# Patient Record
Sex: Female | Born: 1955 | Race: White | Hispanic: No | Marital: Married | Smoking: Former smoker
Health system: Southern US, Community
[De-identification: ages and names within clinical notes are randomized; demographics above are authoritative.]

## PROBLEM LIST (undated history)

## (undated) DIAGNOSIS — E039 Hypothyroidism, unspecified: Secondary | ICD-10-CM

## (undated) DIAGNOSIS — I2510 Atherosclerotic heart disease of native coronary artery without angina pectoris: Secondary | ICD-10-CM

## (undated) DIAGNOSIS — F32A Depression, unspecified: Secondary | ICD-10-CM

## (undated) DIAGNOSIS — E785 Hyperlipidemia, unspecified: Secondary | ICD-10-CM

## (undated) DIAGNOSIS — F329 Major depressive disorder, single episode, unspecified: Secondary | ICD-10-CM

## (undated) DIAGNOSIS — F909 Attention-deficit hyperactivity disorder, unspecified type: Secondary | ICD-10-CM

## (undated) DIAGNOSIS — C801 Malignant (primary) neoplasm, unspecified: Secondary | ICD-10-CM

## (undated) DIAGNOSIS — M8580 Other specified disorders of bone density and structure, unspecified site: Secondary | ICD-10-CM

## (undated) HISTORY — DX: Attention-deficit hyperactivity disorder, unspecified type: F90.9

## (undated) HISTORY — PX: BREAST BIOPSY: SHX20

## (undated) HISTORY — DX: Atherosclerotic heart disease of native coronary artery without angina pectoris: I25.10

## (undated) HISTORY — PX: BREAST EXCISIONAL BIOPSY: SUR124

## (undated) HISTORY — PX: APPENDECTOMY: SHX54

## (undated) HISTORY — DX: Depression, unspecified: F32.A

## (undated) HISTORY — DX: Hyperlipidemia, unspecified: E78.5

## (undated) HISTORY — PX: ABDOMINAL HYSTERECTOMY: SHX81

## (undated) HISTORY — DX: Other specified disorders of bone density and structure, unspecified site: M85.80

## (undated) HISTORY — DX: Hypothyroidism, unspecified: E03.9

## (undated) HISTORY — DX: Major depressive disorder, single episode, unspecified: F32.9

---

## 1985-06-12 HISTORY — PX: TOTAL ABDOMINAL HYSTERECTOMY W/ BILATERAL SALPINGOOPHORECTOMY: SHX83

## 1997-06-12 HISTORY — PX: CHOLECYSTECTOMY: SHX55

## 2005-02-24 ENCOUNTER — Ambulatory Visit: Payer: Self-pay | Admitting: Family Medicine

## 2005-03-23 ENCOUNTER — Ambulatory Visit: Payer: Self-pay | Admitting: Family Medicine

## 2005-07-06 ENCOUNTER — Ambulatory Visit: Payer: Self-pay | Admitting: Family Medicine

## 2005-11-30 ENCOUNTER — Ambulatory Visit: Payer: Self-pay | Admitting: Family Medicine

## 2006-05-31 ENCOUNTER — Ambulatory Visit: Payer: Self-pay | Admitting: Family Medicine

## 2006-08-23 ENCOUNTER — Ambulatory Visit: Payer: Self-pay | Admitting: Family Medicine

## 2006-08-23 LAB — CONVERTED CEMR LAB
Albumin: 4.2 g/dL (ref 3.5–5.2)
Basophils Absolute: 0.1 10*3/uL (ref 0.0–0.1)
CO2: 30 meq/L (ref 19–32)
Cholesterol: 215 mg/dL (ref 0–200)
Creatinine, Ser: 0.8 mg/dL (ref 0.4–1.2)
Direct LDL: 143.2 mg/dL
GFR calc Af Amer: 98 mL/min
HCT: 42.8 % (ref 36.0–46.0)
Hemoglobin: 14.6 g/dL (ref 12.0–15.0)
Lymphocytes Relative: 30 % (ref 12.0–46.0)
MCHC: 34.2 g/dL (ref 30.0–36.0)
Monocytes Absolute: 0.4 10*3/uL (ref 0.2–0.7)
Neutro Abs: 3.9 10*3/uL (ref 1.4–7.7)
Neutrophils Relative %: 61 % (ref 43.0–77.0)
Potassium: 3.8 meq/L (ref 3.5–5.1)
RDW: 12 % (ref 11.5–14.6)
Sodium: 142 meq/L (ref 135–145)
TSH: 0.47 microintl units/mL (ref 0.35–5.50)
Total Bilirubin: 0.5 mg/dL (ref 0.3–1.2)
Total CHOL/HDL Ratio: 4.5
Total Protein: 7.2 g/dL (ref 6.0–8.3)

## 2006-11-12 ENCOUNTER — Telehealth: Payer: Self-pay | Admitting: Family Medicine

## 2006-12-10 ENCOUNTER — Telehealth (INDEPENDENT_AMBULATORY_CARE_PROVIDER_SITE_OTHER): Payer: Self-pay | Admitting: *Deleted

## 2007-01-08 ENCOUNTER — Telehealth (INDEPENDENT_AMBULATORY_CARE_PROVIDER_SITE_OTHER): Payer: Self-pay | Admitting: *Deleted

## 2007-02-05 ENCOUNTER — Telehealth (INDEPENDENT_AMBULATORY_CARE_PROVIDER_SITE_OTHER): Payer: Self-pay | Admitting: *Deleted

## 2007-03-04 ENCOUNTER — Telehealth (INDEPENDENT_AMBULATORY_CARE_PROVIDER_SITE_OTHER): Payer: Self-pay | Admitting: *Deleted

## 2007-03-29 ENCOUNTER — Telehealth (INDEPENDENT_AMBULATORY_CARE_PROVIDER_SITE_OTHER): Payer: Self-pay | Admitting: *Deleted

## 2007-04-30 ENCOUNTER — Ambulatory Visit: Payer: Self-pay | Admitting: Family Medicine

## 2007-04-30 DIAGNOSIS — F988 Other specified behavioral and emotional disorders with onset usually occurring in childhood and adolescence: Secondary | ICD-10-CM | POA: Insufficient documentation

## 2007-04-30 DIAGNOSIS — E039 Hypothyroidism, unspecified: Secondary | ICD-10-CM | POA: Insufficient documentation

## 2007-05-07 ENCOUNTER — Encounter: Payer: Self-pay | Admitting: Family Medicine

## 2007-05-27 ENCOUNTER — Telehealth (INDEPENDENT_AMBULATORY_CARE_PROVIDER_SITE_OTHER): Payer: Self-pay | Admitting: *Deleted

## 2007-06-26 ENCOUNTER — Telehealth (INDEPENDENT_AMBULATORY_CARE_PROVIDER_SITE_OTHER): Payer: Self-pay | Admitting: *Deleted

## 2007-07-18 ENCOUNTER — Encounter: Payer: Self-pay | Admitting: Family Medicine

## 2007-07-24 ENCOUNTER — Telehealth (INDEPENDENT_AMBULATORY_CARE_PROVIDER_SITE_OTHER): Payer: Self-pay | Admitting: *Deleted

## 2007-08-20 ENCOUNTER — Other Ambulatory Visit: Admission: RE | Admit: 2007-08-20 | Discharge: 2007-08-20 | Payer: Self-pay | Admitting: Family Medicine

## 2007-08-20 ENCOUNTER — Encounter: Payer: Self-pay | Admitting: Family Medicine

## 2007-08-20 ENCOUNTER — Ambulatory Visit: Payer: Self-pay | Admitting: Family Medicine

## 2007-08-20 DIAGNOSIS — M810 Age-related osteoporosis without current pathological fracture: Secondary | ICD-10-CM

## 2007-08-21 ENCOUNTER — Encounter (INDEPENDENT_AMBULATORY_CARE_PROVIDER_SITE_OTHER): Payer: Self-pay | Admitting: *Deleted

## 2007-08-26 ENCOUNTER — Encounter (INDEPENDENT_AMBULATORY_CARE_PROVIDER_SITE_OTHER): Payer: Self-pay | Admitting: *Deleted

## 2007-09-02 ENCOUNTER — Encounter (INDEPENDENT_AMBULATORY_CARE_PROVIDER_SITE_OTHER): Payer: Self-pay | Admitting: *Deleted

## 2007-09-17 ENCOUNTER — Telehealth (INDEPENDENT_AMBULATORY_CARE_PROVIDER_SITE_OTHER): Payer: Self-pay | Admitting: *Deleted

## 2007-09-17 ENCOUNTER — Encounter: Payer: Self-pay | Admitting: Family Medicine

## 2007-09-17 ENCOUNTER — Ambulatory Visit: Payer: Self-pay | Admitting: Internal Medicine

## 2007-09-27 ENCOUNTER — Telehealth (INDEPENDENT_AMBULATORY_CARE_PROVIDER_SITE_OTHER): Payer: Self-pay | Admitting: *Deleted

## 2007-10-21 ENCOUNTER — Telehealth (INDEPENDENT_AMBULATORY_CARE_PROVIDER_SITE_OTHER): Payer: Self-pay | Admitting: *Deleted

## 2007-11-18 ENCOUNTER — Telehealth (INDEPENDENT_AMBULATORY_CARE_PROVIDER_SITE_OTHER): Payer: Self-pay | Admitting: *Deleted

## 2007-12-11 ENCOUNTER — Encounter: Payer: Self-pay | Admitting: Family Medicine

## 2007-12-17 ENCOUNTER — Telehealth (INDEPENDENT_AMBULATORY_CARE_PROVIDER_SITE_OTHER): Payer: Self-pay | Admitting: *Deleted

## 2008-01-15 ENCOUNTER — Telehealth (INDEPENDENT_AMBULATORY_CARE_PROVIDER_SITE_OTHER): Payer: Self-pay | Admitting: *Deleted

## 2008-02-10 ENCOUNTER — Telehealth (INDEPENDENT_AMBULATORY_CARE_PROVIDER_SITE_OTHER): Payer: Self-pay | Admitting: *Deleted

## 2008-03-05 ENCOUNTER — Telehealth (INDEPENDENT_AMBULATORY_CARE_PROVIDER_SITE_OTHER): Payer: Self-pay | Admitting: *Deleted

## 2008-04-29 ENCOUNTER — Telehealth (INDEPENDENT_AMBULATORY_CARE_PROVIDER_SITE_OTHER): Payer: Self-pay | Admitting: *Deleted

## 2008-05-13 ENCOUNTER — Ambulatory Visit: Payer: Self-pay | Admitting: Family Medicine

## 2008-05-13 DIAGNOSIS — M7710 Lateral epicondylitis, unspecified elbow: Secondary | ICD-10-CM | POA: Insufficient documentation

## 2008-06-15 ENCOUNTER — Ambulatory Visit: Payer: Self-pay | Admitting: Family Medicine

## 2008-06-29 ENCOUNTER — Encounter: Payer: Self-pay | Admitting: Family Medicine

## 2008-08-17 ENCOUNTER — Telehealth: Payer: Self-pay | Admitting: Family Medicine

## 2008-08-24 ENCOUNTER — Ambulatory Visit: Payer: Self-pay | Admitting: Family Medicine

## 2008-08-26 ENCOUNTER — Encounter (INDEPENDENT_AMBULATORY_CARE_PROVIDER_SITE_OTHER): Payer: Self-pay | Admitting: *Deleted

## 2008-09-09 ENCOUNTER — Telehealth (INDEPENDENT_AMBULATORY_CARE_PROVIDER_SITE_OTHER): Payer: Self-pay | Admitting: *Deleted

## 2008-10-05 ENCOUNTER — Telehealth (INDEPENDENT_AMBULATORY_CARE_PROVIDER_SITE_OTHER): Payer: Self-pay | Admitting: *Deleted

## 2008-10-09 ENCOUNTER — Telehealth (INDEPENDENT_AMBULATORY_CARE_PROVIDER_SITE_OTHER): Payer: Self-pay | Admitting: *Deleted

## 2008-11-10 ENCOUNTER — Telehealth (INDEPENDENT_AMBULATORY_CARE_PROVIDER_SITE_OTHER): Payer: Self-pay | Admitting: *Deleted

## 2008-12-09 ENCOUNTER — Telehealth (INDEPENDENT_AMBULATORY_CARE_PROVIDER_SITE_OTHER): Payer: Self-pay | Admitting: *Deleted

## 2009-01-06 ENCOUNTER — Telehealth (INDEPENDENT_AMBULATORY_CARE_PROVIDER_SITE_OTHER): Payer: Self-pay | Admitting: *Deleted

## 2009-01-22 ENCOUNTER — Telehealth: Payer: Self-pay | Admitting: Family Medicine

## 2009-01-22 ENCOUNTER — Emergency Department (HOSPITAL_COMMUNITY): Admission: EM | Admit: 2009-01-22 | Discharge: 2009-01-22 | Payer: Self-pay | Admitting: Emergency Medicine

## 2009-01-22 ENCOUNTER — Encounter: Payer: Self-pay | Admitting: Cardiovascular Disease

## 2009-01-27 ENCOUNTER — Ambulatory Visit: Payer: Self-pay | Admitting: Cardiovascular Disease

## 2009-01-27 ENCOUNTER — Encounter (INDEPENDENT_AMBULATORY_CARE_PROVIDER_SITE_OTHER): Payer: Self-pay | Admitting: *Deleted

## 2009-01-27 DIAGNOSIS — R42 Dizziness and giddiness: Secondary | ICD-10-CM

## 2009-01-27 DIAGNOSIS — R072 Precordial pain: Secondary | ICD-10-CM

## 2009-01-27 DIAGNOSIS — F17200 Nicotine dependence, unspecified, uncomplicated: Secondary | ICD-10-CM

## 2009-01-27 DIAGNOSIS — R079 Chest pain, unspecified: Secondary | ICD-10-CM

## 2009-01-27 DIAGNOSIS — R002 Palpitations: Secondary | ICD-10-CM

## 2009-01-28 ENCOUNTER — Encounter: Payer: Self-pay | Admitting: Cardiovascular Disease

## 2009-02-02 ENCOUNTER — Telehealth (INDEPENDENT_AMBULATORY_CARE_PROVIDER_SITE_OTHER): Payer: Self-pay | Admitting: *Deleted

## 2009-02-04 ENCOUNTER — Encounter: Payer: Self-pay | Admitting: Cardiovascular Disease

## 2009-02-04 ENCOUNTER — Ambulatory Visit: Payer: Self-pay

## 2009-02-04 ENCOUNTER — Ambulatory Visit: Payer: Self-pay | Admitting: Cardiology

## 2009-02-05 ENCOUNTER — Encounter: Payer: Self-pay | Admitting: Cardiology

## 2009-02-05 ENCOUNTER — Telehealth (INDEPENDENT_AMBULATORY_CARE_PROVIDER_SITE_OTHER): Payer: Self-pay | Admitting: *Deleted

## 2009-02-05 ENCOUNTER — Ambulatory Visit: Payer: Self-pay | Admitting: Cardiovascular Disease

## 2009-02-09 ENCOUNTER — Encounter: Payer: Self-pay | Admitting: Cardiovascular Disease

## 2009-02-09 ENCOUNTER — Ambulatory Visit: Payer: Self-pay

## 2009-02-12 ENCOUNTER — Encounter (INDEPENDENT_AMBULATORY_CARE_PROVIDER_SITE_OTHER): Payer: Self-pay | Admitting: *Deleted

## 2009-02-12 ENCOUNTER — Ambulatory Visit: Payer: Self-pay | Admitting: Cardiovascular Disease

## 2009-02-12 DIAGNOSIS — R9439 Abnormal result of other cardiovascular function study: Secondary | ICD-10-CM | POA: Insufficient documentation

## 2009-02-16 ENCOUNTER — Inpatient Hospital Stay (HOSPITAL_BASED_OUTPATIENT_CLINIC_OR_DEPARTMENT_OTHER): Admission: RE | Admit: 2009-02-16 | Discharge: 2009-02-16 | Payer: Self-pay | Admitting: Cardiovascular Disease

## 2009-02-16 ENCOUNTER — Ambulatory Visit: Payer: Self-pay | Admitting: Cardiovascular Disease

## 2009-02-16 LAB — CONVERTED CEMR LAB
Calcium: 9.9 mg/dL (ref 8.4–10.5)
Chloride: 105 meq/L (ref 96–112)
Creatinine, Ser: 0.75 mg/dL (ref 0.40–1.20)
Free T4: 0.81 ng/dL (ref 0.80–1.80)
Hemoglobin: 14 g/dL (ref 12.0–15.0)
INR: 1 (ref 0.0–1.5)
MCHC: 31.7 g/dL (ref 30.0–36.0)
Prothrombin Time: 12.6 s (ref 11.6–15.2)
RBC: 5 M/uL (ref 3.87–5.11)
RDW: 14.2 % (ref 11.5–15.5)
Sodium: 143 meq/L (ref 135–145)
T3, Free: 3.2 pg/mL (ref 2.3–4.2)

## 2009-02-23 ENCOUNTER — Telehealth: Payer: Self-pay | Admitting: Family Medicine

## 2009-02-24 ENCOUNTER — Ambulatory Visit: Payer: Self-pay | Admitting: Cardiovascular Disease

## 2009-02-24 DIAGNOSIS — R0602 Shortness of breath: Secondary | ICD-10-CM | POA: Insufficient documentation

## 2009-03-30 ENCOUNTER — Encounter (INDEPENDENT_AMBULATORY_CARE_PROVIDER_SITE_OTHER): Payer: Self-pay | Admitting: *Deleted

## 2009-03-30 ENCOUNTER — Ambulatory Visit: Payer: Self-pay | Admitting: Family Medicine

## 2009-03-30 DIAGNOSIS — E559 Vitamin D deficiency, unspecified: Secondary | ICD-10-CM

## 2009-03-31 ENCOUNTER — Encounter (INDEPENDENT_AMBULATORY_CARE_PROVIDER_SITE_OTHER): Payer: Self-pay | Admitting: *Deleted

## 2009-03-31 LAB — CONVERTED CEMR LAB: Vit D, 25-Hydroxy: 41 ng/mL (ref 30–89)

## 2009-04-01 ENCOUNTER — Encounter: Payer: Self-pay | Admitting: Family Medicine

## 2009-04-08 ENCOUNTER — Ambulatory Visit: Payer: Self-pay | Admitting: Internal Medicine

## 2009-04-09 ENCOUNTER — Encounter: Payer: Self-pay | Admitting: Family Medicine

## 2009-04-22 ENCOUNTER — Telehealth (INDEPENDENT_AMBULATORY_CARE_PROVIDER_SITE_OTHER): Payer: Self-pay | Admitting: *Deleted

## 2009-06-18 ENCOUNTER — Ambulatory Visit: Payer: Self-pay | Admitting: Family Medicine

## 2009-06-18 DIAGNOSIS — M545 Low back pain, unspecified: Secondary | ICD-10-CM | POA: Insufficient documentation

## 2009-06-18 DIAGNOSIS — M2550 Pain in unspecified joint: Secondary | ICD-10-CM | POA: Insufficient documentation

## 2009-06-18 LAB — CONVERTED CEMR LAB
Ketones, urine, test strip: NEGATIVE
Nitrite: NEGATIVE
Urobilinogen, UA: 0.2
WBC Urine, dipstick: NEGATIVE

## 2009-06-22 ENCOUNTER — Encounter (INDEPENDENT_AMBULATORY_CARE_PROVIDER_SITE_OTHER): Payer: Self-pay | Admitting: *Deleted

## 2009-06-22 LAB — CONVERTED CEMR LAB: Anti Nuclear Antibody(ANA): NEGATIVE

## 2009-06-24 ENCOUNTER — Telehealth: Payer: Self-pay | Admitting: Family Medicine

## 2009-06-24 ENCOUNTER — Ambulatory Visit: Payer: Self-pay | Admitting: Family Medicine

## 2009-06-24 LAB — CONVERTED CEMR LAB
ALT: 17 units/L (ref 0–35)
AST: 21 units/L (ref 0–37)
Albumin: 3.9 g/dL (ref 3.5–5.2)
Alkaline Phosphatase: 106 units/L (ref 39–117)
Basophils Absolute: 0.1 10*3/uL (ref 0.0–0.1)
Calcium: 9.4 mg/dL (ref 8.4–10.5)
Direct LDL: 170.9 mg/dL
Eosinophils Relative: 2.1 % (ref 0.0–5.0)
GFR calc non Af Amer: 93.37 mL/min (ref >60)
HDL: 41.8 mg/dL (ref >39.00)
Hemoglobin: 13.5 g/dL (ref 12.0–15.0)
Lymphocytes Relative: 28.7 % (ref 12.0–46.0)
Monocytes Relative: 7.8 % (ref 3.0–12.0)
Neutro Abs: 4.5 10*3/uL (ref 1.4–7.7)
RBC: 4.62 M/uL (ref 3.87–5.11)
RDW: 12.1 % (ref 11.5–14.6)
Sodium: 139 meq/L (ref 135–145)
Total CHOL/HDL Ratio: 6
Triglycerides: 145 mg/dL (ref 0.0–149.0)
VLDL: 29 mg/dL (ref 0.0–40.0)
WBC: 7.6 10*3/uL (ref 4.5–10.5)

## 2009-06-25 ENCOUNTER — Telehealth (INDEPENDENT_AMBULATORY_CARE_PROVIDER_SITE_OTHER): Payer: Self-pay | Admitting: *Deleted

## 2009-06-26 ENCOUNTER — Encounter: Admission: RE | Admit: 2009-06-26 | Discharge: 2009-06-26 | Payer: Self-pay | Admitting: Family Medicine

## 2009-06-28 ENCOUNTER — Telehealth (INDEPENDENT_AMBULATORY_CARE_PROVIDER_SITE_OTHER): Payer: Self-pay | Admitting: *Deleted

## 2009-07-07 ENCOUNTER — Encounter: Payer: Self-pay | Admitting: Family Medicine

## 2009-07-08 ENCOUNTER — Telehealth (INDEPENDENT_AMBULATORY_CARE_PROVIDER_SITE_OTHER): Payer: Self-pay | Admitting: *Deleted

## 2009-07-19 ENCOUNTER — Telehealth (INDEPENDENT_AMBULATORY_CARE_PROVIDER_SITE_OTHER): Payer: Self-pay | Admitting: *Deleted

## 2009-09-08 ENCOUNTER — Encounter: Payer: Self-pay | Admitting: Internal Medicine

## 2009-09-27 ENCOUNTER — Ambulatory Visit: Payer: Self-pay | Admitting: Internal Medicine

## 2009-09-27 DIAGNOSIS — R935 Abnormal findings on diagnostic imaging of other abdominal regions, including retroperitoneum: Secondary | ICD-10-CM

## 2009-11-09 ENCOUNTER — Encounter: Payer: Self-pay | Admitting: Internal Medicine

## 2009-11-09 ENCOUNTER — Ambulatory Visit: Payer: Self-pay

## 2009-11-16 ENCOUNTER — Encounter: Payer: Self-pay | Admitting: Internal Medicine

## 2009-11-16 ENCOUNTER — Telehealth: Payer: Self-pay | Admitting: Internal Medicine

## 2009-11-16 DIAGNOSIS — I700 Atherosclerosis of aorta: Secondary | ICD-10-CM | POA: Insufficient documentation

## 2009-11-18 ENCOUNTER — Ambulatory Visit: Payer: Self-pay

## 2009-11-18 ENCOUNTER — Encounter: Payer: Self-pay | Admitting: Internal Medicine

## 2010-02-07 ENCOUNTER — Encounter: Payer: Self-pay | Admitting: Family Medicine

## 2010-02-23 ENCOUNTER — Encounter: Payer: Self-pay | Admitting: Family Medicine

## 2010-06-29 ENCOUNTER — Ambulatory Visit: Payer: Self-pay | Admitting: Otolaryngology

## 2010-07-10 LAB — CONVERTED CEMR LAB
ALT: 18 units/L (ref 0–35)
AST: 22 units/L (ref 0–37)
Alkaline Phosphatase: 102 units/L (ref 39–117)
BUN: 10 mg/dL (ref 6–23)
Basophils Relative: 0 % (ref 0.0–1.0)
Bilirubin, Direct: 0.1 mg/dL (ref 0.0–0.3)
Bilirubin, Direct: 0.1 mg/dL (ref 0.0–0.3)
CO2: 30 meq/L (ref 19–32)
CO2: 31 meq/L (ref 19–32)
Calcium: 9.5 mg/dL (ref 8.4–10.5)
Cholesterol: 218 mg/dL (ref 0–200)
Direct LDL: 143.1 mg/dL
Eosinophils Absolute: 0 10*3/uL (ref 0.0–0.6)
Eosinophils Relative: 0.6 % (ref 0.0–5.0)
Eosinophils Relative: 1.6 % (ref 0.0–5.0)
GFR calc Af Amer: 97 mL/min
GFR calc Af Amer: 98 mL/min
GFR calc non Af Amer: 81 mL/min
Glucose, Bld: 89 mg/dL (ref 70–99)
Glucose, Bld: 95 mg/dL (ref 70–99)
HCT: 39 % (ref 36.0–46.0)
HCT: 39.8 % (ref 36.0–46.0)
Hemoglobin: 13.3 g/dL (ref 12.0–15.0)
Lymphocytes Relative: 18.3 % (ref 12.0–46.0)
MCHC: 33.7 g/dL (ref 30.0–36.0)
MCV: 85.7 fL (ref 78.0–100.0)
Monocytes Absolute: 0.6 10*3/uL (ref 0.1–1.0)
Monocytes Absolute: 0.8 10*3/uL — ABNORMAL HIGH (ref 0.2–0.7)
Monocytes Relative: 11 % (ref 3.0–12.0)
Neutro Abs: 5.2 10*3/uL (ref 1.4–7.7)
Neutrophils Relative %: 77.5 % — ABNORMAL HIGH (ref 43.0–77.0)
Platelets: 265 10*3/uL (ref 150–400)
Platelets: 284 10*3/uL (ref 150–400)
Potassium: 3.9 meq/L (ref 3.5–5.1)
RBC: 4.55 M/uL (ref 3.87–5.11)
RBC: 4.55 M/uL (ref 3.87–5.11)
Sodium: 140 meq/L (ref 135–145)
T3, Free: 2.5 pg/mL (ref 2.3–4.2)
Total Bilirubin: 0.5 mg/dL (ref 0.3–1.2)
Total CHOL/HDL Ratio: 4.9
Total Protein: 7 g/dL (ref 6.0–8.3)
Total Protein: 7.3 g/dL (ref 6.0–8.3)
Triglycerides: 203 mg/dL (ref 0–149)
WBC: 5.1 10*3/uL (ref 4.5–10.5)
WBC: 6.7 10*3/uL (ref 4.5–10.5)

## 2010-07-12 NOTE — Miscellaneous (Signed)
Summary: Orders Update  Clinical Lists Changes  Problems: Added new problem of AORTIC STENOSIS, CALCIFIC (ICD-424.1) Orders: Added new Test order of Arterial Duplex Upper Extremity (Arterial Duplex Up ) - Signed Added new Test order of Arterial Duplex Lower Extremity (Arterial Duplex Low) - Signed

## 2010-07-12 NOTE — Assessment & Plan Note (Signed)
Summary: rov   Primary Provider:  Loreen Freud DO  CC:  No Cardiac Complaints; question ref: abd. aorta calcification.  History of Present Illness: Denise Mason is a 55 yo WF with history of ongoing tobacco use, hyperlipidemia, depression. Presents today to discuss abdominal aortic calcification seen on plain x-ray for her back   Previously seen by Dr. Clifton James. Had abnormal stresss test and underwent left heart cath onm 02/16/09 and showed mild CAD with luminal irregularities in the mid LAD and mid RCA. The stress test was felt to be a false positive.   Has not had any cardiac complaints but having terrible back problems. had palin film of her back which made incidental note of abdominal aortic calcifications. Deneis abdominal pain or claudication. No embolic phenomenon. Still smoking. Not taking statin that was prescribed previously.  Current Medications (verified): 1)  Synthroid 50 Mcg Tabs (Levothyroxine Sodium) .Marland Kitchen.. 1 By Mouth Once Daily (Usually Every Other Day ) 2)  Ativan 0.5 Mg Tabs (Lorazepam) .Marland Kitchen.. 1 By Mouth At Bedtime As Needed 3)  Zocor 20 Mg Tabs (Simvastatin) .Marland Kitchen.. 1 By Mouth At Bedtime.(Has Not Started Yet) 4)  Diclofenac Sodium 50 Mg Tbec (Diclofenac Sodium) .... Take 1 By Mouth Every 8 Hours With Food. 5)  Omeprazole 20 Mg Cpdr (Omeprazole) .... Take 2 By Mouth Once Daily  Allergies: 1)  ! Asa 2)  ! Wellbutrin Xl (Bupropion Hcl) 3)  ! 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   Vital Signs:  Patient profile:   55 year old female Menstrual status:  hysterectomy Height:      60 inches Weight:      131.75 pounds BMI:     25.82 Pulse rate:   90 / minute BP sitting:   132 / 84  (left  arm)  Vitals Entered By: [name], EMT-P (September 27, 2009 2:14 PM)  Physical Exam  General:  Gen: well appearing. no resp difficulty HEENT: normal Neck: supple. no JVD. Carotids 2+ bilat; no bruits. No lymphadenopathy or thryomegaly appreciated. Cor: PMI nondisplaced. Regular rate & rhythm. No rubs, gallops, murmur. Lungs: clear Abdomen: soft, nontender, nondistended.  No bruits or masses. Good bowel sounds. Extremities: no cyanosis, clubbing, rash, edema. DPS 1+ bilaterally Neuro: alert & orientedx3, cranial nerves grossly intact. moves all 4 extremities w/o difficulty. affect pleasant    Impression & Recommendations:  Problem # 1:  ABDOMINAL XRAY, ABNORMAL (ICD-793.6) Suspect this is just mild aortic calcification. However given smoking history with get ab u/s to exclude aneurysm or signifcant stenosis. Counseled on need to stop smoking. Suggested she start statin with goal LDL < 70.   Other Orders: Abdominal Aorta Duplex (Abd Aorta Duplex)  Patient Instructions: 1)  Your physician recommends that you schedule a follow-up appointment as needed. 2)  Your physician has requested that you have an abdominal aorta duplex. During this test, an ultrasound is used to evaluate the aorta. Allow 30 minutes for this exam. Do not eat after midnight the day before and avoid carbonated beverages. There are no restrictions or special instructions.

## 2010-07-12 NOTE — Assessment & Plan Note (Signed)
Summary: TO TALK TO HER ABOUT ISSUES SHE HAS//PH   Vital Signs:  Patient profile:   55 year old female Menstrual status:  hysterectomy Weight:      137 pounds Temp:     98.2 degrees F oral Pulse rate:   80 / minute Pulse rhythm:   regular BP sitting:   118 / 80  (left arm) Cuff size:   regular  Vitals Entered By: Army Fossa CMA (June 18, 2009 1:55 PM) CC: Discuss joint pains, having lower back pain urinating more frequently. , Back Pain   History of Present Illness:       This is a 55 year old woman who presents with Back Pain.  The symptoms began 3 days ago.  The patient denies fever, chills, weakness, loss of sensation, fecal incontinence, urinary incontinence, urinary retention, dysuria, rest pain, inability to work, and inability to care for self.  The pain is located in the left low back.  The pain began at home and gradually.  The pain radiates to the left buttock.  The pain is made worse by lying down.  The pain is made better by NSAID medications.  No known injury.     Pt also c/o mult joint pains and swelling in hands.  Her jewelry is tight.    Current Medications (verified): 1)  Synthroid 50 Mcg Tabs (Levothyroxine Sodium) .Marland Kitchen.. 1 By Mouth Once Daily 2)  Ativan 0.5 Mg Tabs (Lorazepam) .Marland Kitchen.. 1 By Mouth At Bedtime As Needed 3)  Mobic 15 Mg Tabs (Meloxicam) .... 1/2 -1 By Mouth Once Daily 4)  Concerta 27 Mg Cr-Tabs (Methylphenidate Hcl) .Marland Kitchen.. 1 By Mouth Qam 5)  Flexeril 10 Mg Tabs (Cyclobenzaprine Hcl) .Marland Kitchen.. 1 By Mouth Three Times A Day As Needed  Allergies (verified): 1)  ! Asa 2)  ! Wellbutrin Xl (Bupropion Hcl) 3)  ! Chantix (Varenicline Tartrate)  Past History:  Past medical, surgical, family and social histories (including risk factors) reviewed for relevance to current acute and chronic problems.  Past Medical History: Reviewed history from 02/24/2009 and no changes required. Hypothyroidism ADD Osteopenia Depression Hyperlipidemia Mild Non-obstructive  CAD by cath 02/16/09 (Luminal irreg mid LAD and mid RCA)  Past Surgical History: Reviewed history from 01/27/2009 and no changes required. Hysterectomy (1987)  TAH BSO Cholecystectomy (1999) Appendectomy Lumpectomy- left breast  Family History: Reviewed history from 01/27/2009 and no changes required. mother:deceased, died from breast cancer father:living, DM, HTN 1 sister:living Family History Hypertension: father heart:MGm with CAD/MI/conductiion system disease  Uncles maternal with CAD  Paternal GM with CVA  Family History Breast cancer 1st degree relative <50: mother, sister  Family History Depression: sister  Social History: Reviewed history from 01/27/2009 and no changes required. Married, 2 grown children Current Smoker 1ppd for 30 years Drug use-no Alcohol use-no Regular exercise-no  Review of Systems      See HPI  Physical Exam  General:  Well-developed,well-nourished,in no acute distress; alert,appropriate and cooperative throughout examination Msk:  normal ROM, no joint tenderness, no joint warmth, no redness over joints, no joint deformities, no joint instability, and no crepitation.   Extremities:  No clubbing, cyanosis, edema, or deformity noted with normal full range of motion of all joints.   Neurologic:  alert & oriented X3, gait normal, and DTRs symmetrical and normal.   + weakness in L foot with plantar flexion Skin:  Intact without suspicious lesions or rashes Psych:  Oriented X3 and normally interactive.     Impression & Recommendations:  Problem # 1:  PAIN IN JOINT, MULTIPLE SITES (ICD-719.49)  Orders: Venipuncture (69629) TLB-Lipid Panel (80061-LIPID) TLB-BMP (Basic Metabolic Panel-BMET) (80048-METABOL) TLB-CBC Platelet - w/Differential (85025-CBCD) TLB-Hepatic/Liver Function Pnl (80076-HEPATIC) TLB-TSH (Thyroid Stimulating Hormone) (84443-TSH) TLB-Rheumatoid Factor (RA) (52841-LK) TLB-Sedimentation Rate (ESR) (85652-ESR) T-Antinuclear  Antib (ANA) [phone]) T-Vitamin D (25-Hydroxy) (66440-34742)  Problem # 2:  LOW BACK PAIN, ACUTE (ICD-724.2)  Her updated medication list for this problem includes:    Mobic 15 Mg Tabs (Meloxicam) .Marland Kitchen... 1/2 -1 by mouth once daily    Flexeril 10 Mg Tabs (Cyclobenzaprine hcl) .Marland Kitchen... 1 by mouth three times a day as needed  Orders: Venipuncture (59563) TLB-Lipid Panel (80061-LIPID) TLB-BMP (Basic Metabolic Panel-BMET) (80048-METABOL) TLB-CBC Platelet - w/Differential (85025-CBCD) TLB-Hepatic/Liver Function Pnl (80076-HEPATIC) TLB-TSH (Thyroid Stimulating Hormone) (84443-TSH) TLB-Rheumatoid Factor (RA) (87564-PP) TLB-Sedimentation Rate (ESR) (85652-ESR) T-Antinuclear Antib (ANA) [id number]) T-Vitamin D (25-Hydroxy) (06301-60109) T-Lumbar Spine 2 Views (72100TC)  Problem # 3:  UNSPECIFIED VITAMIN D DEFICIENCY (ICD-268.9)  Orders: TLB-Rheumatoid Factor (RA) (32355-DD) TLB-Sedimentation Rate (ESR) (85652-ESR) T-Antinuclear Antib (ANA) (22025-42706) T-Vitamin D (25-Hydroxy) (23762-83151)  Problem # 4:  ADD (ICD-314.00) concerta 27 mg 1 by mouth once daily   Complete Medication List: 1)  Synthroid 50 Mcg Tabs (Levothyroxine sodium) .Marland Kitchen.. 1 by mouth once daily 2)  Ativan 0.5 Mg Tabs (Lorazepam) .Marland Kitchen.. 1 by mouth at bedtime as needed 3)  Mobic 15 Mg Tabs (Meloxicam) .... 1/2 -1 by mouth once daily 4)  Concerta 27 Mg Cr-tabs (Methylphenidate hcl) .Marland Kitchen.. 1 by mouth qam 5)  Flexeril 10 Mg Tabs (Cyclobenzaprine hcl) .Marland Kitchen.. 1 by mouth three times a day as needed Prescriptions: FLEXERIL 10 MG TABS (CYCLOBENZAPRINE HCL) 1 by mouth three times a day as needed  #30 x 0   Entered and Authorized by:   Loreen Freud DO   Signed by:   Loreen Freud DO on 06/18/2009   Method used:   Electronically to        [facility] [address] [phone]* (retail)       [address] [place], [place]  [address]       Ph: [phone]       Fax: [phone]   RxID:   [id number] CONCERTA 27 MG CR-TABS  (METHYLPHENIDATE HCL) 1 by mouth qam  #30 x 0   Entered and Authorized by:   Loreen Freud DO   Signed by:   Loreen Freud DO on 06/18/2009   Method used:   Print then Give to Patient   RxID:   [id number] MOBIC 15 MG TABS (MELOXICAM) 1/2 -1 by mouth once daily  #30 x 2   Entered and Authorized by:   Loreen Freud DO   Signed by:   Loreen Freud DO on 06/18/2009   Method used:   Electronically to        [facility] [address] [phone]* (retail)       [address] [place], [place]  [address]       Ph: [phone]       Fax: [phone]   RxID:   [id number]   Laboratory Results   Urine Tests    Routine Urinalysis   Color: yellow Appearance: Clear Glucose: negative   (Normal Range: Negative) Bilirubin: negative   (Normal Range: Negative) Ketone: negative   (Normal Range: Negative) Spec. Gravity: 1.015   (Normal Range: 1.003-1.035) Blood: trace-lysed   (Normal Range: Negative) pH: 6.0   (Normal Range: 5.0-8.0) Protein: negative   (  Normal Range: Negative) Urobilinogen: 0.2   (Normal Range: 0-1) Nitrite: negative   (Normal Range: Negative) Leukocyte Esterace: negative   (Normal Range: Negative)    Comments: Army Fossa CMA  June 18, 2009 2:01 PM

## 2010-07-12 NOTE — Progress Notes (Signed)
  Phone Note Refill Request   Refills Requested: Medication #1:  CONCERTA 27 MG CR-TABS 1 by mouth qam  Follow-up for Phone Call        left message for pt- rx is up front. Army Fossa CMA  July 19, 2009 8:10 AM     Prescriptions: CONCERTA 27 MG CR-TABS (METHYLPHENIDATE HCL) 1 by mouth qam  #30 x 0   Entered by:   Army Fossa CMA   Authorized by:   Loreen Freud DO   Signed by:   Army Fossa CMA on 07/19/2009   Method used:   Print then Give to Patient   RxID:   [id number]

## 2010-07-12 NOTE — Progress Notes (Signed)
Summary: TEST RESULTS  Phone Note Call from Patient Call back at [phone]   Caller: SELF Call For: Denise Mason Summary of Call: PT WOULD LIKE RESULTS FROM TEST ON 5/31 Initial call taken by: Harlon Flor,  November 16, 2009 8:45 AM  Follow-up for Phone Call        Spoke to pt about Dr. Prescott Gum recommendations. Informed her that the scheduler will call her for the ABI's  Follow-up by: Benedict Needy, RN,  November 16, 2009 12:46 PM

## 2010-07-12 NOTE — Progress Notes (Signed)
Summary: Results   Phone Note Outgoing Call   Summary of Call: Regarding results, LMTCB:  no disc herniation refer to ortho Initial call taken by: Army Fossa CMA,  June 28, 2009 9:42 AM  Follow-up for Phone Call        pt is aware. Army Fossa CMA  June 28, 2009 11:23 AM

## 2010-07-12 NOTE — Letter (Signed)
Summary: Fax from Patient Regarding MRI & Pain  Fax from Patient Regarding MRI & Pain   Imported By: Lanelle Bal 07/14/2009 10:30:06  _____________________________________________________________________  External Attachment:    Type:   Image     Comment:   External Document

## 2010-07-12 NOTE — Letter (Signed)
Summary: Results Follow up Letter  [facility]  [address] [place], [place] [address]   Phone: [phone]  Fax: [phone]    06/22/2009 MRN: [id number]  [facility] [place] [address] [place], [place]  [address]  Dear Ms. Swindle,  The following are the results of your recent test(s):  Test         Result    Pap Smear:        Normal _____  Not Normal _____ Comments: ______________________________________________________ Cholesterol: LDL(Bad cholesterol):         Your goal is less than:         HDL (Good cholesterol):       Your goal is more than: Comments:  ______________________________________________________ Mammogram:        Normal _____  Not Normal _____ Comments:  ___________________________________________________________________ Hemoccult:        Normal _____  Not normal _______ Comments:    _____________________________________________________________________ Other Tests:  See attachment for results.   We routinely do not discuss normal results over the telephone.  If you desire a copy of the results, or you have any questions about this information we can discuss them at your next office visit.   Sincerely,    Army Fossa CMA  June 22, 2009 11:28 AM

## 2010-07-12 NOTE — Progress Notes (Signed)
Summary: letter/meds  Phone Note Call from Patient   Caller: Patient Summary of Call: Pt sent in letter regarding her MRI- she would like something for pain per Dr. Laury Axon we could try Ultram. Pt was okay with this. Sent in meds. Army Fossa CMA  July 08, 2009 2:24 PM     New/Updated Medications: ULTRAM 50 MG TABS (TRAMADOL HCL) 1-2 by mouth every 6 hours as needed. Prescriptions: ULTRAM 50 MG TABS (TRAMADOL HCL) 1-2 by mouth every 6 hours as needed.  #60 x 0   Entered by:   Army Fossa CMA   Authorized by:   Loreen Freud DO   Signed by:   Army Fossa CMA on 07/08/2009   Method used:   Electronically to        [facility] [address] [phone]* (retail)       [address] [place]  [address]       Ph: [phone]       Fax: [phone]   RxID:   [id number]

## 2010-07-12 NOTE — Progress Notes (Signed)
Summary: MRI(lmom 1/14)  Phone Note Call from Patient Call back at [phone]   Complaint: Breathing Problems Summary of Call: Pt called and stated that the pain is getting worse this morning she can hardly move. She would like to have the MRI scheduled now. Is this okay? Army Fossa CMA  June 25, 2009 7:52 AM  Follow-up for Phone Call        we can try Follow-up by: Loreen Freud DO,  June 25, 2009 12:08 PM  Additional Follow-up for Phone Call Additional follow up Details #1::        LMTCB. Army Fossa CMA  June 25, 2009 12:10 PM    Additional Follow-up for Phone Call Additional follow up Details #2::    PT HAD MRI ON 06-26-2009. Magdalen Spatz [facility]  June 28, 2009 8:35 AM

## 2010-07-12 NOTE — Progress Notes (Signed)
Summary: X-ray reports  Phone Note Call from Patient   Summary of Call: Pt called and stated that she is in a lot of pain, she cannot sit or lay because the pain is so bad. Would you please review her xray reports. Army Fossa CMA  June 24, 2009 11:49 AM  Follow-up for Phone Call        xrays normal--just showed a little bit of constipation Has pain worsened?  Is it radiating down leg?   If yes we can try to get an MRI--- if it is the same ---try PT first.  Follow-up by: Loreen Freud DO,  June 24, 2009 12:25 PM  Additional Follow-up for Phone Call Additional follow up Details #1::        Pt states the pain is getting worse but it is in the same spot. She is okay with trying to do PT first.  Additional Follow-up by: Army Fossa CMA,  June 24, 2009 1:05 PM

## 2010-09-18 LAB — CBC
HCT: 39.9 % (ref 36.0–46.0)
Hemoglobin: 13.9 g/dL (ref 12.0–15.0)
MCV: 85.8 fL (ref 78.0–100.0)
Platelets: 321 10*3/uL (ref 150–400)
WBC: 9.3 10*3/uL (ref 4.0–10.5)

## 2010-09-18 LAB — POCT CARDIAC MARKERS
CKMB, poc: 1.2 ng/mL (ref 1.0–8.0)
Myoglobin, poc: 86.2 ng/mL (ref 12–200)
Troponin i, poc: <0.05 ng/mL (ref 0.00–0.09)
Troponin i, poc: <0.05 ng/mL (ref 0.00–0.09)

## 2010-09-18 LAB — POCT I-STAT, CHEM 8
BUN: 8 mg/dL (ref 6–23)
Calcium, Ion: 1.17 mmol/L (ref 1.12–1.32)
Creatinine, Ser: 0.8 mg/dL (ref 0.4–1.2)
Glucose, Bld: 98 mg/dL (ref 70–99)
TCO2: 24 mmol/L (ref 0–100)

## 2010-10-27 ENCOUNTER — Ambulatory Visit: Payer: Self-pay | Admitting: Family Medicine

## 2010-12-02 ENCOUNTER — Encounter: Payer: Self-pay | Admitting: Cardiovascular Disease

## 2011-08-22 ENCOUNTER — Ambulatory Visit: Payer: Self-pay | Admitting: Otolaryngology

## 2014-05-26 ENCOUNTER — Ambulatory Visit: Payer: Self-pay | Admitting: Obstetrics and Gynecology

## 2014-07-07 ENCOUNTER — Ambulatory Visit: Payer: Self-pay | Admitting: Family Medicine

## 2014-11-17 ENCOUNTER — Telehealth: Payer: Self-pay | Admitting: Family Medicine

## 2014-11-17 ENCOUNTER — Other Ambulatory Visit: Payer: Self-pay | Admitting: Family Medicine

## 2014-11-17 DIAGNOSIS — E559 Vitamin D deficiency, unspecified: Secondary | ICD-10-CM

## 2014-11-17 DIAGNOSIS — Z79899 Other long term (current) drug therapy: Secondary | ICD-10-CM

## 2014-11-17 DIAGNOSIS — E039 Hypothyroidism, unspecified: Secondary | ICD-10-CM

## 2014-11-17 DIAGNOSIS — Z13220 Encounter for screening for lipoid disorders: Secondary | ICD-10-CM | POA: Insufficient documentation

## 2014-11-17 NOTE — Telephone Encounter (Signed)
Her appointment is not with me

## 2014-11-17 NOTE — Telephone Encounter (Signed)
Printed the lab slip

## 2014-11-17 NOTE — Telephone Encounter (Signed)
Patient has a appointment with Dr. Ancil Boozer on 12/03/14 at 8:30 a.m. for a CPE. Please order blood work prior so patient can pick up.

## 2014-11-30 ENCOUNTER — Encounter: Payer: Self-pay | Admitting: Family Medicine

## 2014-11-30 DIAGNOSIS — G4700 Insomnia, unspecified: Secondary | ICD-10-CM | POA: Insufficient documentation

## 2014-11-30 DIAGNOSIS — Z8742 Personal history of other diseases of the female genital tract: Secondary | ICD-10-CM | POA: Insufficient documentation

## 2014-11-30 DIAGNOSIS — Z85828 Personal history of other malignant neoplasm of skin: Secondary | ICD-10-CM | POA: Insufficient documentation

## 2014-11-30 DIAGNOSIS — Z90710 Acquired absence of both cervix and uterus: Secondary | ICD-10-CM | POA: Insufficient documentation

## 2014-12-03 ENCOUNTER — Telehealth: Payer: Self-pay | Admitting: *Deleted

## 2014-12-03 ENCOUNTER — Ambulatory Visit (INDEPENDENT_AMBULATORY_CARE_PROVIDER_SITE_OTHER): Payer: BLUE CROSS/BLUE SHIELD | Admitting: Family Medicine

## 2014-12-03 ENCOUNTER — Encounter: Payer: Self-pay | Admitting: Family Medicine

## 2014-12-03 VITALS — BP 122/84 | HR 102 | Temp 98.2°F | Resp 18 | Ht 60.0 in | Wt 133.3 lb

## 2014-12-03 DIAGNOSIS — E785 Hyperlipidemia, unspecified: Secondary | ICD-10-CM

## 2014-12-03 DIAGNOSIS — R739 Hyperglycemia, unspecified: Secondary | ICD-10-CM | POA: Diagnosis not present

## 2014-12-03 DIAGNOSIS — M810 Age-related osteoporosis without current pathological fracture: Secondary | ICD-10-CM

## 2014-12-03 DIAGNOSIS — G4700 Insomnia, unspecified: Secondary | ICD-10-CM

## 2014-12-03 DIAGNOSIS — R072 Precordial pain: Secondary | ICD-10-CM | POA: Diagnosis not present

## 2014-12-03 DIAGNOSIS — N952 Postmenopausal atrophic vaginitis: Secondary | ICD-10-CM

## 2014-12-03 DIAGNOSIS — Z0000 Encounter for general adult medical examination without abnormal findings: Secondary | ICD-10-CM

## 2014-12-03 DIAGNOSIS — E039 Hypothyroidism, unspecified: Secondary | ICD-10-CM | POA: Diagnosis not present

## 2014-12-03 DIAGNOSIS — Z01419 Encounter for gynecological examination (general) (routine) without abnormal findings: Secondary | ICD-10-CM

## 2014-12-03 MED ORDER — ASPIRIN EC 81 MG PO TBEC: 81.0000 mg | DELAYED_RELEASE_TABLET | Freq: Every day | ORAL | Status: AC

## 2014-12-03 MED ORDER — THYROID 30 MG PO TABS: 30.0000 mg | ORAL_TABLET | Freq: Every day | ORAL | Status: DC

## 2014-12-03 MED ORDER — ALPRAZOLAM 0.25 MG PO TABS: 0.2500 mg | ORAL_TABLET | Freq: Every day | ORAL | Status: DC

## 2014-12-03 MED ORDER — PREMARIN 0.625 MG/GM VA CREA: 1.0000 | TOPICAL_CREAM | VAGINAL | Status: DC

## 2014-12-03 MED ORDER — ATORVASTATIN CALCIUM 40 MG PO TABS: 40.0000 mg | ORAL_TABLET | Freq: Every day | ORAL | Status: DC

## 2014-12-03 NOTE — Telephone Encounter (Signed)
S/w Shelly who states pt just left office and refused to go to ER. States EKG showed t wave changes, faxing over to [place].

## 2014-12-03 NOTE — Telephone Encounter (Signed)
Called Patient and spoke with her and notified her that [place] couldn't see her until August and that we really recommend her going to the ER, pt stated she would like [place] to try another Cardiologist. She said she she would talk to her husband and call me back. I called [facility] and they do have an opening today Dr. Saralyn Pilar at 2:45pm. Pt is being worked in. I called patient and notified her of this. I also stressed that if she does get worse from now until her appt to go to the ER. Pt stated she understood and promised she would.

## 2014-12-03 NOTE — Patient Instructions (Signed)
Osteoporosis Throughout your life, your body breaks down old bone and replaces it with new bone. As you get older, your body does not replace bone as quickly as it breaks it down. By the age of 30 years, most people begin to gradually lose bone because of the imbalance between bone loss and replacement. Some people lose more bone than others. Bone loss beyond a specified normal degree is considered osteoporosis.  Osteoporosis affects the strength and durability of your bones. The inside of the ends of your bones and your flat bones, like the bones of your pelvis, look like honeycomb, filled with tiny open spaces. As bone loss occurs, your bones become less dense. This means that the open spaces inside your bones become bigger and the walls between these spaces become thinner. This makes your bones weaker. Bones of a person with osteoporosis can become so weak that they can break (fracture) during [place] accidents, such as a simple fall. CAUSES  The following factors have been associated with the development of osteoporosis:  Smoking.  Drinking more than 2 alcoholic drinks several days per week.  Long-term use of certain medicines:  Corticosteroids.  Chemotherapy medicines.  Thyroid medicines.  Antiepileptic medicines.  Gonadal hormone suppression medicine.  Immunosuppression medicine.  Being underweight.  Lack of physical activity.  Lack of exposure to the sun. This can lead to vitamin D deficiency.  Certain medical conditions:  Certain inflammatory bowel diseases, such as Crohn disease and ulcerative colitis.  Diabetes.  Hyperthyroidism.  Hyperparathyroidism. RISK FACTORS Anyone can develop osteoporosis. However, the following factors can increase your risk of developing osteoporosis:  Gender--Women are at higher risk than men.  Age--Being older than 50 years increases your risk.  Ethnicity--White and Asian people have an increased risk.  Weight --Being extremely  underweight can increase your risk of osteoporosis.  Family history of osteoporosis--Having a family member who has developed osteoporosis can increase your risk. SYMPTOMS  Usually, people with osteoporosis have no symptoms.  DIAGNOSIS  Signs during a physical exam that may prompt your caregiver to suspect osteoporosis include:  Decreased height. This is usually caused by the compression of the bones that form your spine (vertebrae) because they have weakened and become fractured.  A curving or rounding of the upper back (kyphosis). To confirm signs of osteoporosis, your caregiver may request a procedure that uses 2 low-dose X-ray beams with different levels of energy to measure your bone mineral density (dual-energy X-ray absorptiometry [DXA]). Also, your caregiver may check your level of vitamin D. TREATMENT  The goal of osteoporosis treatment is to strengthen bones in order to decrease the risk of bone fractures. There are different types of medicines available to help achieve this goal. Some of these medicines work by slowing the processes of bone loss. Some medicines work by increasing bone density. Treatment also involves making sure that your levels of calcium and vitamin D are adequate. PREVENTION  There are things you can do to help prevent osteoporosis. Adequate intake of calcium and vitamin D can help you achieve optimal bone mineral density. Regular exercise can also help, especially resistance and weight-bearing activities. If you smoke, quitting smoking is an important part of osteoporosis prevention. MAKE SURE YOU:  Understand these instructions.  Will watch your condition.  Will get help right away if you are not doing well or get worse. FOR MORE INFORMATION www.osteo.org and www.nof.org Document Released: 03/08/2005 Document Revised: 09/23/2012 Document Reviewed: 05/13/2011 [employer] Patient Information 2015 [employer]. This information is not   intended to replace advice  given to you by your health care provider. Make sure you discuss any questions you have with your health care provider.  

## 2014-12-03 NOTE — Progress Notes (Signed)
Name: Denise Mason   MRN: [id number]    DOB: 11/09/1955   Date:12/03/2014       Progress Note  Subjective  Chief Complaint  Chief Complaint  Patient presents with  . Annual Exam    HPI  Well woman exam: s/p hysterectomy for treatment of endometriosis, mammogram is up to date, colonoscopy   Chest pain: woke up at 5 am with substernal chest pressure , with diaphoresis, nausea, no vomiting, no sob, no palpitation.  She has positive family history of heart disease.  She had labs done and LDL is 140. She is under a lot of stress at work, also a close friend with mental illness and also her son's father in law died last week.  She has been very worried.   Hypothyroidism: TSH 4.00, she denies constipation, dry skin or weight gain.   Hyperglycemia: glucose has gone a little high we will add hgbA1C  Osteoporosis: dx in January 2016, she has hypothyroidism. Never treated in the past.  Patient Active Problem List   Diagnosis Date Noted  . H/O total hysterectomy 11/30/2014  . History of basal cell cancer 11/30/2014  . Insomnia, persistent 11/30/2014  . AORTIC STENOSIS, CALCIFIC 11/16/2009  . ABDOMINAL XRAY, ABNORMAL 09/27/2009  . PAIN IN JOINT, MULTIPLE SITES 06/18/2009  . Vitamin D deficiency 03/30/2009  . ABNORMAL CV (STRESS) TEST 02/12/2009  . DEPRESSION 05/13/2008  . Osteoporosis 08/20/2007  . Hypothyroidism, adult 04/30/2007  . ADD 04/30/2007    Past Surgical History  Procedure Laterality Date  . Total abdominal hysterectomy w/ bilateral salpingoophorectomy  1987  . Cholecystectomy  1999  . Appendectomy    . Breast lumpectomy      L breast    History reviewed. No pertinent family history.  History   Social History  . Marital Status: Married    Spouse Name: N/A  . Number of Children: N/A  . Years of Education: N/A   Occupational History  . Not on file.   Social History Main Topics  . Smoking status: Former Smoker -- 1.00 packs/day for 30 years    Types:  Cigarettes    Quit date: 06/12/2012  . Smokeless tobacco: Not on file     Comment: 1 ppd for 30 years   . Alcohol Use: 0.0 oz/week    0 Standard drinks or equivalent per week     Comment: Rarely  . Drug Use: No  . Sexual Activity: Yes   Other Topics Concern  . Not on file   Social History Narrative   Married, 2 grown children, does not get regular exercise.    Cell phone [phone]      Current outpatient prescriptions:  .  ALPRAZolam (XANAX) 0.25 MG tablet, Take 1 tablet (0.25 mg total) by mouth at bedtime., Disp: 30 tablet, Rfl: 2 .  LATISSE 0.03 % ophthalmic solution, Place 1 application into both eyes daily., Disp: , Rfl: 5 .  PREMARIN vaginal cream, Place 1 Applicatorful vaginally 2 (two) times a week., Disp: 42.5 g, Rfl: 12 .  thyroid (ARMOUR THYROID) 30 MG tablet, Take 1 tablet (30 mg total) by mouth daily., Disp: 30 tablet, Rfl: 2 .  aspirin EC 81 MG tablet, Take 1 tablet (81 mg total) by mouth daily., Disp: 30 tablet, Rfl: 5 .  atorvastatin (LIPITOR) 40 MG tablet, Take 1 tablet (40 mg total) by mouth daily., Disp: 30 tablet, Rfl: 5  Allergies  Allergen Reactions  . Aspirin   . Bupropion Hcl  REACTION: depression  . Varenicline Tartrate     REACTION: depression     ROS  Constitutional: Negative for fever or weight change.  Respiratory: Negative for cough and shortness of breath.   Cardiovascular: positive for chest pain, no palpitations.  Gastrointestinal: Negative for abdominal pain, no bowel changes.  Musculoskeletal: Negative for gait problem or joint swelling.  Skin: Negative for rash.  Neurological: Negative for dizziness or headache.  No other specific complaints in a complete review of systems (except as listed in HPI above).  Objective  Filed Vitals:   12/03/14 0908  BP: 122/84  Pulse: 102  Temp: 98.2 F (36.8 C)  TempSrc: Oral  Resp: 18  Height: 5' (1.524 m)  Weight: 133 lb 4.8 oz (60.464 kg)  SpO2: 90%    Body mass index is 26.03  kg/(m^2).  Physical Exam   Constitutional: Patient appears well-developed and well-nourished. No distress.  HENT: Head: Normocephalic and atraumatic. Ears: B TMs ok, no erythema or effusion; Nose: Nose normal. Mouth/Throat: Oropharynx is clear and moist. No oropharyngeal exudate.  Eyes: Conjunctivae and EOM are normal. Pupils are equal, round, and reactive to light. No scleral icterus.  Neck: Normal range of motion. Neck supple. No JVD present. No thyromegaly present.  Cardiovascular: Normal rate, regular rhythm and normal heart sounds.  No murmur heard. No BLE edema. Pulmonary/Chest: Effort normal and breath sounds normal. No respiratory distress. Abdominal: Soft. Bowel sounds are normal, no distension. There is no tenderness. no masses Breast: no lumps or masses, no nipple discharge or rashes FEMALE GENITALIA:  External genitalia normal External urethra normal RECTAL: no rectal masses or hemorrhoids Musculoskeletal: Normal range of motion, no joint effusions. No gross deformities Neurological: he is alert and oriented to person, place, and time. No cranial nerve deficit. Coordination, balance, strength, speech and gait are normal.  Skin: Skin is warm and dry. No rash noted. No erythema.  Psychiatric: Patient has a normal mood and affect. behavior is normal. Judgment and thought content normal.  PHQ2/9: Depression screen PHQ 2/9 12/03/2014  Decreased Interest 1  Down, Depressed, Hopeless 3  PHQ - 2 Score 4  Altered sleeping 3  Tired, decreased energy 3  Change in appetite 3  Feeling bad or failure about yourself  0  Trouble concentrating 1  Moving slowly or fidgety/restless 0  Suicidal thoughts 0  PHQ-9 Score 14  Difficult doing work/chores Somewhat difficult   Situational, usually not depressed, under a lot of stress right now  Fall Risk: Fall Risk  12/03/2014  Falls in the past year? No     Assessment & Plan  1. Well woman exam Discussed importance of 150 minutes of  physical activity weekly, eat two servings of fish weekly, eat one serving of tree nuts ( cashews, pistachios, pecans, almonds.Marland Kitchen) every other day, eat 6 servings of fruit/vegetables daily and drink plenty of water and avoid sweet beverages.   2. Precordial pain Under a lot of stress, referred to cardiologist  but they were too busy to see her now, advised troponin and referral to [facility] now since EKG showed T wave changes, she is not going, she said she will wait to see cardiologist tomorrow. That was done without medical advise . Discussed importance to call 911  if she has recurrence of chest pain    3. Hypothyroidism, adult  - thyroid (ARMOUR THYROID) 30 MG tablet; Take 1 tablet (30 mg total) by mouth daily.  Dispense: 30 tablet; Refill: 2  4. Osteoporosis Discussed options, she  is willing to start Fosamax - PTH, Intact and Calcium  5. Hyperlipidemia Start statin and aspirin because of chest pain, and atherosclerosis on previous imaging  - atorvastatin (LIPITOR) 40 MG tablet; Take 1 tablet (40 mg total) by mouth daily.  Dispense: 30 tablet; Refill: 5  6. Hyperglycemia  - HgB A1c  7. Vaginal atrophy  - PREMARIN vaginal cream; Place 1 Applicatorful vaginally 2 (two) times a week.  Dispense: 42.5 g; Refill: 12  8. Insomnia  - ALPRAZolam (XANAX) 0.25 MG tablet; Take 1 tablet (0.25 mg total) by mouth at bedtime.  Dispense: 30 tablet; Refill: 2

## 2014-12-03 NOTE — Addendum Note (Signed)
Addended by: Steele Sizer F on: 12/03/2014 04:30 PM   Modules accepted: Orders, SmartSet

## 2014-12-03 NOTE — Telephone Encounter (Signed)
Dr Ancil Boozer is asking for [place] to look at patients' chart and see if we can fit her in our schedule today.Or if she needs to go to ED please let them know as soon as possible. Stated to them we do not have any openings but we will look at it.  Pt is in their clinic now.

## 2015-01-20 ENCOUNTER — Ambulatory Visit: Payer: Self-pay | Admitting: Cardiovascular Disease

## 2015-03-05 ENCOUNTER — Ambulatory Visit (INDEPENDENT_AMBULATORY_CARE_PROVIDER_SITE_OTHER): Payer: BLUE CROSS/BLUE SHIELD | Admitting: Family Medicine

## 2015-03-05 ENCOUNTER — Encounter: Payer: Self-pay | Admitting: Family Medicine

## 2015-03-05 VITALS — BP 126/82 | HR 94 | Temp 98.0°F | Resp 16 | Ht 60.0 in | Wt 137.1 lb

## 2015-03-05 DIAGNOSIS — I2510 Atherosclerotic heart disease of native coronary artery without angina pectoris: Secondary | ICD-10-CM | POA: Diagnosis not present

## 2015-03-05 DIAGNOSIS — E039 Hypothyroidism, unspecified: Secondary | ICD-10-CM

## 2015-03-05 DIAGNOSIS — F339 Major depressive disorder, recurrent, unspecified: Secondary | ICD-10-CM | POA: Diagnosis not present

## 2015-03-05 DIAGNOSIS — R072 Precordial pain: Secondary | ICD-10-CM

## 2015-03-05 DIAGNOSIS — Z79899 Other long term (current) drug therapy: Secondary | ICD-10-CM | POA: Diagnosis not present

## 2015-03-05 DIAGNOSIS — Z1159 Encounter for screening for other viral diseases: Secondary | ICD-10-CM | POA: Diagnosis not present

## 2015-03-05 DIAGNOSIS — R079 Chest pain, unspecified: Secondary | ICD-10-CM | POA: Insufficient documentation

## 2015-03-05 DIAGNOSIS — E785 Hyperlipidemia, unspecified: Secondary | ICD-10-CM | POA: Diagnosis not present

## 2015-03-05 DIAGNOSIS — M810 Age-related osteoporosis without current pathological fracture: Secondary | ICD-10-CM | POA: Diagnosis not present

## 2015-03-05 DIAGNOSIS — F411 Generalized anxiety disorder: Secondary | ICD-10-CM

## 2015-03-05 DIAGNOSIS — G4700 Insomnia, unspecified: Secondary | ICD-10-CM | POA: Diagnosis not present

## 2015-03-05 DIAGNOSIS — Z23 Encounter for immunization: Secondary | ICD-10-CM

## 2015-03-05 MED ORDER — DULOXETINE HCL 30 MG PO CPEP: 30.0000 mg | ORAL_CAPSULE | Freq: Every day | ORAL | Status: DC

## 2015-03-05 MED ORDER — NITROGLYCERIN 0.4 MG SL SUBL
0.4000 mg | SUBLINGUAL_TABLET | SUBLINGUAL | Status: DC | PRN
Start: 2015-03-05 — End: 2019-06-27

## 2015-03-05 MED ORDER — METOPROLOL SUCCINATE ER 25 MG PO TB24: 25.0000 mg | ORAL_TABLET | Freq: Every day | ORAL | Status: DC

## 2015-03-05 MED ORDER — ALPRAZOLAM 0.5 MG PO TABS: 0.5000 mg | ORAL_TABLET | Freq: Two times a day (BID) | ORAL | Status: DC | PRN

## 2015-03-05 MED ORDER — ALENDRONATE SODIUM 70 MG PO TABS: 70.0000 mg | ORAL_TABLET | ORAL | Status: DC

## 2015-03-05 MED ORDER — DIVALPROEX SODIUM 250 MG PO DR TAB: 250.0000 mg | DELAYED_RELEASE_TABLET | Freq: Three times a day (TID) | ORAL | Status: DC

## 2015-03-05 NOTE — Progress Notes (Signed)
Name: Denise Mason   MRN: [id number]    DOB: 12/27/1955   Date:03/05/2015       Progress Note  Subjective  Chief Complaint  Chief Complaint  Patient presents with  . Medication Refill    3 month F/U  . Insomnia    Total 5-7 hours nightly, Medication is improving sleep with falling and staying asleep  . Hyperlipidemia  . Hypothyroidism    Well Controlled with medication    HPI  CAD and Precordial chest pain: she had a cath done in 2010 that showed mild LAD lesion. She was not on medication for it. She was seen in June and had an episode of substernal chest pain, heavy sensation, that was triggered by stress. She was seen by Dr. Saralyn Pilar and had a negative echo stress test. However looking back at her chart she did have a LAD lesion and has been placed on Lipitor and today we will add Toprol XL and NTG to take prn. She has had 3 episodes of chest pain since June. All three related to stress.   Insomnia: taking Alprazolam prn at night, but dose is not working as well now  Hyperlipidemia: started on statin in June 2016, denies side effects.  Hypothyroidism: always has dry skin, and has gained a little weight, last THS was 4 we will recheck toda  Osteoporosis: prescription was not sent to pharmacy on her last visit, discussed possible side effects and will send Fosamax today  Major Depression and GAD: she has had multiple episode of depression, has tried and not tolerated or not responded to Prozac, Zoloft and Wellbutrin. She states some of the medication makes her very angry and is afraid to take them. She has been under more stress. Her best friend and neighbor had a nervous breakdown - with some psychotic episode and she wants to be treated for it now. Worried about something like that will happen to her. She is very anxious all the time also   Patient Active Problem List   Diagnosis Date Noted  . Chest pain 03/05/2015  . GAD (generalized anxiety disorder) 03/05/2015  . CAD in  native artery 03/05/2015  . Hyperlipidemia 03/05/2015  . H/O total hysterectomy 11/30/2014  . History of basal cell cancer 11/30/2014  . Insomnia, persistent 11/30/2014  . AORTIC STENOSIS, CALCIFIC 11/16/2009  . PAIN IN JOINT, MULTIPLE SITES 06/18/2009  . Vitamin D deficiency 03/30/2009  . Major depression, recurrent, chronic 05/13/2008  . Osteoporosis 08/20/2007  . Hypothyroidism, adult 04/30/2007  . ADD 04/30/2007    Past Surgical History  Procedure Laterality Date  . Total abdominal hysterectomy w/ bilateral salpingoophorectomy  1987  . Cholecystectomy  1999  . Appendectomy    . Breast lumpectomy      L breast    Family History  Problem Relation Age of Onset  . Cancer Mother 9    Breast Cancer  . Diabetes Father   . Hypertension Father   . Heart attack Maternal Grandmother   . CAD Maternal Grandmother   . CAD Maternal Uncle   . Stroke Paternal Grandmother   . Cancer Sister     Breast Cancer  . Diabetes Sister   . Depression Sister   . Allergic rhinitis Son     Social History   Social History  . Marital Status: Married    Spouse Name: N/A  . Number of Children: N/A  . Years of Education: N/A   Occupational History  . Not on file.  Social History Main Topics  . Smoking status: Former Smoker -- 1.00 packs/day for 30 years    Types: Cigarettes    Start date: 03/05/1983    Quit date: 06/12/2012  . Smokeless tobacco: Never Used     Comment: 1 ppd for 30 years   . Alcohol Use: 0.0 oz/week    0 Standard drinks or equivalent per week     Comment: Rarely  . Drug Use: No  . Sexual Activity:    Partners: Male   Other Topics Concern  . Not on file   Social History Narrative   Married, 2 grown children, does not get regular exercise.    Cell phone [phone]      Current outpatient prescriptions:  .  ALPRAZolam (XANAX) 0.5 MG tablet, Take 1 tablet (0.5 mg total) by mouth 2 (two) times daily as needed for anxiety., Disp: 40 tablet, Rfl: 0 .  aspirin EC  81 MG tablet, Take 1 tablet (81 mg total) by mouth daily., Disp: 30 tablet, Rfl: 5 .  atorvastatin (LIPITOR) 40 MG tablet, Take 1 tablet (40 mg total) by mouth daily., Disp: 30 tablet, Rfl: 5 .  PREMARIN vaginal cream, Place 1 Applicatorful vaginally 2 (two) times a week., Disp: 42.5 g, Rfl: 12 .  thyroid (ARMOUR THYROID) 30 MG tablet, Take 1 tablet (30 mg total) by mouth daily., Disp: 30 tablet, Rfl: 2 .  alendronate (FOSAMAX) 70 MG tablet, Take 1 tablet (70 mg total) by mouth every 7 (seven) days. Take with a full glass of water on an empty stomach., Disp: 4 tablet, Rfl: 11 .  divalproex (DEPAKOTE) 250 MG DR tablet, Take 1 tablet (250 mg total) by mouth 3 (three) times daily., Disp: 30 tablet, Rfl: 1 .  DULoxetine (CYMBALTA) 30 MG capsule, Take 1 capsule (30 mg total) by mouth daily. Increase to two daily after one week, Disp: 60 capsule, Rfl: 0 .  metoprolol succinate (TOPROL-XL) 25 MG 24 hr tablet, Take 1 tablet (25 mg total) by mouth daily., Disp: 30 tablet, Rfl: 5 .  nitroGLYCERIN (NITROSTAT) 0.4 MG SL tablet, Place 1 tablet (0.4 mg total) under the tongue every 5 (five) minutes as needed for chest pain., Disp: 20 tablet, Rfl: 2  Allergies  Allergen Reactions  . Aspirin   . Bupropion Hcl     REACTION: depression  . Varenicline Tartrate     REACTION: depression     ROS  Constitutional: Negative for fever . Positive for weight change.  Respiratory: Negative for cough and shortness of breath.   Cardiovascular: Negative for chest pain or palpitations.  Gastrointestinal: Negative for abdominal pain, no bowel changes.  Musculoskeletal: Negative for gait problem or joint swelling.  Skin: Negative for rash.  Neurological: Negative for dizziness or headache.  No other specific complaints in a complete review of systems (except as listed in HPI above).  Objective  Filed Vitals:   03/05/15 0949  BP: 126/82  Pulse: 94  Temp: 98 F (36.7 C)  TempSrc: Oral  Resp: 16  Height: 5'  (1.524 m)  Weight: 137 lb 1.6 oz (62.188 kg)  SpO2: 99%    Body mass index is 26.78 kg/(m^2).  Physical Exam  Constitutional: Patient appears well-developed and well-nourished.  No distress.  HEENT: head atraumatic, normocephalic, pupils equal and reactive to light,  neck supple, throat within normal limits Cardiovascular: Normal rate, regular rhythm and normal heart sounds.  No murmur heard. No BLE edema. Pulmonary/Chest: Effort normal and breath sounds normal. No respiratory  distress. Abdominal: Soft.  There is no tenderness. Psychiatric: Patient has a normal mood and affect. behavior is normal. Judgment and thought content normal.   PHQ2/9: Depression screen [facility] 2/9 03/05/2015 12/03/2014  Decreased Interest 2 1  Down, Depressed, Hopeless 1 3  PHQ - 2 Score 3 4  Altered sleeping 2 3  Tired, decreased energy 3 3  Change in appetite 3 3  Feeling bad or failure about yourself  2 0  Trouble concentrating 3 1  Moving slowly or fidgety/restless 0 0  Suicidal thoughts 0 0  PHQ-9 Score 16 14  Difficult doing work/chores Somewhat difficult Somewhat difficult     Fall Risk: Fall Risk  03/05/2015 12/03/2014  Falls in the past year? No No      Functional Status Survey: Is the patient deaf or have difficulty hearing?: No Does the patient have difficulty seeing, even when wearing glasses/contacts?: Yes (reading glasses) Does the patient have difficulty concentrating, remembering, or making decisions?: No Does the patient have difficulty walking or climbing stairs?: No Does the patient have difficulty dressing or bathing?: No Does the patient have difficulty doing errands alone such as visiting a doctor's office or shopping?: No    Assessment & Plan  1. Precordial pain  We will start NTG, keep follow up with Dr. Saralyn Pilar  2. CAD in native artery  - Lipid panel - metoprolol succinate (TOPROL-XL) 25 MG 24 hr tablet; Take 1 tablet (25 mg total) by mouth daily.  Dispense: 30  tablet; Refill: 5 - nitroGLYCERIN (NITROSTAT) 0.4 MG SL tablet; Place 1 tablet (0.4 mg total) under the tongue every 5 (five) minutes as needed for chest pain.  Dispense: 20 tablet; Refill: 2  3. Needs flu shot  - Flu Vaccine QUAD 36+ mos PF IM (Fluarix & Fluzone Quad PF)  4. GAD (generalized anxiety disorder)  We will try medication  - ALPRAZolam (XANAX) 0.5 MG tablet; Take 1 tablet (0.5 mg total) by mouth 2 (two) times daily as needed for anxiety.  Dispense: 40 tablet; Refill: 0 - DULoxetine (CYMBALTA) 30 MG capsule; Take 1 capsule (30 mg total) by mouth daily. Increase to two daily after one week  Dispense: 60 capsule; Refill: 0  5. Hypothyroidism, adult  - TSH  6. Major depression, recurrent, chronic  - DULoxetine (CYMBALTA) 30 MG capsule; Take 1 capsule (30 mg total) by mouth daily. Increase to two daily after one week  Dispense: 60 capsule; Refill: 0 - divalproex (DEPAKOTE) 250 MG DR tablet; Take 1 tablet (250 mg total) by mouth 3 (three) times daily.  Dispense: 30 tablet; Refill: 1  7. Osteoporosis  - Calcium, ionized - alendronate (FOSAMAX) 70 MG tablet; Take 1 tablet (70 mg total) by mouth every 7 (seven) days. Take with a full glass of water on an empty stomach.  Dispense: 4 tablet; Refill: 11  8. Insomnia  - ALPRAZolam (XANAX) 0.5 MG tablet; Take 1 tablet (0.5 mg total) by mouth 2 (two) times daily as needed for anxiety.  Dispense: 40 tablet; Refill: 0  9. Hyperlipidemia  - Lipid panel, continue Lipitor  10. Long-term use of high-risk medication  - Comprehensive metabolic panel

## 2015-03-09 LAB — TSH: TSH: 2.84 u[IU]/mL (ref 0.450–4.500)

## 2015-03-09 LAB — CALCIUM, IONIZED: Calcium, Ion: 5.1 mg/dL (ref 4.5–5.6)

## 2015-03-09 LAB — COMPREHENSIVE METABOLIC PANEL
ALBUMIN: 4.3 g/dL (ref 3.5–5.5)
ALT: 17 IU/L (ref 0–32)
AST: 21 IU/L (ref 0–40)
Albumin/Globulin Ratio: 1.6 (ref 1.1–2.5)
Alkaline Phosphatase: 121 IU/L — ABNORMAL HIGH (ref 39–117)
BUN / CREAT RATIO: 11 (ref 9–23)
BUN: 9 mg/dL (ref 6–24)
Bilirubin Total: 0.2 mg/dL (ref 0.0–1.2)
CALCIUM: 9.4 mg/dL (ref 8.7–10.2)
CO2: 23 mmol/L (ref 18–29)
Chloride: 101 mmol/L (ref 97–108)
Creatinine, Ser: 0.79 mg/dL (ref 0.57–1.00)
GFR calc Af Amer: 96 mL/min/{1.73_m2} (ref >59)
GFR, EST NON AFRICAN AMERICAN: 83 mL/min/{1.73_m2} (ref >59)
GLOBULIN, TOTAL: 2.7 g/dL (ref 1.5–4.5)
GLUCOSE: 92 mg/dL (ref 65–99)
Potassium: 4.3 mmol/L (ref 3.5–5.2)
SODIUM: 140 mmol/L (ref 134–144)
Total Protein: 7 g/dL (ref 6.0–8.5)

## 2015-03-09 LAB — LIPID PANEL
CHOL/HDL RATIO: 4.1 ratio (ref 0.0–4.4)
Cholesterol, Total: 196 mg/dL (ref 100–199)
HDL: 48 mg/dL (ref >39)
LDL Calculated: 118 mg/dL — ABNORMAL HIGH (ref 0–99)
TRIGLYCERIDES: 150 mg/dL — AB (ref 0–149)
VLDL CHOLESTEROL CAL: 30 mg/dL (ref 5–40)

## 2015-03-09 LAB — HEPATITIS C ANTIBODY: Hep C Virus Ab: <0.1 s/co ratio (ref 0.0–0.9)

## 2015-03-10 NOTE — Progress Notes (Signed)
Pt.notified

## 2015-03-24 ENCOUNTER — Other Ambulatory Visit: Payer: Self-pay | Admitting: Family Medicine

## 2015-04-06 ENCOUNTER — Encounter (INDEPENDENT_AMBULATORY_CARE_PROVIDER_SITE_OTHER): Payer: Self-pay

## 2015-04-06 ENCOUNTER — Encounter: Payer: Self-pay | Admitting: Family Medicine

## 2015-04-06 ENCOUNTER — Ambulatory Visit (INDEPENDENT_AMBULATORY_CARE_PROVIDER_SITE_OTHER): Payer: BLUE CROSS/BLUE SHIELD | Admitting: Family Medicine

## 2015-04-06 VITALS — BP 108/62 | HR 79 | Temp 98.0°F | Resp 18 | Ht 60.0 in | Wt 135.4 lb

## 2015-04-06 DIAGNOSIS — F339 Major depressive disorder, recurrent, unspecified: Secondary | ICD-10-CM | POA: Diagnosis not present

## 2015-04-06 DIAGNOSIS — G4700 Insomnia, unspecified: Secondary | ICD-10-CM | POA: Diagnosis not present

## 2015-04-06 DIAGNOSIS — F411 Generalized anxiety disorder: Secondary | ICD-10-CM | POA: Diagnosis not present

## 2015-04-06 DIAGNOSIS — E785 Hyperlipidemia, unspecified: Secondary | ICD-10-CM

## 2015-04-06 DIAGNOSIS — I2510 Atherosclerotic heart disease of native coronary artery without angina pectoris: Secondary | ICD-10-CM | POA: Diagnosis not present

## 2015-04-06 DIAGNOSIS — I700 Atherosclerosis of aorta: Secondary | ICD-10-CM | POA: Diagnosis not present

## 2015-04-06 MED ORDER — ALPRAZOLAM 0.5 MG PO TABS: 0.5000 mg | ORAL_TABLET | Freq: Two times a day (BID) | ORAL | Status: DC | PRN

## 2015-04-06 MED ORDER — DULOXETINE HCL 60 MG PO CPEP: 60.0000 mg | ORAL_CAPSULE | Freq: Every day | ORAL | Status: DC

## 2015-04-06 NOTE — Progress Notes (Signed)
Name: Denise Mason   MRN: [id number]    DOB: 1955-09-14   Date:04/06/2015       Progress Note  Subjective  Chief Complaint  Chief Complaint  Patient presents with  . Medication Management    1 month F/U  . Depression    Added Wellbutrin, started on 10th of this month. Made patient nausea the first week but continuing on this week and last week patient is very fatigue. She also describes it as being spacey.   . Hyperlipidemia    Started Liptor last visit, making patient have leg cramps at night.  . Hypertension    Headaches    HPI  CAD and Precordial chest pain: she had a cath done in 2010 that showed mild LAD lesion. She was not on medication for it. She was seen in June and had an episode of substernal chest pain, heavy sensation, that was triggered by stress. She was seen by Dr. Saralyn Pilar and had a negative echo stress test. However looking back at her chart she did have a LAD lesion and has been placed on Lipitor and we also  Added  Toprol XL and NTG to take prn. Since last visit not chest pain.  Feeling well. She has a history of calcified aorta also, but is tolerating Lipitor and aspirin well  Insomnia: taking Alprazolam prn at night, states since started on Cymbalta , she has not started on Depakote yet, and advised her to hold off if she starts to sleep well just on Duloxetine.   Hyperlipidemia: started on statin in June 2016, denies side effects, LDL was 118, but HDL has improved, she wants to hold off on increasing dose of Lipitor at this time.   Hypothyroidism: always has dry skin, lost a few pounds since last visit. Last TSH at goal   Osteoporosis: she has started Fosamax, taking as directed and not side effects  Major Depression and GAD: she has had multiple episode of depression, has tried and not tolerated or not responded to Prozac, Zoloft and Wellbutrin. She states some of the medication makes her very angry and is afraid to take them. She has been under more stress.  Her best friend and neighbor had a nervous breakdown - with some psychotic episode and she wants to be treated for it now. Worried about something like that will happen to her. We started her on Duloxetine on her last visit, she stated the first week she had nausea and headache, however those symptoms resolved, but now that she is on 60 mg she feels thirsty and also feels sleepy during the day.  Discussed switching dose to pm gradually to see if it will improve side effects.  Otherwise we will consider adding stimulant on her next visit. She has not noticed that she is feeling angry, but feels more calm, no longer has chest pain, anxiety has improved significantly. Feeling better overall   Patient Active Problem List   Diagnosis Date Noted  . Chest pain 03/05/2015  . GAD (generalized anxiety disorder) 03/05/2015  . CAD in native artery 03/05/2015  . Hyperlipidemia 03/05/2015  . H/O total hysterectomy 11/30/2014  . History of basal cell cancer 11/30/2014  . Insomnia, persistent 11/30/2014  . Calcification of abdominal aorta (HCC) 11/16/2009  . Vitamin D deficiency 03/30/2009  . Major depression, recurrent, chronic ([place]) 05/13/2008  . Osteoporosis 08/20/2007  . Hypothyroidism, adult 04/30/2007  . ADD 04/30/2007    Past Surgical History  Procedure Laterality Date  . Total abdominal  hysterectomy w/ bilateral salpingoophorectomy  1987  . Cholecystectomy  1999  . Appendectomy    . Breast lumpectomy      L breast    Family History  Problem Relation Age of Onset  . Cancer Mother 73    Breast Cancer  . Diabetes Father   . Hypertension Father   . Heart attack Maternal Grandmother   . CAD Maternal Grandmother   . CAD Maternal Uncle   . Stroke Paternal Grandmother   . Cancer Sister     Breast Cancer  . Diabetes Sister   . Depression Sister   . Allergic rhinitis Son     Social History   Social History  . Marital Status: Married    Spouse Name: N/A  . Number of Children: N/A  .  Years of Education: N/A   Occupational History  . Not on file.   Social History Main Topics  . Smoking status: Former Smoker -- 1.00 packs/day for 30 years    Types: Cigarettes    Start date: 03/05/1983    Quit date: 06/12/2012  . Smokeless tobacco: Never Used     Comment: 1 ppd for 30 years   . Alcohol Use: 0.0 oz/week    0 Standard drinks or equivalent per week     Comment: Rarely  . Drug Use: No  . Sexual Activity:    Partners: Male   Other Topics Concern  . Not on file   Social History Narrative   Married, 2 grown children, does not get regular exercise.    Cell phone [phone]      Current outpatient prescriptions:  .  alendronate (FOSAMAX) 70 MG tablet, Take 1 tablet (70 mg total) by mouth every 7 (seven) days. Take with a full glass of water on an empty stomach., Disp: 4 tablet, Rfl: 11 .  ALPRAZolam (XANAX) 0.5 MG tablet, Take 1 tablet (0.5 mg total) by mouth 2 (two) times daily as needed for anxiety., Disp: 40 tablet, Rfl: 0 .  ARMOUR THYROID 30 MG tablet, TAKE 1 TABLET(30 MG) BY MOUTH DAILY, Disp: 30 tablet, Rfl: 0 .  aspirin EC 81 MG tablet, Take 1 tablet (81 mg total) by mouth daily., Disp: 30 tablet, Rfl: 5 .  atorvastatin (LIPITOR) 40 MG tablet, Take 1 tablet (40 mg total) by mouth daily., Disp: 30 tablet, Rfl: 5 .  divalproex (DEPAKOTE) 250 MG DR tablet, Take 1 tablet (250 mg total) by mouth 3 (three) times daily., Disp: 30 tablet, Rfl: 1 .  metoprolol succinate (TOPROL-XL) 25 MG 24 hr tablet, Take 1 tablet (25 mg total) by mouth daily., Disp: 30 tablet, Rfl: 5 .  nitroGLYCERIN (NITROSTAT) 0.4 MG SL tablet, Place 1 tablet (0.4 mg total) under the tongue every 5 (five) minutes as needed for chest pain., Disp: 20 tablet, Rfl: 2 .  PREMARIN vaginal cream, Place 1 Applicatorful vaginally 2 (two) times a week., Disp: 42.5 g, Rfl: 12 .  DULoxetine (CYMBALTA) 60 MG capsule, Take 1 capsule (60 mg total) by mouth daily., Disp: 30 capsule, Rfl: 0  Allergies  Allergen  Reactions  . Aspirin   . Bupropion Hcl     REACTION: depression  . Varenicline Tartrate     REACTION: depression     ROS  Constitutional: Negative for fever or weight change.  Respiratory: Negative for cough and shortness of breath.   Cardiovascular: Negative for chest pain or palpitations.  Gastrointestinal: Negative for abdominal pain, no bowel changes.  Musculoskeletal: Negative for gait problem  or joint swelling.  Skin: Negative for rash.  Neurological: Negative for dizziness or headache.  No other specific complaints in a complete review of systems (except as listed in HPI above).  Objective  Filed Vitals:   04/06/15 0812  BP: 108/62  Pulse: 79  Temp: 98 F (36.7 C)  TempSrc: Oral  Resp: 18  Height: 5' (1.524 m)  Weight: 135 lb 6.4 oz (61.417 kg)  SpO2: 96%    Body mass index is 26.44 kg/(m^2).  Physical Exam  Constitutional: Patient appears well-developed and well-nourished. Obese  No distress.  HEENT: head atraumatic, normocephalic, pupils equal and reactive to light, neck supple, throat within normal limits Cardiovascular: Normal rate, regular rhythm and normal heart sounds.  No murmur heard. No BLE edema. Pulmonary/Chest: Effort normal and breath sounds normal. No respiratory distress. Abdominal: Soft.  There is no tenderness. Psychiatric: Patient has a normal mood and affect. behavior is normal. Judgment and thought content normal.  Recent Results (from the past 2160 hour(s))  Hepatitis C antibody     Status: None   Collection Time: 03/08/15  8:24 AM  Result Value Ref Range   Hep C Virus Ab <0.1 0.0 - 0.9 s/co ratio    Comment:                                   Negative:     < 0.8                              Indeterminate: 0.8 - 0.9                                   Positive:     > 0.9  The CDC recommends that a positive HCV antibody result  be followed up with a HCV Nucleic Acid Amplification  test ([id number]).   Comprehensive metabolic panel      Status: Abnormal   Collection Time: 03/08/15  8:30 AM  Result Value Ref Range   Glucose 92 65 - 99 mg/dL   BUN 9 6 - 24 mg/dL   Creatinine, Ser 0.79 0.57 - 1.00 mg/dL   GFR calc non Af Amer 83 >59 mL/min/1.73   GFR calc Af Amer 96 >59 mL/min/1.73   BUN/Creatinine Ratio 11 9 - 23   Sodium 140 134 - 144 mmol/L   Potassium 4.3 3.5 - 5.2 mmol/L   Chloride 101 97 - 108 mmol/L   CO2 23 18 - 29 mmol/L   Calcium 9.4 8.7 - 10.2 mg/dL   Total Protein 7.0 6.0 - 8.5 g/dL   Albumin 4.3 3.5 - 5.5 g/dL   Globulin, Total 2.7 1.5 - 4.5 g/dL   Albumin/Globulin Ratio 1.6 1.1 - 2.5   Bilirubin Total 0.2 0.0 - 1.2 mg/dL   Alkaline Phosphatase 121 (H) 39 - 117 IU/L   AST 21 0 - 40 IU/L   ALT 17 0 - 32 IU/L  Lipid panel     Status: Abnormal   Collection Time: 03/08/15  8:30 AM  Result Value Ref Range   Cholesterol, Total 196 100 - 199 mg/dL   Triglycerides 150 (H) 0 - 149 mg/dL   HDL 48 >39 mg/dL    Comment: According to ATP-III Guidelines, HDL-C >59 mg/dL is considered a negative risk factor for CHD.  VLDL Cholesterol Cal 30 5 - 40 mg/dL   LDL Calculated 118 (H) 0 - 99 mg/dL   Chol/HDL Ratio 4.1 0.0 - 4.4 ratio units    Comment:                                   T. Chol/HDL Ratio                                             Men  Women                               1/2 Avg.Risk  3.4    3.3                                   Avg.Risk  5.0    4.4                                2X Avg.Risk  9.6    7.1                                3X Avg.Risk 23.4   11.0   TSH     Status: None   Collection Time: 03/08/15  8:30 AM  Result Value Ref Range   TSH 2.840 0.450 - 4.500 uIU/mL  Calcium, ionized     Status: None   Collection Time: 03/08/15  8:30 AM  Result Value Ref Range   Calcium, Ion 5.1 4.5 - 5.6 mg/dL    PHQ2/9: Depression screen [facility] 2/9 03/05/2015 12/03/2014  Decreased Interest 2 1  Down, Depressed, Hopeless 1 3  PHQ - 2 Score 3 4  Altered sleeping 2 3  Tired, decreased energy 3 3  Change  in appetite 3 3  Feeling bad or failure about yourself  2 0  Trouble concentrating 3 1  Moving slowly or fidgety/restless 0 0  Suicidal thoughts 0 0  PHQ-9 Score 16 14  Difficult doing work/chores Somewhat difficult Somewhat difficult    Fall Risk: Fall Risk  03/05/2015 12/03/2014  Falls in the past year? No No     Assessment & Plan  1. Calcification of abdominal aorta (HCC)  Continue statin, aspirin and betablocker  2. CAD in native artery  Seen by Dr. Lorinda Creed, we added prn NTG and metoprolol on her last visit, advised to take it in the pm  3. Major depression, recurrent, chronic (HCC)  Discussed taking later in the day, or to try about 45 mg daily to see if it works best, we may need to add stimulant for focus on her next visit  - DULoxetine (CYMBALTA) 60 MG capsule; Take 1 capsule (60 mg total) by mouth daily.  Dispense: 30 capsule; Refill: 0  4. Insomnia  - ALPRAZolam (XANAX) 0.5 MG tablet; Take 1 tablet (0.5 mg total) by mouth 2 (two) times daily as needed for anxiety.  Dispense: 40 tablet; Refill: 0  5. GAD (generalized anxiety disorder)  - ALPRAZolam (XANAX) 0.5 MG tablet; Take 1 tablet (0.5 mg total) by mouth 2 (two)  times daily as needed for anxiety.  Dispense: 40 tablet; Refill: 0

## 2015-05-03 ENCOUNTER — Other Ambulatory Visit: Payer: Self-pay | Admitting: Family Medicine

## 2015-05-19 ENCOUNTER — Encounter: Payer: Self-pay | Admitting: Family Medicine

## 2015-05-19 ENCOUNTER — Ambulatory Visit (INDEPENDENT_AMBULATORY_CARE_PROVIDER_SITE_OTHER): Payer: BLUE CROSS/BLUE SHIELD | Admitting: Family Medicine

## 2015-05-19 VITALS — BP 118/64 | HR 72 | Temp 97.9°F | Resp 16 | Ht 60.0 in | Wt 133.3 lb

## 2015-05-19 DIAGNOSIS — E785 Hyperlipidemia, unspecified: Secondary | ICD-10-CM | POA: Diagnosis not present

## 2015-05-19 DIAGNOSIS — I2510 Atherosclerotic heart disease of native coronary artery without angina pectoris: Secondary | ICD-10-CM

## 2015-05-19 DIAGNOSIS — F411 Generalized anxiety disorder: Secondary | ICD-10-CM | POA: Diagnosis not present

## 2015-05-19 DIAGNOSIS — G4700 Insomnia, unspecified: Secondary | ICD-10-CM

## 2015-05-19 DIAGNOSIS — F339 Major depressive disorder, recurrent, unspecified: Secondary | ICD-10-CM | POA: Diagnosis not present

## 2015-05-19 MED ORDER — THYROID 30 MG PO TABS: ORAL_TABLET | ORAL | Status: DC

## 2015-05-19 MED ORDER — DULOXETINE HCL 60 MG PO CPEP: ORAL_CAPSULE | ORAL | Status: DC

## 2015-05-19 MED ORDER — ALPRAZOLAM 0.5 MG PO TABS: 0.5000 mg | ORAL_TABLET | Freq: Two times a day (BID) | ORAL | Status: DC | PRN

## 2015-05-19 MED ORDER — ATORVASTATIN CALCIUM 40 MG PO TABS: 40.0000 mg | ORAL_TABLET | Freq: Every day | ORAL | Status: DC

## 2015-05-19 NOTE — Progress Notes (Signed)
Name: Denise Mason   MRN: [id number]    DOB: 1955-09-19   Date:05/19/2015       Progress Note  Subjective  Chief Complaint  Chief Complaint  Patient presents with  . Medication Refill    follow-up 1 month  . Depression    cymbalta working better since taking it earlier in day    HPI  CAD and Precordial chest pain: she had a cath done in 2010 that showed mild LAD lesion. She was not on medication for it. She was seen in June and had an episode of substernal chest pain, heavy sensation, that was triggered by stress. She was seen by Dr. Saralyn Pilar and had a negative echo stress test. LAD lesion, she is on Toprol XL, aspirin and Atorvastatin, no recent episodes of chest pain, feeling well since stress level has gone down.   Insomnia: taking Alprazolam at night, states since started on Cymbalta , she has been feeling rested when she wakes up  Hyperlipidemia: started on statin in June 2016, denies side effects, LDL was 118, but HDL has improved, she wants to hold off on increasing dose of Lipitor at this time.   Hypothyroidism: always has dry skin, lost a couple of  pounds since last visit. Last TSH at goal. She states that since she is feeling emotionally better she has been more active, walking during her lunch break.   Major Depression and GAD: she has had multiple episode of depression, has tried and not tolerated or not responded to Prozac, Zoloft and Wellbutrin. She states some of the medication makes her very angry and is afraid to take them. She has been under more stress. Her best friend and neighbor had a nervous breakdown. We started her on Duloxetine back in September, she stated the first week she had nausea and headache, however those symptoms resolved, but now that she is on 60 mg she states occasionally gets tired during the day, but usually at the end of the day, and she does not want to change the time that she takes medication.. She has not noticed that she is feeling angry, but  feels more calm, no longer has chest pain, anxiety has improved significantly, GAD today ws 3. Feeling better overall   Patient Active Problem List   Diagnosis Date Noted  . Chest pain 03/05/2015  . GAD (generalized anxiety disorder) 03/05/2015  . CAD in native artery 03/05/2015  . Hyperlipidemia 03/05/2015  . H/O total hysterectomy 11/30/2014  . History of basal cell cancer 11/30/2014  . Insomnia, persistent 11/30/2014  . Calcification of abdominal aorta (HCC) 11/16/2009  . Vitamin D deficiency 03/30/2009  . Major depression, recurrent, chronic ([place]) 05/13/2008  . Osteoporosis 08/20/2007  . Hypothyroidism, adult 04/30/2007  . ADD 04/30/2007    Past Surgical History  Procedure Laterality Date  . Total abdominal hysterectomy w/ bilateral salpingoophorectomy  1987  . Cholecystectomy  1999  . Appendectomy    . Breast lumpectomy      L breast    Family History  Problem Relation Age of Onset  . Cancer Mother 4    Breast Cancer  . Diabetes Father   . Hypertension Father   . Heart attack Maternal Grandmother   . CAD Maternal Grandmother   . CAD Maternal Uncle   . Stroke Paternal Grandmother   . Cancer Sister     Breast Cancer  . Diabetes Sister   . Depression Sister   . Allergic rhinitis Son     Social  History   Social History  . Marital Status: Married    Spouse Name: N/A  . Number of Children: N/A  . Years of Education: N/A   Occupational History  . Not on file.   Social History Main Topics  . Smoking status: Former Smoker -- 1.00 packs/day for 30 years    Types: Cigarettes    Start date: 03/05/1983    Quit date: 06/12/2012  . Smokeless tobacco: Never Used     Comment: 1 ppd for 30 years   . Alcohol Use: 0.0 oz/week    0 Standard drinks or equivalent per week     Comment: Rarely  . Drug Use: No  . Sexual Activity:    Partners: Male   Other Topics Concern  . Not on file   Social History Narrative   Married, 2 grown children, does not get regular  exercise.    Cell phone [phone]      Current outpatient prescriptions:  .  alendronate (FOSAMAX) 70 MG tablet, Take 1 tablet (70 mg total) by mouth every 7 (seven) days. Take with a full glass of water on an empty stomach., Disp: 4 tablet, Rfl: 11 .  ALPRAZolam (XANAX) 0.5 MG tablet, Take 1 tablet (0.5 mg total) by mouth 2 (two) times daily as needed. for anxiety, Disp: 40 tablet, Rfl: 2 .  aspirin EC 81 MG tablet, Take 1 tablet (81 mg total) by mouth daily., Disp: 30 tablet, Rfl: 5 .  atorvastatin (LIPITOR) 40 MG tablet, Take 1 tablet (40 mg total) by mouth daily., Disp: 30 tablet, Rfl: 5 .  DULoxetine (CYMBALTA) 60 MG capsule, TAKE 1 CAPSULE(60 MG) BY MOUTH DAILY, Disp: 30 capsule, Rfl: 2 .  metoprolol succinate (TOPROL-XL) 25 MG 24 hr tablet, Take 1 tablet (25 mg total) by mouth daily., Disp: 30 tablet, Rfl: 5 .  nitroGLYCERIN (NITROSTAT) 0.4 MG SL tablet, Place 1 tablet (0.4 mg total) under the tongue every 5 (five) minutes as needed for chest pain., Disp: 20 tablet, Rfl: 2 .  PREMARIN vaginal cream, Place 1 Applicatorful vaginally 2 (two) times a week., Disp: 42.5 g, Rfl: 12 .  thyroid (ARMOUR THYROID) 30 MG tablet, TAKE 1 TABLET(30 MG) BY MOUTH DAILY, Disp: 30 tablet, Rfl: 5  Allergies  Allergen Reactions  . Aspirin   . Bupropion Hcl     REACTION: depression  . Varenicline Tartrate     REACTION: depression     ROS  Constitutional: Negative for fever or weight change.  Respiratory: Negative for cough and shortness of breath.   Cardiovascular: Negative for chest pain or palpitations.  Gastrointestinal: Negative for abdominal pain, no bowel changes.  Musculoskeletal: Negative for gait problem or joint swelling.  Skin: Negative for rash.  Neurological: Negative for dizziness or headache.  No other specific complaints in a complete review of systems (except as listed in HPI above).  Objective  Filed Vitals:   05/19/15 0950  BP: 118/64  Pulse: 72  Temp: 97.9 F (36.6 C)   TempSrc: Oral  Resp: 16  Height: 5' (1.524 m)  Weight: 133 lb 4.8 oz (60.464 kg)  SpO2: 99%    Body mass index is 26.03 kg/(m^2).  Physical Exam  Constitutional: Patient appears well-developed and well-nourished. Obese  No distress.  HEENT: head atraumatic, normocephalic, pupils equal and reactive to light, neck supple, throat within normal limits Cardiovascular: Normal rate, regular rhythm and normal heart sounds.  No murmur heard. No BLE edema. Pulmonary/Chest: Effort normal and breath sounds normal. No  respiratory distress. Abdominal: Soft.  There is no tenderness. Psychiatric: Patient has a normal mood and affect. behavior is normal. Judgment and thought content normal.  Recent Results (from the past 2160 hour(s))  Hepatitis C antibody     Status: None   Collection Time: 03/08/15  8:24 AM  Result Value Ref Range   Hep C Virus Ab <0.1 0.0 - 0.9 s/co ratio    Comment:                                   Negative:     < 0.8                              Indeterminate: 0.8 - 0.9                                   Positive:     > 0.9  The CDC recommends that a positive HCV antibody result  be followed up with a HCV Nucleic Acid Amplification  test [id number]).   Comprehensive metabolic panel     Status: Abnormal   Collection Time: 03/08/15  8:30 AM  Result Value Ref Range   Glucose 92 65 - 99 mg/dL   BUN 9 6 - 24 mg/dL   Creatinine, Ser 0.79 0.57 - 1.00 mg/dL   GFR calc non Af Amer 83 >59 mL/min/1.73   GFR calc Af Amer 96 >59 mL/min/1.73   BUN/Creatinine Ratio 11 9 - 23   Sodium 140 134 - 144 mmol/L   Potassium 4.3 3.5 - 5.2 mmol/L   Chloride 101 97 - 108 mmol/L   CO2 23 18 - 29 mmol/L   Calcium 9.4 8.7 - 10.2 mg/dL   Total Protein 7.0 6.0 - 8.5 g/dL   Albumin 4.3 3.5 - 5.5 g/dL   Globulin, Total 2.7 1.5 - 4.5 g/dL   Albumin/Globulin Ratio 1.6 1.1 - 2.5   Bilirubin Total 0.2 0.0 - 1.2 mg/dL   Alkaline Phosphatase 121 (H) 39 - 117 IU/L   AST 21 0 - 40 IU/L   ALT 17 0 - 32  IU/L  Lipid panel     Status: Abnormal   Collection Time: 03/08/15  8:30 AM  Result Value Ref Range   Cholesterol, Total 196 100 - 199 mg/dL   Triglycerides 150 (H) 0 - 149 mg/dL   HDL 48 >39 mg/dL    Comment: According to ATP-III Guidelines, HDL-C >59 mg/dL is considered a negative risk factor for CHD.    VLDL Cholesterol Cal 30 5 - 40 mg/dL   LDL Calculated 118 (H) 0 - 99 mg/dL   Chol/HDL Ratio 4.1 0.0 - 4.4 ratio units    Comment:                                   T. Chol/HDL Ratio                                             Men  Women  1/2 Avg.Risk  3.4    3.3                                   Avg.Risk  5.0    4.4                                2X Avg.Risk  9.6    7.1                                3X Avg.Risk 23.4   11.0   TSH     Status: None   Collection Time: 03/08/15  8:30 AM  Result Value Ref Range   TSH 2.840 0.450 - 4.500 uIU/mL  Calcium, ionized     Status: None   Collection Time: 03/08/15  8:30 AM  Result Value Ref Range   Calcium, Ion 5.1 4.5 - 5.6 mg/dL    PHQ2/9: Depression screen [facility] 2/9 05/19/2015 03/05/2015 12/03/2014  Decreased Interest 0 2 1  Down, Depressed, Hopeless 1 1 3   PHQ - 2 Score 1 3 4   Altered sleeping - 2 3  Tired, decreased energy - 3 3  Change in appetite - 3 3  Feeling bad or failure about yourself  - 2 0  Trouble concentrating - 3 1  Moving slowly or fidgety/restless - 0 0  Suicidal thoughts - 0 0  PHQ-9 Score - 16 14  Difficult doing work/chores - Somewhat difficult Somewhat difficult    Fall Risk: Fall Risk  05/19/2015 03/05/2015 12/03/2014  Falls in the past year? No No No     Functional Status Survey: Is the patient deaf or have difficulty hearing?: No Does the patient have difficulty seeing, even when wearing glasses/contacts?: Yes Does the patient have difficulty concentrating, remembering, or making decisions?: No Does the patient have difficulty walking or climbing stairs?: No Does the patient  have difficulty dressing or bathing?: No Does the patient have difficulty doing errands alone such as visiting a doctor's office or shopping?: No    Assessment & Plan  1. CAD in native artery  - atorvastatin (LIPITOR) 40 MG tablet; Take 1 tablet (40 mg total) by mouth daily.  Dispense: 30 tablet; Refill: 5  2. Insomnia  - ALPRAZolam (XANAX) 0.5 MG tablet; Take 1 tablet (0.5 mg total) by mouth 2 (two) times daily as needed. for anxiety  Dispense: 40 tablet; Refill: 2  3. Major depression, recurrent, chronic (HCC)  - ALPRAZolam (XANAX) 0.5 MG tablet; Take 1 tablet (0.5 mg total) by mouth 2 (two) times daily as needed. for anxiety  Dispense: 40 tablet; Refill: 2 - DULoxetine (CYMBALTA) 60 MG capsule; TAKE 1 CAPSULE(60 MG) BY MOUTH DAILY  Dispense: 30 capsule; Refill: 2  4. Hyperlipidemia  - atorvastatin (LIPITOR) 40 MG tablet; Take 1 tablet (40 mg total) by mouth daily.  Dispense: 30 tablet; Refill: 5  5. GAD (generalized anxiety disorder)  - ALPRAZolam (XANAX) 0.5 MG tablet; Take 1 tablet (0.5 mg total) by mouth 2 (two) times daily as needed. for anxiety  Dispense: 40 tablet; Refill: 2 - DULoxetine (CYMBALTA) 60 MG capsule; TAKE 1 CAPSULE(60 MG) BY MOUTH DAILY  Dispense: 30 capsule; Refill: 2

## 2015-06-02 ENCOUNTER — Other Ambulatory Visit: Payer: Self-pay | Admitting: Family Medicine

## 2015-06-02 DIAGNOSIS — Z1231 Encounter for screening mammogram for malignant neoplasm of breast: Secondary | ICD-10-CM

## 2015-06-03 ENCOUNTER — Other Ambulatory Visit: Payer: Self-pay | Admitting: Family Medicine

## 2015-06-03 ENCOUNTER — Ambulatory Visit
Admission: RE | Admit: 2015-06-03 | Discharge: 2015-06-03 | Disposition: A | Discharge status: Home / Self Care | Payer: BLUE CROSS/BLUE SHIELD | Source: Ambulatory Visit | Attending: Family Medicine | Admitting: Family Medicine

## 2015-06-03 DIAGNOSIS — Z1231 Encounter for screening mammogram for malignant neoplasm of breast: Secondary | ICD-10-CM | POA: Diagnosis not present

## 2015-06-03 HISTORY — DX: Malignant (primary) neoplasm, unspecified: C80.1

## 2015-06-08 ENCOUNTER — Other Ambulatory Visit: Payer: Self-pay | Admitting: Family Medicine

## 2015-06-08 ENCOUNTER — Other Ambulatory Visit: Payer: Self-pay

## 2015-06-08 ENCOUNTER — Encounter: Payer: Self-pay | Admitting: Family Medicine

## 2015-06-08 DIAGNOSIS — N6320 Unspecified lump in the left breast, unspecified quadrant: Secondary | ICD-10-CM

## 2015-06-08 DIAGNOSIS — E785 Hyperlipidemia, unspecified: Secondary | ICD-10-CM

## 2015-06-08 DIAGNOSIS — R928 Other abnormal and inconclusive findings on diagnostic imaging of breast: Secondary | ICD-10-CM

## 2015-06-16 ENCOUNTER — Ambulatory Visit
Admission: RE | Admit: 2015-06-16 | Discharge: 2015-06-16 | Disposition: A | Discharge status: Home / Self Care | Payer: BLUE CROSS/BLUE SHIELD | Source: Ambulatory Visit | Attending: Family Medicine | Admitting: Family Medicine

## 2015-06-16 ENCOUNTER — Encounter: Payer: Self-pay | Admitting: Family Medicine

## 2015-06-16 ENCOUNTER — Other Ambulatory Visit: Payer: Self-pay | Admitting: Family Medicine

## 2015-06-16 DIAGNOSIS — N6320 Unspecified lump in the left breast, unspecified quadrant: Secondary | ICD-10-CM

## 2015-06-16 DIAGNOSIS — N63 Unspecified lump in breast: Secondary | ICD-10-CM | POA: Insufficient documentation

## 2015-06-16 DIAGNOSIS — R928 Other abnormal and inconclusive findings on diagnostic imaging of breast: Secondary | ICD-10-CM

## 2015-06-21 ENCOUNTER — Ambulatory Visit: Payer: Self-pay | Admitting: General Surgery

## 2015-07-01 ENCOUNTER — Encounter: Payer: Self-pay | Admitting: General Surgery

## 2015-07-05 ENCOUNTER — Ambulatory Visit (INDEPENDENT_AMBULATORY_CARE_PROVIDER_SITE_OTHER): Payer: BLUE CROSS/BLUE SHIELD | Admitting: General Surgery

## 2015-07-05 ENCOUNTER — Encounter: Payer: Self-pay | Admitting: General Surgery

## 2015-07-05 VITALS — BP 124/74 | HR 76 | Resp 12 | Ht 60.0 in | Wt 135.0 lb

## 2015-07-05 DIAGNOSIS — N6002 Solitary cyst of left breast: Secondary | ICD-10-CM | POA: Diagnosis not present

## 2015-07-05 NOTE — Patient Instructions (Signed)
Patient to return as needed. 

## 2015-07-05 NOTE — Progress Notes (Signed)
Patient ID: Denise Mason, female   DOB: 1956-03-22, 60 y.o.   MRN: [id number]  Chief Complaint  Patient presents with  . Other    left breast mass    HPI Denise Mason is a 60 y.o. female who presents for a breast evaluation. The most recent mammogram was done on 06/03/15 and added views and left breast ultrasound done on 06/16/15.Marland Kitchen  Patient does perform regular self breast checks and gets regular mammograms done.    I personally reviewed the patient's history.  HPI  Past Medical History  Diagnosis Date  . Hypothyroidism   . ADD (attention deficit disorder with hyperactivity)   . Osteopenia   . Depression   . HLD (hyperlipidemia)   . CAD (coronary artery disease)     mild, non-obstructive. by cath 02/16/09 (luminal irreg mid LAD and mid RCA)   . Cancer ([place])     basil    Past Surgical History  Procedure Laterality Date  . Total abdominal hysterectomy w/ bilateral salpingoophorectomy  1987  . Cholecystectomy  1999  . Appendectomy    . Breast lumpectomy      L breast  . Breast biopsy Bilateral     FNA    Family History  Problem Relation Age of Onset  . Cancer Mother 51    Breast Cancer  . Breast cancer Mother 105  . Diabetes Father   . Hypertension Father   . Heart attack Maternal Grandmother   . CAD Maternal Grandmother   . CAD Maternal Uncle   . Stroke Paternal Grandmother   . Cancer Sister     Breast Cancer  . Diabetes Sister   . Depression Sister   . Breast cancer Sister 66  . Allergic rhinitis Son     Social History Social History  Substance Use Topics  . Smoking status: Former Smoker -- 1.00 packs/day for 30 years    Types: Cigarettes    Start date: 03/05/1983    Quit date: 06/12/2012  . Smokeless tobacco: Never Used     Comment: 1 ppd for 30 years   . Alcohol Use: 0.0 oz/week    0 Standard drinks or equivalent per week     Comment: Rarely    Allergies  Allergen Reactions  . Aspirin   . Bupropion Hcl     REACTION: depression  . Varenicline  Tartrate     REACTION: depression    Current Outpatient Prescriptions  Medication Sig Dispense Refill  . ALPRAZolam (XANAX) 0.5 MG tablet Take 1 tablet (0.5 mg total) by mouth 2 (two) times daily as needed. for anxiety 40 tablet 2  . aspirin EC 81 MG tablet Take 1 tablet (81 mg total) by mouth daily. 30 tablet 5  . atorvastatin (LIPITOR) 40 MG tablet Take 1 tablet (40 mg total) by mouth daily. 30 tablet 5  . DULoxetine (CYMBALTA) 60 MG capsule TAKE 1 CAPSULE(60 MG) BY MOUTH DAILY 30 capsule 2  . metoprolol succinate (TOPROL-XL) 25 MG 24 hr tablet Take 1 tablet (25 mg total) by mouth daily. 30 tablet 5  . nitroGLYCERIN (NITROSTAT) 0.4 MG SL tablet Place 1 tablet (0.4 mg total) under the tongue every 5 (five) minutes as needed for chest pain. 20 tablet 2  . PREMARIN vaginal cream Place 1 Applicatorful vaginally 2 (two) times a week. 42.5 g 12  . thyroid (ARMOUR THYROID) 30 MG tablet TAKE 1 TABLET(30 MG) BY MOUTH DAILY 30 tablet 5   No current facility-administered medications for this visit.  Review of Systems Review of Systems  Constitutional: Negative.   Respiratory: Negative.   Cardiovascular: Negative.     Blood pressure 124/74, pulse 76, resp. rate 12, height 5' (1.524 m), weight 135 lb (61.236 kg).  Physical Exam Physical Exam  Constitutional: She is oriented to person, place, and time. She appears well-developed and well-nourished.  Eyes: Conjunctivae are normal. No scleral icterus.  Neck: Neck supple.  Cardiovascular: Normal rate, regular rhythm and normal heart sounds.   Pulmonary/Chest: Effort normal and breath sounds normal. Right breast exhibits no inverted nipple, no mass, no nipple discharge, no skin change and no tenderness. Left breast exhibits no inverted nipple, no mass, no nipple discharge, no skin change and no tenderness.  Lymphadenopathy:    She has no cervical adenopathy.    She has no axillary adenopathy.  Neurological: She is alert and oriented to person,  place, and time.  Skin: Skin is warm and dry.    Data Reviewed Screening mammograms of 06/03/2015, diagnostic mammogram and left breast dated 06/16/2015 and ultrasound of the breast of the same date were independently reviewed. Simple cyst of the breast. BI-RADS-2.  Assessment    Asymptomatic left breast cyst.    Plan    No indication for intervention at this time. The area is nonpalpable, nontender and presents no risks to the patient.   Patient to return as needed.  PCP:  Steele Sizer This information has been scribed by Gaspar Cola CMA.    Robert Bellow 07/06/2015, 5:58 PM

## 2015-07-06 DIAGNOSIS — N6009 Solitary cyst of unspecified breast: Secondary | ICD-10-CM | POA: Insufficient documentation

## 2015-08-10 ENCOUNTER — Other Ambulatory Visit: Payer: Self-pay | Admitting: Family Medicine

## 2015-08-11 NOTE — Telephone Encounter (Signed)
Patient requesting refill. 

## 2015-08-17 ENCOUNTER — Encounter: Payer: Self-pay | Admitting: Family Medicine

## 2015-08-17 ENCOUNTER — Ambulatory Visit (INDEPENDENT_AMBULATORY_CARE_PROVIDER_SITE_OTHER): Payer: BLUE CROSS/BLUE SHIELD | Admitting: Family Medicine

## 2015-08-17 ENCOUNTER — Other Ambulatory Visit: Payer: Self-pay | Admitting: Family Medicine

## 2015-08-17 VITALS — BP 124/70 | HR 93 | Temp 98.6°F | Resp 16 | Wt 137.2 lb

## 2015-08-17 DIAGNOSIS — F339 Major depressive disorder, recurrent, unspecified: Secondary | ICD-10-CM | POA: Diagnosis not present

## 2015-08-17 DIAGNOSIS — E039 Hypothyroidism, unspecified: Secondary | ICD-10-CM

## 2015-08-17 DIAGNOSIS — I2510 Atherosclerotic heart disease of native coronary artery without angina pectoris: Secondary | ICD-10-CM

## 2015-08-17 DIAGNOSIS — B001 Herpesviral vesicular dermatitis: Secondary | ICD-10-CM | POA: Insufficient documentation

## 2015-08-17 DIAGNOSIS — E785 Hyperlipidemia, unspecified: Secondary | ICD-10-CM

## 2015-08-17 DIAGNOSIS — F411 Generalized anxiety disorder: Secondary | ICD-10-CM

## 2015-08-17 DIAGNOSIS — G4700 Insomnia, unspecified: Secondary | ICD-10-CM

## 2015-08-17 MED ORDER — ALPRAZOLAM 0.5 MG PO TABS: 0.5000 mg | ORAL_TABLET | Freq: Two times a day (BID) | ORAL | Status: DC | PRN

## 2015-08-17 MED ORDER — DULOXETINE HCL 60 MG PO CPEP: ORAL_CAPSULE | ORAL | Status: DC

## 2015-08-17 MED ORDER — METOPROLOL SUCCINATE ER 25 MG PO TB24: 25.0000 mg | ORAL_TABLET | Freq: Every day | ORAL | Status: DC

## 2015-08-17 NOTE — Progress Notes (Signed)
Name: Denise Mason   MRN: [id number]    DOB: April 09, 1956   Date:08/17/2015       Progress Note  Subjective  Chief Complaint  Chief Complaint  Patient presents with  . Coronary Artery Disease    patient is here for her 69-month f/u  . Insomnia  . Depression    none  . Hyperlipidemia  . Anxiety    patient's mood has improved since starting the new medication and feels better overall.  . Medication Refill    HPI  CAD and Precordial chest pain: she had a cath done in 2010 that showed mild LAD lesion. S. She was seen in June and had an episode of substernal chest pain, heavy sensation, that was triggered by stress. She was seen by Dr. Saralyn Pilar and had a negative echo stress test. LAD lesion, she is on Toprol XL, aspirin and Atorvastatin, no recent episodes of chest pain, feeling well since stress level has gone down.   Insomnia: taking Alprazolam at night, states since started on Cymbalta , she has been feeling rested when she wakes up. Able to fall and stay asleep.  Hyperlipidemia: started on statin in June 2016, denies side effects, LDL was 118, but HDL has improved, she wants to hold off on increasing dose of Lipitor at this time.   Hypothyroidism: always has dry skin, weight has been stable. Last TSH at goal. She is not longer walking because her walking buddy got hurt, but she will try walking after work.   Major Depression and GAD: she has had multiple episode of depression, has tried and not tolerated or not responded to Prozac, Zoloft and Wellbutrin. She states some of the medication makes her very angry and is afraid to take them.  Her best friend and neighbor had a nervous breakdown. We started her on Duloxetine back in September, she stated the first week she had nausea and headache, however those symptoms resolved, but now that she is on 60 mg she states, she gets tired around 4 pm, but able to stay up. She has not noticed that she is feeling angry, but feels more calm, no longer  has chest pain, anxiety has improved significantly, GAD today last 3. Feeling better overall. " things don't bother me as much, I wish I had started this medication a long time ago"  Patient Active Problem List   Diagnosis Date Noted  . Fever blister 08/17/2015  . Breast cyst 07/06/2015  . GAD (generalized anxiety disorder) 03/05/2015  . CAD in native artery 03/05/2015  . Hyperlipidemia 03/05/2015  . H/O total hysterectomy 11/30/2014  . History of basal cell cancer 11/30/2014  . Insomnia, persistent 11/30/2014  . Calcification of abdominal aorta (HCC) 11/16/2009  . Vitamin D deficiency 03/30/2009  . Major depression, recurrent, chronic ([place]) 05/13/2008  . Osteoporosis 08/20/2007  . Hypothyroidism, adult 04/30/2007  . ADD 04/30/2007    Past Surgical History  Procedure Laterality Date  . Total abdominal hysterectomy w/ bilateral salpingoophorectomy  1987  . Cholecystectomy  1999  . Appendectomy    . Breast lumpectomy      L breast  . Breast biopsy Bilateral     FNA    Family History  Problem Relation Age of Onset  . Cancer Mother 1    Breast Cancer  . Breast cancer Mother 36  . Diabetes Father   . Hypertension Father   . Heart attack Maternal Grandmother   . CAD Maternal Grandmother   . CAD Maternal Uncle   .  Stroke Paternal Grandmother   . Cancer Sister     Breast Cancer  . Diabetes Sister   . Depression Sister   . Breast cancer Sister 58  . Allergic rhinitis Son     Social History   Social History  . Marital Status: Married    Spouse Name: N/A  . Number of Children: N/A  . Years of Education: N/A   Occupational History  . Not on file.   Social History Main Topics  . Smoking status: Former Smoker -- 1.00 packs/day for 30 years    Types: Cigarettes    Start date: 03/05/1983    Quit date: 06/12/2012  . Smokeless tobacco: Never Used     Comment: 1 ppd for 30 years   . Alcohol Use: 0.0 oz/week    0 Standard drinks or equivalent per week     Comment:  Rarely  . Drug Use: No  . Sexual Activity:    Partners: Male   Other Topics Concern  . Not on file   Social History Narrative   Married, 2 grown children, does not get regular exercise.    Cell phone [phone]      Current outpatient prescriptions:  .  ALPRAZolam (XANAX) 0.5 MG tablet, Take 1 tablet (0.5 mg total) by mouth 2 (two) times daily as needed. for anxiety, Disp: 40 tablet, Rfl: 1 .  aspirin EC 81 MG tablet, Take 1 tablet (81 mg total) by mouth daily., Disp: 30 tablet, Rfl: 5 .  atorvastatin (LIPITOR) 40 MG tablet, Take 1 tablet (40 mg total) by mouth daily., Disp: 30 tablet, Rfl: 5 .  DULoxetine (CYMBALTA) 60 MG capsule, TAKE 1 CAPSULE(60 MG) BY MOUTH DAILY, Disp: 30 capsule, Rfl: 5 .  metoprolol succinate (TOPROL-XL) 25 MG 24 hr tablet, Take 1 tablet (25 mg total) by mouth daily., Disp: 30 tablet, Rfl: 5 .  nitroGLYCERIN (NITROSTAT) 0.4 MG SL tablet, Place 1 tablet (0.4 mg total) under the tongue every 5 (five) minutes as needed for chest pain., Disp: 20 tablet, Rfl: 2 .  PREMARIN vaginal cream, Place 1 Applicatorful vaginally 2 (two) times a week., Disp: 42.5 g, Rfl: 12 .  thyroid (ARMOUR THYROID) 30 MG tablet, TAKE 1 TABLET(30 MG) BY MOUTH DAILY, Disp: 30 tablet, Rfl: 5 .  valACYclovir (VALTREX) 1000 MG tablet, , Disp: , Rfl: 3  Allergies  Allergen Reactions  . Aspirin   . Bupropion Hcl     REACTION: depression  . Varenicline Tartrate     REACTION: depression     ROS  Constitutional: Negative for fever or weight change.  Respiratory: Negative for cough and shortness of breath.   Cardiovascular: Negative for chest pain or palpitations.  Gastrointestinal: Negative for abdominal pain, no bowel changes.  Musculoskeletal: Negative for gait problem or joint swelling.  Skin: Negative for rash.  Neurological: Negative for dizziness or headache.  No other specific complaints in a complete review of systems (except as listed in HPI above).  Objective  Filed Vitals:    08/17/15 0933  BP: 124/70  Pulse: 93  Temp: 98.6 F (37 C)  TempSrc: Oral  Resp: 16  Weight: 137 lb 3.2 oz (62.234 kg)  SpO2: 95%    Body mass index is 26.8 kg/(m^2).  Physical Exam  Constitutional: Patient appears well-developed and well-nourished. No distress.  HEENT: head atraumatic, normocephalic, pupils equal and reactive to light,  neck supple, throat within normal limits Cardiovascular: Normal rate, regular rhythm and normal heart sounds.  No murmur heard.  No BLE edema. Pulmonary/Chest: Effort normal and breath sounds normal. No respiratory distress. Abdominal: Soft.  There is no tenderness. Psychiatric: Patient has a normal mood and affect. behavior is normal. Judgment and thought content normal.  PHQ2/9: Depression screen [facility] 2/9 08/17/2015 05/19/2015 03/05/2015 12/03/2014  Decreased Interest 0 0 2 1  Down, Depressed, Hopeless 0 1 1 3   PHQ - 2 Score 0 1 3 4   Altered sleeping - - 2 3  Tired, decreased energy - - 3 3  Change in appetite - - 3 3  Feeling bad or failure about yourself  - - 2 0  Trouble concentrating - - 3 1  Moving slowly or fidgety/restless - - 0 0  Suicidal thoughts - - 0 0  PHQ-9 Score - - 16 14  Difficult doing work/chores - - Somewhat difficult Somewhat difficult     Fall Risk: Fall Risk  08/17/2015 05/19/2015 03/05/2015 12/03/2014  Falls in the past year? No No No No     Functional Status Survey: Is the patient deaf or have difficulty hearing?: No Does the patient have difficulty seeing, even when wearing glasses/contacts?: No Does the patient have difficulty concentrating, remembering, or making decisions?: No Does the patient have difficulty walking or climbing stairs?: No Does the patient have difficulty dressing or bathing?: No Does the patient have difficulty doing errands alone such as visiting a doctor's office or shopping?: No    Assessment & Plan  1. CAD in native artery  Continue aspirin and statin therapy  - metoprolol  succinate (TOPROL-XL) 25 MG 24 hr tablet; Take 1 tablet (25 mg total) by mouth daily.  Dispense: 30 tablet; Refill: 5  2. Insomnia  - ALPRAZolam (XANAX) 0.5 MG tablet; Take 1 tablet (0.5 mg total) by mouth 2 (two) times daily as needed. for anxiety  Dispense: 40 tablet; Refill: 1  3. Major depression, recurrent, chronic (HCC)  - DULoxetine (CYMBALTA) 60 MG capsule; TAKE 1 CAPSULE(60 MG) BY MOUTH DAILY  Dispense: 30 capsule; Refill: 5  4. Hyperlipidemia  Continue statin therapy and recheck labs this Summer  5. GAD (generalized anxiety disorder)  - DULoxetine (CYMBALTA) 60 MG capsule; TAKE 1 CAPSULE(60 MG) BY MOUTH DAILY  Dispense: 30 capsule; Refill: 5 - ALPRAZolam (XANAX) 0.5 MG tablet; Take 1 tablet (0.5 mg total) by mouth 2 (two) times daily as needed. for anxiety  Dispense: 40 tablet; Refill: 1  6. Hypothyroidism, adult  Recheck this Summer  7. Insomnia, persistent  - ALPRAZolam (XANAX) 0.5 MG tablet; Take 1 tablet (0.5 mg total) by mouth 2 (two) times daily as needed. for anxiety  Dispense: 40 tablet; Refill: 1

## 2015-08-17 NOTE — Telephone Encounter (Signed)
Patient requesting refill. 

## 2015-10-28 ENCOUNTER — Encounter: Payer: Self-pay | Admitting: Family Medicine

## 2015-10-28 ENCOUNTER — Ambulatory Visit (INDEPENDENT_AMBULATORY_CARE_PROVIDER_SITE_OTHER): Payer: BLUE CROSS/BLUE SHIELD | Admitting: Family Medicine

## 2015-10-28 VITALS — BP 116/74 | HR 86 | Temp 97.6°F | Resp 16 | Ht 60.0 in | Wt 136.7 lb

## 2015-10-28 DIAGNOSIS — G4700 Insomnia, unspecified: Secondary | ICD-10-CM | POA: Diagnosis not present

## 2015-10-28 DIAGNOSIS — F411 Generalized anxiety disorder: Secondary | ICD-10-CM

## 2015-10-28 DIAGNOSIS — F339 Major depressive disorder, recurrent, unspecified: Secondary | ICD-10-CM

## 2015-10-28 MED ORDER — ALPRAZOLAM 0.5 MG PO TABS: 0.5000 mg | ORAL_TABLET | Freq: Two times a day (BID) | ORAL | Status: DC | PRN

## 2015-10-28 MED ORDER — DULOXETINE HCL 30 MG PO CPEP: ORAL_CAPSULE | ORAL | Status: DC

## 2015-10-28 MED ORDER — TRAZODONE HCL 50 MG PO TABS: 25.0000 mg | ORAL_TABLET | Freq: Every evening | ORAL | Status: DC | PRN

## 2015-10-28 NOTE — Progress Notes (Signed)
Name: Denise Mason   MRN: [id number]    DOB: [birth year]   Date:10/28/2015       Progress Note  Subjective  Chief Complaint  Chief Complaint  Patient presents with  . Medication Management    Would like to discuss therapy on medication  . Insomnia    Patient states she has alot going on with her son being rehab due to drug addiction-patient has not been able to sleep. Patient has only been sleeping 4 hours nightly.  . Depression    Would like a increase on medication due to son and neighbor stress, feels like medication is helping but needs a increase to help deal with life situations issues.     HPI    Major Depression and GAD: she has had multiple episode of depression, has tried and not tolerated or not responded to Prozac, Zoloft and Wellbutrin. She states some of the medication makes her very angry and is afraid to take them. Her best friend and neighbor had a nervous breakdown. We started her on Duloxetine back in September, she stated the first week she had nausea and headache, however those symptoms resolved. She was doing well however her adult son was admitted to a detox facility in [place] ( pain medication addiction after second back surgery) He was a Theme park manager, lives in [place], she has been helping her daughter-in-law with her 3 grandchildren.   Insomnia: taking Alprazolam at night, but with the increase in stress she has been having to take up to two pills at night, we will try adding Trazodone  Patient Active Problem List   Diagnosis Date Noted  . Fever blister 08/17/2015  . Breast cyst 07/06/2015  . GAD (generalized anxiety disorder) 03/05/2015  . CAD in native artery 03/05/2015  . Hyperlipidemia 03/05/2015  . H/O total hysterectomy 11/30/2014  . History of basal cell cancer 11/30/2014  . Insomnia, persistent 11/30/2014  . Calcification of abdominal aorta (HCC) 11/16/2009  . Vitamin D deficiency 03/30/2009  . Major depression, recurrent, chronic ([place]) 05/13/2008   . Osteoporosis 08/20/2007  . Hypothyroidism, adult 04/30/2007  . ADD 04/30/2007    Past Surgical History  Procedure Laterality Date  . Total abdominal hysterectomy w/ bilateral salpingoophorectomy  1987  . Cholecystectomy  1999  . Appendectomy    . Breast lumpectomy      L breast  . Breast biopsy Bilateral     FNA    Family History  Problem Relation Age of Onset  . Cancer Mother 2    Breast Cancer  . Breast cancer Mother 24  . Diabetes Father   . Hypertension Father   . Heart attack Maternal Grandmother   . CAD Maternal Grandmother   . CAD Maternal Uncle   . Stroke Paternal Grandmother   . Cancer Sister     Breast Cancer  . Diabetes Sister   . Depression Sister   . Breast cancer Sister 46  . Allergic rhinitis Son     Social History   Social History  . Marital Status: Married    Spouse Name: N/A  . Number of Children: N/A  . Years of Education: N/A   Occupational History  . Not on file.   Social History Main Topics  . Smoking status: Former Smoker -- 1.00 packs/day for 30 years    Types: Cigarettes    Start date: 03/05/1983    Quit date: 06/12/2012  . Smokeless tobacco: Never Used     Comment: 1 ppd for 30 years   .  Alcohol Use: 0.0 oz/week    0 Standard drinks or equivalent per week     Comment: Rarely  . Drug Use: No  . Sexual Activity:    Partners: Male   Other Topics Concern  . Not on file   Social History Narrative   Married, 2 grown children, does not get regular exercise.    Cell phone [phone]      Current outpatient prescriptions:  .  ALPRAZolam (XANAX) 0.5 MG tablet, Take 1 tablet (0.5 mg total) by mouth 2 (two) times daily as needed. for anxiety, Disp: 40 tablet, Rfl: 0 .  aspirin EC 81 MG tablet, Take 1 tablet (81 mg total) by mouth daily., Disp: 30 tablet, Rfl: 5 .  atorvastatin (LIPITOR) 40 MG tablet, Take 1 tablet (40 mg total) by mouth daily., Disp: 30 tablet, Rfl: 5 .  bimatoprost (LATISSE) 0.03 % ophthalmic solution, APPLY  TO UPPER LASH LINE 1 TIME A DAY FOR 30 DAYS AT BEDTIME, Disp: , Rfl: 5 .  DULoxetine (CYMBALTA) 60 MG capsule, TAKE 1 CAPSULE(60 MG) BY MOUTH DAILY, Disp: 30 capsule, Rfl: 5 .  metoprolol succinate (TOPROL-XL) 25 MG 24 hr tablet, Take 1 tablet (25 mg total) by mouth daily., Disp: 30 tablet, Rfl: 5 .  nitroGLYCERIN (NITROSTAT) 0.4 MG SL tablet, Place 1 tablet (0.4 mg total) under the tongue every 5 (five) minutes as needed for chest pain., Disp: 20 tablet, Rfl: 2 .  PREMARIN vaginal cream, Place 1 Applicatorful vaginally 2 (two) times a week., Disp: 42.5 g, Rfl: 12 .  thyroid (ARMOUR THYROID) 30 MG tablet, TAKE 1 TABLET(30 MG) BY MOUTH DAILY, Disp: 30 tablet, Rfl: 5 .  valACYclovir (VALTREX) 1000 MG tablet, , Disp: , Rfl: 3 .  traZODone (DESYREL) 50 MG tablet, Take 0.5-1 tablets (25-50 mg total) by mouth at bedtime as needed for sleep., Disp: 30 tablet, Rfl: 0  Allergies  Allergen Reactions  . Aspirin   . Bupropion Hcl     REACTION: depression  . Varenicline Tartrate     REACTION: depression     ROS  Ten systems reviewed and is negative except as mentioned in HPI  Objective  Filed Vitals:   10/28/15 0950  BP: 116/74  Pulse: 86  Temp: 97.6 F (36.4 C)  TempSrc: Oral  Resp: 16  Height: 5' (1.524 m)  Weight: 136 lb 11.2 oz (62.007 kg)  SpO2: 93%    Body mass index is 26.7 kg/(m^2).  Physical Exam  Constitutional: Patient appears well-developed and well-nourished.  No distress.  HEENT: head atraumatic, normocephalic, pupils equal and reactive to light,neck supple, throat within normal limits Cardiovascular: Normal rate, regular rhythm and normal heart sounds.  No murmur heard. No BLE edema. Pulmonary/Chest: Effort normal and breath sounds normal. No respiratory distress. Abdominal: Soft.  There is no tenderness. Psychiatric: Patient has a depressed mood, crying,  behavior is normal. Judgment and thought content normal.  PHQ2/9: Depression screen [facility] 2/9 10/28/2015 08/17/2015  05/19/2015 03/05/2015 12/03/2014  Decreased Interest 3 0 0 2 1  Down, Depressed, Hopeless 3 0 1 1 3   PHQ - 2 Score 6 0 1 3 4   Altered sleeping 3 - - 2 3  Tired, decreased energy 3 - - 3 3  Change in appetite 3 - - 3 3  Feeling bad or failure about yourself  3 - - 2 0  Trouble concentrating 3 - - 3 1  Moving slowly or fidgety/restless 2 - - 0 0  Suicidal thoughts 0 - -  0 0  PHQ-9 Score 23 - - 16 14  Difficult doing work/chores Somewhat difficult - - Somewhat difficult Somewhat difficult     Fall Risk: Fall Risk  10/28/2015 08/17/2015 05/19/2015 03/05/2015 12/03/2014  Falls in the past year? No No No No No     Functional Status Survey: Is the patient deaf or have difficulty hearing?: No Does the patient have difficulty seeing, even when wearing glasses/contacts?: No Does the patient have difficulty concentrating, remembering, or making decisions?: No Does the patient have difficulty walking or climbing stairs?: No Does the patient have difficulty dressing or bathing?: No Does the patient have difficulty doing errands alone such as visiting a doctor's office or shopping?: No   Assessment & Plan   1. Major depression, recurrent, chronic (HCC)  - DULoxetine (CYMBALTA) 30 MG capsule; Take 3 daily  Dispense: 90 capsule; Refill: 1  2. GAD (generalized anxiety disorder)  - ALPRAZolam (XANAX) 0.5 MG tablet; Take 1 tablet (0.5 mg total) by mouth 2 (two) times daily as needed. for anxiety  Dispense: 40 tablet; Refill: 0 - DULoxetine (CYMBALTA) 30 MG capsule; Take 3 daily  Dispense: 90 capsule; Refill: 1  3. Insomnia, persistent  - traZODone (DESYREL) 50 MG tablet; Take 0.5-1 tablets (25-50 mg total) by mouth at bedtime as needed for sleep.  Dispense: 30 tablet; Refill: 0 - ALPRAZolam (XANAX) 0.5 MG tablet; Take 1 tablet (0.5 mg total) by mouth 2 (two) times daily as needed. for anxiety  Dispense: 40 tablet; Refill: 0

## 2015-11-29 ENCOUNTER — Ambulatory Visit (INDEPENDENT_AMBULATORY_CARE_PROVIDER_SITE_OTHER): Payer: BLUE CROSS/BLUE SHIELD | Admitting: Family Medicine

## 2015-11-29 ENCOUNTER — Encounter: Payer: Self-pay | Admitting: Family Medicine

## 2015-11-29 VITALS — BP 116/68 | HR 89 | Temp 97.9°F | Resp 16 | Ht 60.0 in | Wt 140.1 lb

## 2015-11-29 DIAGNOSIS — G4700 Insomnia, unspecified: Secondary | ICD-10-CM | POA: Diagnosis not present

## 2015-11-29 DIAGNOSIS — F411 Generalized anxiety disorder: Secondary | ICD-10-CM | POA: Diagnosis not present

## 2015-11-29 DIAGNOSIS — F339 Major depressive disorder, recurrent, unspecified: Secondary | ICD-10-CM

## 2015-11-29 MED ORDER — ALPRAZOLAM 0.5 MG PO TABS: 0.5000 mg | ORAL_TABLET | Freq: Two times a day (BID) | ORAL | Status: DC | PRN

## 2015-11-29 NOTE — Progress Notes (Signed)
Name: Denise Mason   MRN: [id number]    DOB: 1955/09/03   Date:11/29/2015       Progress Note  Subjective  Chief Complaint  Chief Complaint  Patient presents with  . Follow-up    1 month   . Insomnia    Added Trazodone on last visit but patient is not able to tolerate it due to making her out of it the next day. Patient states with the combination of upping her depression and medication and the alprazolam she is doing well with her sleep- sleeping 6 hours nightly  . Depression    Since increasing her dosage patient has been feeling better and sleeping better as well.    HPI  Major Depression and GAD: she has had multiple episode of depression, has tried and not tolerated or not responded to Prozac, Zoloft and Wellbutrin. She states some of the medication makes her very angry and is afraid to take them. Her best friend and neighbor had a nervous breakdown. We started her on Duloxetine back in September, she stated the first week she had nausea and headache, however those symptoms resolved. She was doing well however her adult son was admitted to a detox facility in [place] ( pain medication addiction after second back surgery) in April of 2016. He was a Theme park manager, lives in [place], she has been helping her daughter-in-law with her 3 grandchildren. She is not feeling as overwhelmed, recently took all the grandchildren and daughter-in-laws to the beach.   Insomnia: taking Alprazolam at night, she was given Trazodone a couple weeks ago but it made her feel groggy the following day, she is now back on one Alprazolam at night, and sometimes a prn dose during the day and is doing well, since dose of Cymbalta was increased her mood is better.    Patient Active Problem List   Diagnosis Date Noted  . Fever blister 08/17/2015  . Breast cyst 07/06/2015  . GAD (generalized anxiety disorder) 03/05/2015  . CAD in native artery 03/05/2015  . Hyperlipidemia 03/05/2015  . H/O total hysterectomy  11/30/2014  . History of basal cell cancer 11/30/2014  . Insomnia, persistent 11/30/2014  . Calcification of abdominal aorta (HCC) 11/16/2009  . Vitamin D deficiency 03/30/2009  . Major depression, recurrent, chronic ([place]) 05/13/2008  . Osteoporosis 08/20/2007  . Hypothyroidism, adult 04/30/2007  . ADD 04/30/2007    Past Surgical History  Procedure Laterality Date  . Total abdominal hysterectomy w/ bilateral salpingoophorectomy  1987  . Cholecystectomy  1999  . Appendectomy    . Breast lumpectomy      L breast  . Breast biopsy Bilateral     FNA    Family History  Problem Relation Age of Onset  . Cancer Mother 31    Breast Cancer  . Breast cancer Mother 38  . Diabetes Father   . Hypertension Father   . Heart attack Maternal Grandmother   . CAD Maternal Grandmother   . CAD Maternal Uncle   . Stroke Paternal Grandmother   . Cancer Sister     Breast Cancer  . Diabetes Sister   . Depression Sister   . Breast cancer Sister 58  . Allergic rhinitis Son     Social History   Social History  . Marital Status: Married    Spouse Name: N/A  . Number of Children: N/A  . Years of Education: N/A   Occupational History  . Not on file.   Social History Main Topics  .  Smoking status: Former Smoker -- 1.00 packs/day for 30 years    Types: Cigarettes    Start date: 03/05/1983    Quit date: 06/12/2012  . Smokeless tobacco: Never Used     Comment: 1 ppd for 30 years   . Alcohol Use: 0.0 oz/week    0 Standard drinks or equivalent per week     Comment: Rarely  . Drug Use: No  . Sexual Activity:    Partners: Male   Other Topics Concern  . Not on file   Social History Narrative   Married, 2 grown children, does not get regular exercise.    Cell phone [phone]      Current outpatient prescriptions:  .  alendronate (FOSAMAX) 70 MG tablet, Take by mouth., Disp: , Rfl:  .  ALPRAZolam (XANAX) 0.5 MG tablet, Take 1 tablet (0.5 mg total) by mouth 2 (two) times daily as  needed. for anxiety, Disp: 40 tablet, Rfl: 0 .  aspirin EC 81 MG tablet, Take 1 tablet (81 mg total) by mouth daily., Disp: 30 tablet, Rfl: 5 .  atorvastatin (LIPITOR) 40 MG tablet, Take 1 tablet (40 mg total) by mouth daily., Disp: 30 tablet, Rfl: 5 .  bimatoprost (LATISSE) 0.03 % ophthalmic solution, APPLY TO UPPER LASH LINE 1 TIME A DAY FOR 30 DAYS AT BEDTIME, Disp: , Rfl: 5 .  DULoxetine (CYMBALTA) 30 MG capsule, Take 3 daily, Disp: 90 capsule, Rfl: 1 .  metoprolol succinate (TOPROL-XL) 25 MG 24 hr tablet, Take 1 tablet (25 mg total) by mouth daily., Disp: 30 tablet, Rfl: 5 .  nitroGLYCERIN (NITROSTAT) 0.4 MG SL tablet, Place 1 tablet (0.4 mg total) under the tongue every 5 (five) minutes as needed for chest pain., Disp: 20 tablet, Rfl: 2 .  PREMARIN vaginal cream, Place 1 Applicatorful vaginally 2 (two) times a week., Disp: 42.5 g, Rfl: 12 .  thyroid (ARMOUR THYROID) 30 MG tablet, TAKE 1 TABLET(30 MG) BY MOUTH DAILY, Disp: 30 tablet, Rfl: 5 .  valACYclovir (VALTREX) 1000 MG tablet, , Disp: , Rfl: 3  Allergies  Allergen Reactions  . Aspirin   . Bupropion Hcl     REACTION: depression  . Varenicline Tartrate     REACTION: depression     ROS  Ten systems reviewed and is negative except as mentioned in HPI   Objective  Filed Vitals:   11/29/15 0856  BP: 116/68  Pulse: 89  Temp: 97.9 F (36.6 C)  TempSrc: Oral  Resp: 16  Height: 5' (1.524 m)  Weight: 140 lb 1.6 oz (63.549 kg)  SpO2: 96%    Body mass index is 27.36 kg/(m^2).  Physical Exam   Constitutional: Patient appears well-developed and well-nourished.  No distress.  HEENT: head atraumatic, normocephalic, pupils equal and reactive to light, neck supple, throat within normal limits Cardiovascular: Normal rate, regular rhythm and normal heart sounds.  No murmur heard. No BLE edema. Pulmonary/Chest: Effort normal and breath sounds normal. No respiratory distress. Abdominal: Soft.  There is no tenderness. Psychiatric:  Patient has a normal mood and affect. behavior is normal. Judgment and thought content normal.  PHQ2/9: Depression screen [facility] 2/9 11/29/2015 10/28/2015 08/17/2015 05/19/2015 03/05/2015  Decreased Interest 1 3 0 0 2  Down, Depressed, Hopeless 1 3 0 1 1  PHQ - 2 Score 2 6 0 1 3  Altered sleeping 2 3 - - 2  Tired, decreased energy 1 3 - - 3  Change in appetite 2 3 - - 3  Feeling bad  or failure about yourself  2 3 - - 2  Trouble concentrating 1 3 - - 3  Moving slowly or fidgety/restless 0 2 - - 0  Suicidal thoughts 0 0 - - 0  PHQ-9 Score 10 23 - - 16  Difficult doing work/chores Somewhat difficult Somewhat difficult - - Somewhat difficult     Fall Risk: Fall Risk  10/28/2015 08/17/2015 05/19/2015 03/05/2015 12/03/2014  Falls in the past year? No No No No No      Assessment & Plan  1. Major depression, recurrent, chronic (HCC)  Taking Cymbalta and is doing well on 90 mg daily   2. GAD (generalized anxiety disorder)  - ALPRAZolam (XANAX) 0.5 MG tablet; Take 1 tablet (0.5 mg total) by mouth 2 (two) times daily as needed. for anxiety  Dispense: 40 tablet; Refill: 0  3. Insomnia  - ALPRAZolam (XANAX) 0.5 MG tablet; Take 1 tablet (0.5 mg total) by mouth 2 (two) times daily as needed. for anxiety  Dispense: 40 tablet; Refill: 0

## 2015-12-19 ENCOUNTER — Other Ambulatory Visit: Payer: Self-pay | Admitting: Family Medicine

## 2015-12-20 ENCOUNTER — Other Ambulatory Visit: Payer: Self-pay | Admitting: Family Medicine

## 2015-12-20 NOTE — Telephone Encounter (Signed)
Patient requesting refill. 

## 2015-12-21 ENCOUNTER — Other Ambulatory Visit: Payer: Self-pay | Admitting: Family Medicine

## 2015-12-21 ENCOUNTER — Encounter: Payer: Self-pay | Admitting: Family Medicine

## 2015-12-21 NOTE — Telephone Encounter (Signed)
Patient requesting refill. 

## 2015-12-22 ENCOUNTER — Other Ambulatory Visit: Payer: Self-pay | Admitting: Family Medicine

## 2015-12-22 ENCOUNTER — Encounter: Payer: Self-pay | Admitting: Family Medicine

## 2015-12-22 DIAGNOSIS — F411 Generalized anxiety disorder: Secondary | ICD-10-CM

## 2015-12-22 DIAGNOSIS — G4700 Insomnia, unspecified: Secondary | ICD-10-CM

## 2015-12-22 MED ORDER — ALPRAZOLAM 0.5 MG PO TABS: 0.5000 mg | ORAL_TABLET | Freq: Two times a day (BID) | ORAL | Status: DC | PRN

## 2015-12-29 ENCOUNTER — Ambulatory Visit: Payer: BLUE CROSS/BLUE SHIELD | Admitting: Family Medicine

## 2015-12-31 ENCOUNTER — Ambulatory Visit: Payer: BLUE CROSS/BLUE SHIELD | Admitting: Family Medicine

## 2016-01-03 ENCOUNTER — Encounter: Payer: Self-pay | Admitting: Family Medicine

## 2016-01-03 ENCOUNTER — Ambulatory Visit (INDEPENDENT_AMBULATORY_CARE_PROVIDER_SITE_OTHER): Payer: BLUE CROSS/BLUE SHIELD | Admitting: Family Medicine

## 2016-01-03 VITALS — BP 116/68 | HR 85 | Temp 97.8°F | Resp 16 | Ht 60.0 in | Wt 137.5 lb

## 2016-01-03 DIAGNOSIS — G4700 Insomnia, unspecified: Secondary | ICD-10-CM

## 2016-01-03 DIAGNOSIS — F339 Major depressive disorder, recurrent, unspecified: Secondary | ICD-10-CM | POA: Diagnosis not present

## 2016-01-03 DIAGNOSIS — F411 Generalized anxiety disorder: Secondary | ICD-10-CM

## 2016-01-03 MED ORDER — DULOXETINE HCL 30 MG PO CPEP: ORAL_CAPSULE | ORAL | 2 refills | Status: DC

## 2016-01-03 NOTE — Progress Notes (Signed)
Name: Denise Mason   MRN: [id number]    DOB: 04/10/1956   Date:01/03/2016       Progress Note  Subjective  Chief Complaint  Chief Complaint  Patient presents with  . Follow-up    1 month  . Depression    Doing well with increasing dosage of Cymbalta, son came home from Rehab on Friday and is adjusting well.   . Insomnia    Medication is helping patient fall and stay asleep, sleeping 6-7 hours nightly with medication    HPI  Major Depression and GAD: she has had multiple episode of depression, has tried and not tolerated or not responded to Prozac, Zoloft and Wellbutrin. She states some of the medication makes her very angry and is afraid to take them. Her best friend and neighbor had a nervous breakdown. We started her on Duloxetine back in September, she stated the first week she had nausea and headache, however those symptoms resolved. She was doing well however her adult son was admitted to a detox facility in [place] ( pain medication addiction after second back surgery) in April of 2016. He was a Theme park manager, lives in [place], he is now back at home, he finished rehab and she is feeling better emotionally.   Insomnia: taking Alprazolam at night, she was given Trazodone a couple weeks ago but it made her feel groggy the following day, she is now back on one Alprazolam at night, and sometimes a prn dose during the day and is doing well.   Patient Active Problem List   Diagnosis Date Noted  . Fever blister 08/17/2015  . Breast cyst 07/06/2015  . GAD (generalized anxiety disorder) 03/05/2015  . CAD in native artery 03/05/2015  . Hyperlipidemia 03/05/2015  . H/O total hysterectomy 11/30/2014  . History of basal cell cancer 11/30/2014  . Insomnia, persistent 11/30/2014  . Calcification of abdominal aorta (HCC) 11/16/2009  . Vitamin D deficiency 03/30/2009  . Major depression, recurrent, chronic ([place]) 05/13/2008  . Osteoporosis 08/20/2007  . Hypothyroidism, adult 04/30/2007  .  ADD 04/30/2007    Past Surgical History:  Procedure Laterality Date  . APPENDECTOMY    . BREAST BIOPSY Bilateral    FNA  . BREAST LUMPECTOMY     L breast  . CHOLECYSTECTOMY  1999  . TOTAL ABDOMINAL HYSTERECTOMY W/ BILATERAL SALPINGOOPHORECTOMY  1987    Family History  Problem Relation Age of Onset  . Cancer Mother 68    Breast Cancer  . Breast cancer Mother 51  . Diabetes Father   . Hypertension Father   . Heart attack Maternal Grandmother   . CAD Maternal Grandmother   . CAD Maternal Uncle   . Stroke Paternal Grandmother   . Cancer Sister     Breast Cancer  . Diabetes Sister   . Depression Sister   . Breast cancer Sister 46  . Allergic rhinitis Son     Social History   Social History  . Marital status: Married    Spouse name: N/A  . Number of children: N/A  . Years of education: N/A   Occupational History  . Not on file.   Social History Main Topics  . Smoking status: Former Smoker    Packs/day: 1.00    Years: 30.00    Types: Cigarettes    Start date: 03/05/1983    Quit date: 06/12/2012  . Smokeless tobacco: Never Used     Comment: 1 ppd for 30 years   . Alcohol use 0.0 oz/week  Comment: Rarely  . Drug use: No  . Sexual activity: Yes    Partners: Male   Other Topics Concern  . Not on file   Social History Narrative   Married, 2 grown children, does not get regular exercise.    Cell phone [phone]      Current Outpatient Prescriptions:  .  alendronate (FOSAMAX) 70 MG tablet, Take by mouth., Disp: , Rfl:  .  ALPRAZolam (XANAX) 0.5 MG tablet, Take 1 tablet (0.5 mg total) by mouth 2 (two) times daily as needed. for anxiety, Disp: 60 tablet, Rfl: 0 .  aspirin EC 81 MG tablet, Take 1 tablet (81 mg total) by mouth daily., Disp: 30 tablet, Rfl: 5 .  atorvastatin (LIPITOR) 40 MG tablet, Take 1 tablet (40 mg total) by mouth daily., Disp: 30 tablet, Rfl: 5 .  DULoxetine (CYMBALTA) 30 MG capsule, Take 3 daily, Disp: 90 capsule, Rfl: 2 .  metoprolol  succinate (TOPROL-XL) 25 MG 24 hr tablet, Take 1 tablet (25 mg total) by mouth daily., Disp: 30 tablet, Rfl: 5 .  nitroGLYCERIN (NITROSTAT) 0.4 MG SL tablet, Place 1 tablet (0.4 mg total) under the tongue every 5 (five) minutes as needed for chest pain., Disp: 20 tablet, Rfl: 2 .  PREMARIN vaginal cream, Place 1 Applicatorful vaginally 2 (two) times a week., Disp: 42.5 g, Rfl: 12 .  thyroid (ARMOUR THYROID) 30 MG tablet, TAKE 1 TABLET(30 MG) BY MOUTH DAILY, Disp: 30 tablet, Rfl: 5 .  valACYclovir (VALTREX) 1000 MG tablet, , Disp: , Rfl: 3  Allergies  Allergen Reactions  . Aspirin   . Bupropion Hcl     REACTION: depression  . Varenicline Tartrate     REACTION: depression     ROS  Constitutional: Negative for fever or weight change.  Respiratory: Negative for cough and shortness of breath.   Cardiovascular: Negative for chest pain or palpitations.  Gastrointestinal: Negative for abdominal pain, no bowel changes.  Musculoskeletal: Negative for gait problem or joint swelling.  Skin: Negative for rash.  Neurological: Negative for dizziness or headache.  No other specific complaints in a complete review of systems (except as listed in HPI above).  Objective  Vitals:   01/03/16 1122  BP: 116/68  Pulse: 85  Resp: 16  Temp: 97.8 F (36.6 C)  TempSrc: Oral  SpO2: 97%  Weight: 137 lb 8 oz (62.4 kg)  Height: 5' (1.524 m)    Body mass index is 26.85 kg/m.  Physical Exam  Constitutional: Patient appears well-developed and well-nourished.  No distress.  HEENT: head atraumatic, normocephalic, pupils equal and reactive to light, eck supple, throat within normal limits Cardiovascular: Normal rate, regular rhythm and normal heart sounds.  No murmur heard. No BLE edema. Pulmonary/Chest: Effort normal and breath sounds normal. No respiratory distress. Abdominal: Soft.  There is no tenderness. Psychiatric: Patient has a normal mood and affect. behavior is normal. Judgment and thought  content normal.  PHQ2/9: Depression screen [facility] 2/9 01/03/2016 11/29/2015 10/28/2015 08/17/2015 05/19/2015  Decreased Interest 2 1 3  0 0  Down, Depressed, Hopeless 0 1 3 0 1  PHQ - 2 Score 2 2 6  0 1  Altered sleeping 2 2 3  - -  Tired, decreased energy 1 1 3  - -  Change in appetite 2 2 3  - -  Feeling bad or failure about yourself  3 2 3  - -  Trouble concentrating 0 1 3 - -  Moving slowly or fidgety/restless 0 0 2 - -  Suicidal thoughts  0 0 0 - -  PHQ-9 Score 10 10 23  - -  Difficult doing work/chores Somewhat difficult Somewhat difficult Somewhat difficult - -    Fall Risk: Fall Risk  10/28/2015 08/17/2015 05/19/2015 03/05/2015 12/03/2014  Falls in the past year? No No No No No      Assessment & Plan  1. Major depression, recurrent, chronic (HCC)  - DULoxetine (CYMBALTA) 30 MG capsule; Take 3 daily  Dispense: 90 capsule; Refill: 2  2. GAD (generalized anxiety disorder)  - DULoxetine (CYMBALTA) 30 MG capsule; Take 3 daily  Dispense: 90 capsule; Refill: 2  3. Insomnia  She will call for refills

## 2016-01-06 ENCOUNTER — Other Ambulatory Visit: Payer: Self-pay | Admitting: Family Medicine

## 2016-01-06 DIAGNOSIS — N952 Postmenopausal atrophic vaginitis: Secondary | ICD-10-CM

## 2016-01-06 NOTE — Telephone Encounter (Signed)
Patient requesting refill. 

## 2016-01-19 ENCOUNTER — Other Ambulatory Visit: Payer: Self-pay | Admitting: Family Medicine

## 2016-01-19 DIAGNOSIS — G4700 Insomnia, unspecified: Secondary | ICD-10-CM

## 2016-01-19 DIAGNOSIS — F411 Generalized anxiety disorder: Secondary | ICD-10-CM

## 2016-01-19 NOTE — Telephone Encounter (Signed)
Patient requesting refill of Xanax, [place] be sent to [facility] [phone].

## 2016-01-20 ENCOUNTER — Encounter: Payer: Self-pay | Admitting: Family Medicine

## 2016-02-21 ENCOUNTER — Other Ambulatory Visit: Payer: Self-pay | Admitting: Family Medicine

## 2016-02-21 ENCOUNTER — Encounter: Payer: Self-pay | Admitting: Family Medicine

## 2016-02-21 DIAGNOSIS — F411 Generalized anxiety disorder: Secondary | ICD-10-CM

## 2016-02-21 DIAGNOSIS — N952 Postmenopausal atrophic vaginitis: Secondary | ICD-10-CM

## 2016-02-21 DIAGNOSIS — G4700 Insomnia, unspecified: Secondary | ICD-10-CM

## 2016-03-07 ENCOUNTER — Encounter: Payer: Self-pay | Admitting: Family Medicine

## 2016-03-07 ENCOUNTER — Ambulatory Visit (INDEPENDENT_AMBULATORY_CARE_PROVIDER_SITE_OTHER): Payer: BLUE CROSS/BLUE SHIELD | Admitting: Family Medicine

## 2016-03-07 VITALS — BP 114/72 | HR 96 | Temp 98.2°F | Wt 138.3 lb

## 2016-03-07 DIAGNOSIS — Z23 Encounter for immunization: Secondary | ICD-10-CM

## 2016-03-07 DIAGNOSIS — E039 Hypothyroidism, unspecified: Secondary | ICD-10-CM

## 2016-03-07 DIAGNOSIS — G4700 Insomnia, unspecified: Secondary | ICD-10-CM

## 2016-03-07 DIAGNOSIS — I700 Atherosclerosis of aorta: Secondary | ICD-10-CM | POA: Diagnosis not present

## 2016-03-07 DIAGNOSIS — F339 Major depressive disorder, recurrent, unspecified: Secondary | ICD-10-CM

## 2016-03-07 DIAGNOSIS — E785 Hyperlipidemia, unspecified: Secondary | ICD-10-CM | POA: Diagnosis not present

## 2016-03-07 DIAGNOSIS — Z79899 Other long term (current) drug therapy: Secondary | ICD-10-CM | POA: Diagnosis not present

## 2016-03-07 DIAGNOSIS — M810 Age-related osteoporosis without current pathological fracture: Secondary | ICD-10-CM | POA: Diagnosis not present

## 2016-03-07 DIAGNOSIS — I2510 Atherosclerotic heart disease of native coronary artery without angina pectoris: Secondary | ICD-10-CM

## 2016-03-07 DIAGNOSIS — F411 Generalized anxiety disorder: Secondary | ICD-10-CM

## 2016-03-07 DIAGNOSIS — Z131 Encounter for screening for diabetes mellitus: Secondary | ICD-10-CM

## 2016-03-07 DIAGNOSIS — R5383 Other fatigue: Secondary | ICD-10-CM | POA: Diagnosis not present

## 2016-03-07 MED ORDER — ALENDRONATE SODIUM 70 MG PO TABS: 70.0000 mg | ORAL_TABLET | ORAL | 4 refills | Status: DC

## 2016-03-07 MED ORDER — METOPROLOL SUCCINATE ER 25 MG PO TB24: 25.0000 mg | ORAL_TABLET | Freq: Every day | ORAL | 5 refills | Status: DC

## 2016-03-07 MED ORDER — DULOXETINE HCL 60 MG PO CPEP: ORAL_CAPSULE | ORAL | 2 refills | Status: DC

## 2016-03-07 MED ORDER — DULOXETINE HCL 30 MG PO CPEP: ORAL_CAPSULE | ORAL | 2 refills | Status: DC

## 2016-03-07 NOTE — Progress Notes (Signed)
Name: Denise Mason   MRN: [id number]    DOB: [birth year]   Date:03/07/2016       Progress Note  Subjective  Chief Complaint  Chief Complaint  Patient presents with  . Follow-up    HPI  Major Depression and GAD: she has had multiple episodes of depression, has tried and not tolerated or not responded to Prozac, Zoloft and Wellbutrin. She states some of the medication makes her very angry and is afraid to take them. We started her on Duloxetine back in September 2016 , she stated the first week she had nausea and headache, however those symptoms resolved. She was doing well however her adult son was admitted to a detox facility in [place] ( pain medication addiction after second back surgery) She is now down to 60 mg daily of Cymbalta. Son is doing well, back to work and crisis mode has resolved. . She is not feeling as overwhelmed.   Insomnia: taking she is taking Alprazolam at night one to 1.5 pills per night,no longer taking it during the day, less stressed since they sold her house in [place] and moved to [place], [place] - closer to her son's house  CAD and Precordial chest pain: she had a cath done in 2010 that showed mild LAD lesion. She was seen in June 2016 and had an episode of substernal chest pain, heavy sensation, that was triggered by stress. She was seen by Dr. Saralyn Pilar and had a negative echo stress test. LAD lesion was stable, she is on Toprol XL, aspirin and Atorvastatin - but not very compliant with statin. One recent episode of chest pain when she got a phone call that her husband was in  MVA , pain lasted for about one hour, but was intermittent until she got in the hospital to see her husband  Hyperlipidemia: started on statin in June 2016, denies side effects, LDL was 118, but HDL has improved, she keeps forgetting to take Atorvastatin at night, advised her to take in am.   Hypothyroidism: always has dry skin, weight has been stable, however she has lost hair loss and is  feeling more tired than usual.  Last TSH at goal but not checked in one year.   Patient Active Problem List   Diagnosis Date Noted  . Fever blister 08/17/2015  . Breast cyst 07/06/2015  . GAD (generalized anxiety disorder) 03/05/2015  . CAD in native artery 03/05/2015  . Hyperlipidemia 03/05/2015  . H/O total hysterectomy 11/30/2014  . History of basal cell cancer 11/30/2014  . Insomnia, persistent 11/30/2014  . Calcification of abdominal aorta (HCC) 11/16/2009  . Vitamin D deficiency 03/30/2009  . Major depression, recurrent, chronic ([place]) 05/13/2008  . Osteoporosis 08/20/2007  . Hypothyroidism, adult 04/30/2007  . ADD 04/30/2007    Past Surgical History:  Procedure Laterality Date  . APPENDECTOMY    . BREAST BIOPSY Bilateral    FNA  . BREAST LUMPECTOMY     L breast  . CHOLECYSTECTOMY  1999  . TOTAL ABDOMINAL HYSTERECTOMY W/ BILATERAL SALPINGOOPHORECTOMY  1987    Family History  Problem Relation Age of Onset  . Cancer Mother 82    Breast Cancer  . Breast cancer Mother 2  . Diabetes Father   . Hypertension Father   . Heart attack Maternal Grandmother   . CAD Maternal Grandmother   . CAD Maternal Uncle   . Stroke Paternal Grandmother   . Cancer Sister     Breast Cancer  . Diabetes Sister   .  Depression Sister   . Breast cancer Sister 75  . Allergic rhinitis Son     Social History   Social History  . Marital status: Married    Spouse name: N/A  . Number of children: N/A  . Years of education: N/A   Occupational History  . Not on file.   Social History Main Topics  . Smoking status: Former Smoker    Packs/day: 1.00    Years: 30.00    Types: Cigarettes    Start date: 03/05/1983    Quit date: 06/12/2012  . Smokeless tobacco: Never Used     Comment: 1 ppd for 30 years   . Alcohol use 0.0 oz/week     Comment: Rarely  . Drug use: No  . Sexual activity: Yes    Partners: Male   Other Topics Concern  . Not on file   Social History Narrative    Married, 2 grown children, does not get regular exercise.    Cell phone [phone]      Current Outpatient Prescriptions:  .  alendronate (FOSAMAX) 70 MG tablet, Take 1 tablet (70 mg total) by mouth once a week., Disp: 12 tablet, Rfl: 4 .  ALPRAZolam (XANAX) 0.5 MG tablet, TAKE 1 TABLET BY MOUTH TWICE DAILY AS NEEDED FOR ANXIETY, Disp: 60 tablet, Rfl: 0 .  ARMOUR THYROID 30 MG tablet, TAKE 1 TABLET(30 MG) BY MOUTH DAILY, Disp: 30 tablet, Rfl: 2 .  aspirin EC 81 MG tablet, Take 1 tablet (81 mg total) by mouth daily., Disp: 30 tablet, Rfl: 5 .  atorvastatin (LIPITOR) 40 MG tablet, Take 1 tablet (40 mg total) by mouth daily., Disp: 30 tablet, Rfl: 5 .  DULoxetine (CYMBALTA) 30 MG capsule, Take 3 daily, Disp: 90 capsule, Rfl: 2 .  metoprolol succinate (TOPROL-XL) 25 MG 24 hr tablet, Take 1 tablet (25 mg total) by mouth daily., Disp: 30 tablet, Rfl: 5 .  nitroGLYCERIN (NITROSTAT) 0.4 MG SL tablet, Place 1 tablet (0.4 mg total) under the tongue every 5 (five) minutes as needed for chest pain., Disp: 20 tablet, Rfl: 2 .  PREMARIN vaginal cream, INSERT 1 APPLICATORFUL VAGINALLY 2 TIMES A WEEK, Disp: 30 g, Rfl: 0 .  valACYclovir (VALTREX) 1000 MG tablet, , Disp: , Rfl: 3  Allergies  Allergen Reactions  . Aspirin   . Bupropion Hcl     REACTION: depression  . Varenicline Tartrate     REACTION: depression     ROS  Constitutional: Negative for fever or weight change.  Respiratory: Negative for cough and shortness of breath.   Cardiovascular: Negative for chest pain or palpitations.  Gastrointestinal: Negative for abdominal pain, no bowel changes.  Musculoskeletal: Negative for gait problem or joint swelling.  Skin: Negative for rash.  Neurological: Negative for dizziness or headache.  No other specific complaints in a complete review of systems (except as listed in HPI above).  Objective  Vitals:   03/07/16 0909  BP: 114/72  Pulse: 96  Temp: 98.2 F (36.8 C)  SpO2: 97%  Weight: 138 lb  4.8 oz (62.7 kg)    Body mass index is 27.01 kg/m.  Physical Exam  Constitutional: Patient appears well-developed and well-nourished.  No distress.  HEENT: head atraumatic, normocephalic, pupils equal and reactive to light,  neck supple, throat within normal limits Cardiovascular: Normal rate, regular rhythm and normal heart sounds.  No murmur heard. No BLE edema. Pulmonary/Chest: Effort normal and breath sounds normal. No respiratory distress. Abdominal: Soft.  There is no tenderness.  Psychiatric: Patient has a normal mood and affect. behavior is normal. Judgment and thought content normal.  PHQ2/9: Depression screen [facility] 2/9 03/07/2016 01/03/2016 11/29/2015 10/28/2015 08/17/2015  Decreased Interest 0 2 1 3  0  Down, Depressed, Hopeless 0 0 1 3 0  PHQ - 2 Score 0 2 2 6  0  Altered sleeping - 2 2 3  -  Tired, decreased energy - 1 1 3  -  Change in appetite - 2 2 3  -  Feeling bad or failure about yourself  - 3 2 3  -  Trouble concentrating - 0 1 3 -  Moving slowly or fidgety/restless - 0 0 2 -  Suicidal thoughts - 0 0 0 -  PHQ-9 Score - 10 10 23  -  Difficult doing work/chores - Somewhat difficult Somewhat difficult Somewhat difficult -    Fall Risk: Fall Risk  03/07/2016 10/28/2015 08/17/2015 05/19/2015 03/05/2015  Falls in the past year? No No No No No      Functional Status Survey: Is the patient deaf or have difficulty hearing?: No Does the patient have difficulty seeing, even when wearing glasses/contacts?: Yes (glasses) Does the patient have difficulty concentrating, remembering, or making decisions?: No Does the patient have difficulty walking or climbing stairs?: No Does the patient have difficulty dressing or bathing?: No Does the patient have difficulty doing errands alone such as visiting a doctor's office or shopping?: No    Assessment & Plan  1. Major depression, recurrent, chronic (HCC)  - DULoxetine (CYMBALTA) 60 MG capsule; Daily  Dispense: 30 capsule; Refill: 2  2.  GAD (generalized anxiety disorder)  - DULoxetine (CYMBALTA) 60 MG capsule; Daily  Dispense: 30 capsule; Refill: 2  3. Insomnia, persistent  Discussed weaning down on Alprazolam and she will call me with the amount of pills that she will need for her next refill  4. CAD in native artery  - metoprolol succinate (TOPROL-XL) 25 MG 24 hr tablet; Take 1 tablet (25 mg total) by mouth daily.  Dispense: 30 tablet; Refill: 5  5. Needs flu shot  - Flu Vaccine QUAD 36+ mos IM  6. Calcification of abdominal aorta (HCC)  Needs to resume statin therapy   7. Hypothyroidism, adult  - TSH  8. Hyperlipidemia  - Lipid panel  9. Screening for diabetes mellitus (DM)  - Hemoglobin A1c  10. Long-term use of high-risk medication  - Comprehensive metabolic panel  11. Other fatigue  - Vitamin B12 - VITAMIN D 25 Hydroxy (Vit-D Deficiency, Fractures) - CBC with Differential/Platelet  12. Osteoporosis  - alendronate (FOSAMAX) 70 MG tablet; Take 1 tablet (70 mg total) by mouth once a week.  Dispense: 12 tablet; Refill: 4

## 2016-03-20 ENCOUNTER — Encounter: Payer: Self-pay | Admitting: Family Medicine

## 2016-03-20 ENCOUNTER — Other Ambulatory Visit: Payer: Self-pay | Admitting: Family Medicine

## 2016-03-20 DIAGNOSIS — F411 Generalized anxiety disorder: Secondary | ICD-10-CM

## 2016-03-20 MED ORDER — ALPRAZOLAM 0.5 MG PO TABS: 0.5000 mg | ORAL_TABLET | Freq: Two times a day (BID) | ORAL | 0 refills | Status: DC | PRN

## 2016-04-01 LAB — COMPREHENSIVE METABOLIC PANEL
ALK PHOS: 132 IU/L — AB (ref 39–117)
ALT: 15 IU/L (ref 0–32)
AST: 20 IU/L (ref 0–40)
Albumin/Globulin Ratio: 1.8 (ref 1.2–2.2)
Albumin: 4.6 g/dL (ref 3.5–5.5)
BILIRUBIN TOTAL: 0.3 mg/dL (ref 0.0–1.2)
BUN/Creatinine Ratio: 11 (ref 9–23)
BUN: 9 mg/dL (ref 6–24)
CHLORIDE: 97 mmol/L (ref 96–106)
CO2: 24 mmol/L (ref 18–29)
CREATININE: 0.85 mg/dL (ref 0.57–1.00)
Calcium: 9.8 mg/dL (ref 8.7–10.2)
GFR calc Af Amer: 87 mL/min/{1.73_m2} (ref >59)
GFR calc non Af Amer: 76 mL/min/{1.73_m2} (ref >59)
GLUCOSE: 88 mg/dL (ref 65–99)
Globulin, Total: 2.5 g/dL (ref 1.5–4.5)
Potassium: 4.7 mmol/L (ref 3.5–5.2)
Sodium: 141 mmol/L (ref 134–144)
Total Protein: 7.1 g/dL (ref 6.0–8.5)

## 2016-04-01 LAB — CBC WITH DIFFERENTIAL/PLATELET
BASOS ABS: 0 10*3/uL (ref 0.0–0.2)
Basos: 1 %
EOS (ABSOLUTE): 0.2 10*3/uL (ref 0.0–0.4)
Eos: 3 %
HEMOGLOBIN: 13.6 g/dL (ref 11.1–15.9)
Hematocrit: 41.2 % (ref 34.0–46.6)
IMMATURE GRANS (ABS): 0 10*3/uL (ref 0.0–0.1)
IMMATURE GRANULOCYTES: 0 %
LYMPHS: 24 %
Lymphocytes Absolute: 1.4 10*3/uL (ref 0.7–3.1)
MCH: 27.8 pg (ref 26.6–33.0)
MCHC: 33 g/dL (ref 31.5–35.7)
MCV: 84 fL (ref 79–97)
MONOCYTES: 9 %
Monocytes Absolute: 0.5 10*3/uL (ref 0.1–0.9)
NEUTROS ABS: 3.5 10*3/uL (ref 1.4–7.0)
NEUTROS PCT: 63 %
PLATELETS: 355 10*3/uL (ref 150–379)
RBC: 4.9 x10E6/uL (ref 3.77–5.28)
RDW: 12.9 % (ref 12.3–15.4)
WBC: 5.6 10*3/uL (ref 3.4–10.8)

## 2016-04-01 LAB — VITAMIN D 25 HYDROXY (VIT D DEFICIENCY, FRACTURES): VIT D 25 HYDROXY: 38.3 ng/mL (ref 30.0–100.0)

## 2016-04-01 LAB — LIPID PANEL
CHOLESTEROL TOTAL: 226 mg/dL — AB (ref 100–199)
Chol/HDL Ratio: 4.5 ratio units — ABNORMAL HIGH (ref 0.0–4.4)
HDL: 50 mg/dL (ref >39)
LDL CALC: 142 mg/dL — AB (ref 0–99)
TRIGLYCERIDES: 168 mg/dL — AB (ref 0–149)
VLDL CHOLESTEROL CAL: 34 mg/dL (ref 5–40)

## 2016-04-01 LAB — VITAMIN B12: VITAMIN B 12: 279 pg/mL (ref 211–946)

## 2016-04-01 LAB — TSH: TSH: 3.34 u[IU]/mL (ref 0.450–4.500)

## 2016-04-01 LAB — HEMOGLOBIN A1C
ESTIMATED AVERAGE GLUCOSE: 114 mg/dL
HEMOGLOBIN A1C: 5.6 % (ref 4.8–5.6)

## 2016-04-06 ENCOUNTER — Other Ambulatory Visit: Payer: Self-pay | Admitting: Family Medicine

## 2016-04-06 DIAGNOSIS — N952 Postmenopausal atrophic vaginitis: Secondary | ICD-10-CM

## 2016-04-07 NOTE — Telephone Encounter (Signed)
Patient requesting refill of Premarin Cream.

## 2016-04-18 ENCOUNTER — Encounter: Payer: Self-pay | Admitting: Family Medicine

## 2016-04-20 ENCOUNTER — Other Ambulatory Visit: Payer: Self-pay | Admitting: Family Medicine

## 2016-04-20 DIAGNOSIS — F411 Generalized anxiety disorder: Secondary | ICD-10-CM

## 2016-04-20 MED ORDER — ALPRAZOLAM 0.5 MG PO TABS: 0.5000 mg | ORAL_TABLET | Freq: Two times a day (BID) | ORAL | 0 refills | Status: DC | PRN

## 2016-04-25 ENCOUNTER — Other Ambulatory Visit: Payer: Self-pay | Admitting: Family Medicine

## 2016-04-25 ENCOUNTER — Encounter: Payer: Self-pay | Admitting: Family Medicine

## 2016-04-25 MED ORDER — FLUCONAZOLE 150 MG PO TABS: 150.0000 mg | ORAL_TABLET | ORAL | 0 refills | Status: DC

## 2016-05-03 ENCOUNTER — Other Ambulatory Visit: Payer: Self-pay | Admitting: Family Medicine

## 2016-05-03 DIAGNOSIS — Z1231 Encounter for screening mammogram for malignant neoplasm of breast: Secondary | ICD-10-CM

## 2016-05-16 ENCOUNTER — Other Ambulatory Visit: Payer: Self-pay | Admitting: Family Medicine

## 2016-05-16 DIAGNOSIS — N952 Postmenopausal atrophic vaginitis: Secondary | ICD-10-CM

## 2016-06-02 ENCOUNTER — Ambulatory Visit (INDEPENDENT_AMBULATORY_CARE_PROVIDER_SITE_OTHER): Payer: BLUE CROSS/BLUE SHIELD | Admitting: Family Medicine

## 2016-06-02 ENCOUNTER — Encounter: Payer: Self-pay | Admitting: Family Medicine

## 2016-06-02 VITALS — BP 110/64 | HR 88 | Temp 98.7°F | Resp 16 | Ht 60.0 in | Wt 139.1 lb

## 2016-06-02 DIAGNOSIS — I700 Atherosclerosis of aorta: Secondary | ICD-10-CM

## 2016-06-02 DIAGNOSIS — G4700 Insomnia, unspecified: Secondary | ICD-10-CM | POA: Diagnosis not present

## 2016-06-02 DIAGNOSIS — E782 Mixed hyperlipidemia: Secondary | ICD-10-CM

## 2016-06-02 DIAGNOSIS — I2510 Atherosclerotic heart disease of native coronary artery without angina pectoris: Secondary | ICD-10-CM

## 2016-06-02 DIAGNOSIS — F339 Major depressive disorder, recurrent, unspecified: Secondary | ICD-10-CM

## 2016-06-02 DIAGNOSIS — E039 Hypothyroidism, unspecified: Secondary | ICD-10-CM | POA: Diagnosis not present

## 2016-06-02 DIAGNOSIS — F411 Generalized anxiety disorder: Secondary | ICD-10-CM

## 2016-06-02 MED ORDER — THYROID 30 MG PO TABS: ORAL_TABLET | ORAL | 5 refills | Status: DC

## 2016-06-02 MED ORDER — DULOXETINE HCL 60 MG PO CPEP: ORAL_CAPSULE | ORAL | 2 refills | Status: DC

## 2016-06-02 MED ORDER — ALPRAZOLAM 0.5 MG PO TABS: 0.5000 mg | ORAL_TABLET | Freq: Two times a day (BID) | ORAL | 0 refills | Status: DC | PRN

## 2016-06-02 MED ORDER — ROSUVASTATIN CALCIUM 10 MG PO TABS: 10.0000 mg | ORAL_TABLET | Freq: Every day | ORAL | 2 refills | Status: DC

## 2016-06-02 NOTE — Progress Notes (Signed)
Name: Denise Mason   MRN: [id number]    DOB: 03/24/1956   Date:06/02/2016       Progress Note  Subjective  Chief Complaint  Chief Complaint  Patient presents with  . Depression    3 month follow up pt has not had any issues   . Hyperlipidemia  . Anxiety  . Insomnia    pt states that she has been waking up more often    HPI  Major Depression and GAD: she has had multiple episodes of depression, has tried and not tolerated or not responded to Prozac, Zoloft and Wellbutrin. She states some of the medication makes her very angry and is afraid to take them. We started her on Duloxetine back in September 2016 , she stated the first week she had nausea and headache, however those symptoms resolved. She was doing well however her adult son was admitted to a detox facility in [place] ( pain medication addiction after second back surgery- back in April 2017) She is now down to 60 mg daily of Cymbalta. Son is doing well, back to work and crisis mode has resolved. Moved the [place] and is working from home. Closer to both of her children and grandchildren. She is feeling a little more anxious over the past few weeks with the death of her mother -in-law and also the holidays.   Insomnia: taking she is taking Alprazolam, down to half to one pill qhs for sleep, from 1.5 pills. Had one recent panic attack, but able to calm down without taking medication, resolved within 5 minutes.   CAD and Precordial chest pain: she had a cath done in 2010 that showed mild LAD lesion. She was seen in June 2016 and had an episode of substernal chest pain, heavy sensation, that was triggered by stress. She was seen by Dr. Saralyn Pilar and had a negative echo stress test 2017. LAD lesion was stable, she is on Toprol XL, aspirin and Atorvastatin - but not very compliant with statin because of muscle aches, last LDL back in 03/2016 was elevated at 142- we will change to Crestor. No recent episodes of chest pain.    Hyperlipidemia: started on statin in June 2016, having myalgias and stopped taking it, LDL not at goal  Hypothyroidism: always has dry skin, weight has been stable, feeling better, no fatigue.   Patient Active Problem List   Diagnosis Date Noted  . Fever blister 08/17/2015  . Breast cyst 07/06/2015  . GAD (generalized anxiety disorder) 03/05/2015  . CAD in native artery 03/05/2015  . Hyperlipidemia 03/05/2015  . H/O total hysterectomy 11/30/2014  . History of basal cell cancer 11/30/2014  . Insomnia, persistent 11/30/2014  . Calcification of abdominal aorta (HCC) 11/16/2009  . Vitamin D deficiency 03/30/2009  . Major depression, recurrent, chronic ([place]) 05/13/2008  . Osteoporosis 08/20/2007  . Hypothyroidism, adult 04/30/2007  . ADD 04/30/2007    Past Surgical History:  Procedure Laterality Date  . APPENDECTOMY    . BREAST BIOPSY Bilateral    FNA  . BREAST LUMPECTOMY     L breast  . CHOLECYSTECTOMY  1999  . TOTAL ABDOMINAL HYSTERECTOMY W/ BILATERAL SALPINGOOPHORECTOMY  1987    Family History  Problem Relation Age of Onset  . Cancer Mother 12    Breast Cancer  . Breast cancer Mother 44  . Diabetes Father   . Hypertension Father   . Heart attack Maternal Grandmother   . CAD Maternal Grandmother   . CAD Maternal Uncle   .  Stroke Paternal Grandmother   . Cancer Sister     Breast Cancer  . Diabetes Sister   . Depression Sister   . Breast cancer Sister 23  . Allergic rhinitis Son     Social History   Social History  . Marital status: Married    Spouse name: N/A  . Number of children: N/A  . Years of education: N/A   Occupational History  . Not on file.   Social History Main Topics  . Smoking status: Former Smoker    Packs/day: 1.00    Years: 30.00    Types: Cigarettes    Start date: 03/05/1983    Quit date: 06/12/2012  . Smokeless tobacco: Never Used     Comment: 1 ppd for 30 years   . Alcohol use 0.0 oz/week     Comment: Rarely  . Drug use: No   . Sexual activity: Yes    Partners: Male   Other Topics Concern  . Not on file   Social History Narrative   Married, 2 grown children one lives in [place] and one in [place]   She is working remotely now - from home in [place], [place]        Current Outpatient Prescriptions:  .  alendronate (FOSAMAX) 70 MG tablet, Take 1 tablet (70 mg total) by mouth once a week., Disp: 12 tablet, Rfl: 4 .  ALPRAZolam (XANAX) 0.5 MG tablet, Take 1 tablet (0.5 mg total) by mouth 2 (two) times daily as needed. for anxiety, Disp: 40 tablet, Rfl: 0 .  aspirin EC 81 MG tablet, Take 1 tablet (81 mg total) by mouth daily., Disp: 30 tablet, Rfl: 5 .  DULoxetine (CYMBALTA) 60 MG capsule, Daily, Disp: 30 capsule, Rfl: 2 .  metoprolol succinate (TOPROL-XL) 25 MG 24 hr tablet, Take 1 tablet (25 mg total) by mouth daily., Disp: 30 tablet, Rfl: 5 .  nitroGLYCERIN (NITROSTAT) 0.4 MG SL tablet, Place 1 tablet (0.4 mg total) under the tongue every 5 (five) minutes as needed for chest pain., Disp: 20 tablet, Rfl: 2 .  PREMARIN vaginal cream, INSERT 1 APPLICATORFUL VAGINALLY 2 TIMES A WEEK, Disp: 30 g, Rfl: 0 .  rosuvastatin (CRESTOR) 10 MG tablet, Take 1 tablet (10 mg total) by mouth daily., Disp: 30 tablet, Rfl: 2 .  thyroid (ARMOUR THYROID) 30 MG tablet, TAKE 1 TABLET(30 MG) BY MOUTH DAILY, Disp: 30 tablet, Rfl: 5 .  valACYclovir (VALTREX) 1000 MG tablet, , Disp: , Rfl: 3  Allergies  Allergen Reactions  . Aspirin   . Bupropion Hcl     REACTION: depression  . Varenicline Tartrate     REACTION: depression     ROS  Constitutional: Negative for fever or weight change.  Respiratory: Positive  for cough - recovering from a URI, no shortness of breath.   Cardiovascular: Negative for chest pain or palpitations.  Gastrointestinal: Negative for abdominal pain, no bowel changes.  Musculoskeletal: Negative for gait problem or joint swelling.  Skin: Negative for rash.  Neurological: Negative for dizziness or headache.   No other specific complaints in a complete review of systems (except as listed in HPI above).  Objective  Vitals:   06/02/16 0802  BP: 110/64  Pulse: 88  Resp: 16  Temp: 98.7 F (37.1 C)  SpO2: 92%  Weight: 139 lb 2 oz (63.1 kg)  Height: 5' (1.524 m)    Body mass index is 27.17 kg/m.  Physical Exam  Constitutional: Patient appears well-developed and well-nourished. No distress.  HEENT: head atraumatic, normocephalic, pupils equal and reactive to light, neck supple, throat within normal limits Cardiovascular: Normal rate, regular rhythm and normal heart sounds.  No murmur heard. No BLE edema. Pulmonary/Chest: Effort normal and breath sounds normal. No respiratory distress. Abdominal: Soft.  There is no tenderness. Psychiatric: Patient has a normal mood and affect. behavior is normal. Judgment and thought content normal.  Recent Results (from the past 2160 hour(s))  Comprehensive metabolic panel     Status: Abnormal   Collection Time: 03/31/16  8:18 AM  Result Value Ref Range   Glucose 88 65 - 99 mg/dL   BUN 9 6 - 24 mg/dL   Creatinine, Ser 0.85 0.57 - 1.00 mg/dL   GFR calc non Af Amer 76 >59 mL/min/1.73   GFR calc Af Amer 87 >59 mL/min/1.73   BUN/Creatinine Ratio 11 9 - 23   Sodium 141 134 - 144 mmol/L   Potassium 4.7 3.5 - 5.2 mmol/L   Chloride 97 96 - 106 mmol/L   CO2 24 18 - 29 mmol/L   Calcium 9.8 8.7 - 10.2 mg/dL   Total Protein 7.1 6.0 - 8.5 g/dL   Albumin 4.6 3.5 - 5.5 g/dL   Globulin, Total 2.5 1.5 - 4.5 g/dL   Albumin/Globulin Ratio 1.8 1.2 - 2.2   Bilirubin Total 0.3 0.0 - 1.2 mg/dL   Alkaline Phosphatase 132 (H) 39 - 117 IU/L   AST 20 0 - 40 IU/L   ALT 15 0 - 32 IU/L  Lipid panel     Status: Abnormal   Collection Time: 03/31/16  8:18 AM  Result Value Ref Range   Cholesterol, Total 226 (H) 100 - 199 mg/dL   Triglycerides 168 (H) 0 - 149 mg/dL   HDL 50 >39 mg/dL   VLDL Cholesterol Cal 34 5 - 40 mg/dL   LDL Calculated 142 (H) 0 - 99 mg/dL   Chol/HDL  Ratio 4.5 (H) 0.0 - 4.4 ratio units    Comment:                                   T. Chol/HDL Ratio                                             Men  Women                               1/2 Avg.Risk  3.4    3.3                                   Avg.Risk  5.0    4.4                                2X Avg.Risk  9.6    7.1                                3X Avg.Risk 23.4   11.0   TSH     Status: None   Collection Time: 03/31/16  8:18 AM  Result Value Ref  Range   TSH 3.340 0.450 - 4.500 uIU/mL  Vitamin B12     Status: None   Collection Time: 03/31/16  8:18 AM  Result Value Ref Range   Vitamin B-12 279 211 - 946 pg/mL  VITAMIN D 25 Hydroxy (Vit-D Deficiency, Fractures)     Status: None   Collection Time: 03/31/16  8:18 AM  Result Value Ref Range   Vit D, 25-Hydroxy 38.3 30.0 - 100.0 ng/mL    Comment: Vitamin D deficiency has been defined by the [facility] practice guideline as a level of serum 25-OH vitamin D less than 20 ng/mL (1,2). The Endocrine Society went on to further define vitamin D insufficiency as a level between 21 and 29 ng/mL (2). 1. IOM (Institute of Medicine). 2010. Dietary reference    intakes for calcium and D. [place]: The    [employer]. 2. Holick MF, Binkley Hobson, Bischoff-Ferrari HA, et al.    Evaluation, treatment, and prevention of vitamin D    deficiency: an Endocrine Society clinical practice    guideline. JCEM. 2011 Jul; 96(7):1911-30.   CBC with Differential/Platelet     Status: None   Collection Time: 03/31/16  8:18 AM  Result Value Ref Range   WBC 5.6 3.4 - 10.8 x10E3/uL   RBC 4.90 3.77 - 5.28 x10E6/uL   Hemoglobin 13.6 11.1 - 15.9 g/dL   Hematocrit 41.2 34.0 - 46.6 %   MCV 84 79 - 97 fL   MCH 27.8 26.6 - 33.0 pg   MCHC 33.0 31.5 - 35.7 g/dL   RDW 12.9 12.3 - 15.4 %   Platelets 355 150 - 379 x10E3/uL   Neutrophils 63 Not Estab. %   Lymphs 24 Not Estab. %   Monocytes 9 Not Estab. %   Eos 3 Not  Estab. %   Basos 1 Not Estab. %   Neutrophils Absolute 3.5 1.4 - 7.0 x10E3/uL   Lymphocytes Absolute 1.4 0.7 - 3.1 x10E3/uL   Monocytes Absolute 0.5 0.1 - 0.9 x10E3/uL   EOS (ABSOLUTE) 0.2 0.0 - 0.4 x10E3/uL   Basophils Absolute 0.0 0.0 - 0.2 x10E3/uL   Immature Granulocytes 0 Not Estab. %   Immature Grans (Abs) 0.0 0.0 - 0.1 x10E3/uL  Hemoglobin A1c     Status: None   Collection Time: 03/31/16  8:18 AM  Result Value Ref Range   Hgb A1c MFr Bld 5.6 4.8 - 5.6 %    Comment:          Pre-diabetes: 5.7 - 6.4          Diabetes: >6.4          Glycemic control for adults with diabetes: <7.0    Est. average glucose Bld gHb Est-mCnc 114 mg/dL    PHQ2/9: Depression screen [facility] 2/9 06/02/2016 03/07/2016 01/03/2016 11/29/2015 10/28/2015  Decreased Interest 0 0 2 1 3   Down, Depressed, Hopeless 0 0 0 1 3  PHQ - 2 Score 0 0 2 2 6   Altered sleeping - - 2 2 3   Tired, decreased energy - - 1 1 3   Change in appetite - - 2 2 3   Feeling bad or failure about yourself  - - 3 2 3   Trouble concentrating - - 0 1 3  Moving slowly or fidgety/restless - - 0 0 2  Suicidal thoughts - - 0 0 0  PHQ-9 Score - - 10 10 23   Difficult doing work/chores - - Somewhat difficult Somewhat difficult Somewhat difficult  Fall Risk: Fall Risk  06/02/2016 03/07/2016 10/28/2015 08/17/2015 05/19/2015  Falls in the past year? No No No No No    Functional Status Survey: Is the patient deaf or have difficulty hearing?: No Does the patient have difficulty seeing, even when wearing glasses/contacts?: No Does the patient have difficulty concentrating, remembering, or making decisions?: No Does the patient have difficulty walking or climbing stairs?: No Does the patient have difficulty dressing or bathing?: No Does the patient have difficulty doing errands alone such as visiting a doctor's office or shopping?: No    Assessment & Plan  1. Major depression, recurrent, chronic (HCC)  Doing well  - DULoxetine (CYMBALTA) 60 MG  capsule; Daily  Dispense: 30 capsule; Refill: 2  2. GAD (generalized anxiety disorder)  - DULoxetine (CYMBALTA) 60 MG capsule; Daily  Dispense: 30 capsule; Refill: 2 - ALPRAZolam (XANAX) 0.5 MG tablet; Take 1 tablet (0.5 mg total) by mouth 2 (two) times daily as needed. for anxiety  Dispense: 40 tablet; Refill: 0  3. Hypothyroidism, adult  - thyroid (ARMOUR THYROID) 30 MG tablet; TAKE 1 TABLET(30 MG) BY MOUTH DAILY  Dispense: 30 tablet; Refill: 5  4. CAD in native artery  Must resume statin   5. Insomnia, persistent  She will try to wean self off Alprazolam slowly   6. Mixed hyperlipidemia  - rosuvastatin (CRESTOR) 10 MG tablet; Take 1 tablet (10 mg total) by mouth daily.  Dispense: 30 tablet; Refill: 2  7. Calcification of abdominal aorta (HCC)  Must take statin and continue aspirin

## 2016-06-08 ENCOUNTER — Ambulatory Visit
Admission: RE | Admit: 2016-06-08 | Discharge: 2016-06-08 | Disposition: A | Discharge status: Home / Self Care | Payer: BLUE CROSS/BLUE SHIELD | Source: Ambulatory Visit | Attending: Family Medicine | Admitting: Family Medicine

## 2016-06-08 DIAGNOSIS — Z1231 Encounter for screening mammogram for malignant neoplasm of breast: Secondary | ICD-10-CM | POA: Insufficient documentation

## 2016-06-14 ENCOUNTER — Encounter: Payer: Self-pay | Admitting: Family Medicine

## 2016-06-14 ENCOUNTER — Other Ambulatory Visit: Payer: Self-pay | Admitting: Family Medicine

## 2016-06-14 DIAGNOSIS — R05 Cough: Secondary | ICD-10-CM

## 2016-06-14 DIAGNOSIS — R059 Cough, unspecified: Secondary | ICD-10-CM

## 2016-06-14 MED ORDER — FLUTICASONE FUROATE-VILANTEROL 100-25 MCG/INH IN AEPB: 1.0000 | INHALATION_SPRAY | Freq: Every day | RESPIRATORY_TRACT | 0 refills | Status: DC

## 2016-07-03 ENCOUNTER — Other Ambulatory Visit: Payer: Self-pay | Admitting: Family Medicine

## 2016-07-03 DIAGNOSIS — F411 Generalized anxiety disorder: Secondary | ICD-10-CM

## 2016-07-03 NOTE — Telephone Encounter (Signed)
How many pills? Go down to 35 or 38 pills?

## 2016-07-03 NOTE — Telephone Encounter (Signed)
Patient requesting refill of Alprazolam to [facility].  

## 2016-07-04 NOTE — Telephone Encounter (Signed)
Spoke with patient and she agreed 38 pills would be sufficient. Please send to [facility].

## 2016-07-06 ENCOUNTER — Other Ambulatory Visit: Payer: Self-pay | Admitting: Family Medicine

## 2016-07-06 DIAGNOSIS — N952 Postmenopausal atrophic vaginitis: Secondary | ICD-10-CM

## 2016-07-06 NOTE — Telephone Encounter (Signed)
Patient requesting refill of Premarin vaginal cream to [facility].

## 2016-07-13 ENCOUNTER — Other Ambulatory Visit: Payer: BLUE CROSS/BLUE SHIELD

## 2016-07-13 ENCOUNTER — Ambulatory Visit (INDEPENDENT_AMBULATORY_CARE_PROVIDER_SITE_OTHER): Payer: BLUE CROSS/BLUE SHIELD | Admitting: Cardiology

## 2016-07-13 ENCOUNTER — Encounter: Payer: Self-pay | Admitting: Cardiology

## 2016-07-13 VITALS — BP 100/78 | HR 92 | Ht 60.0 in | Wt 140.8 lb

## 2016-07-13 DIAGNOSIS — I2510 Atherosclerotic heart disease of native coronary artery without angina pectoris: Secondary | ICD-10-CM | POA: Diagnosis not present

## 2016-07-13 DIAGNOSIS — R0602 Shortness of breath: Secondary | ICD-10-CM | POA: Diagnosis not present

## 2016-07-13 DIAGNOSIS — R0789 Other chest pain: Secondary | ICD-10-CM

## 2016-07-13 DIAGNOSIS — I10 Essential (primary) hypertension: Secondary | ICD-10-CM

## 2016-07-13 DIAGNOSIS — E782 Mixed hyperlipidemia: Secondary | ICD-10-CM

## 2016-07-13 MED ORDER — OMEPRAZOLE 20 MG PO CPDR: 20.0000 mg | DELAYED_RELEASE_CAPSULE | Freq: Every day | ORAL | 1 refills | Status: DC

## 2016-07-13 NOTE — Progress Notes (Signed)
Cardiology Office Note   Date:  07/13/2016   ID:  TERRYN SENNA, DOB [birth year], MRN [id number]  Referring Doctor:  Loistine Chance, MD   Cardiologist:   Wende Bushy, MD   Reason for consultation:  Chief Complaint  Patient presents with  . other     NP/SELF REFERRAL/SOB, HEAVINESS IN CHEST/ Reviewed meds with pt verbally.      History of Present Illness: Denise Mason is a 61 y.o. female who presents for Chest heaviness, shortness of breath.  Patient reports that she's been having the same symptoms back in 2010. She underwent a workup for this including a heart catheterization. She was told that there was no significant coronary artery disease at that time. She continued to follow-up with her doctors. She remembers the last visit with cardiology was sometime last year. Prior to that she had another workup done for persistence of chest heaviness and shortness of breath. She had a stress echo in 2016 that was essentially negative for ischemia.  She reports that she continues to have similar symptoms,albeit more frequent. She describes the chest heaviness as moderate intensity in the center of the chest, nonradiating, randomly occurring, not exertional in nature. Minutes at a time. She describes a separate symptom that she calls indigestion which is burning in her chest, but not typically completely related to meals.  She also describes persistence of shortness of breath with exertion, moderate in intensity, exertional, goes away with rest. Not severe that was stop her from walking at the gym. She also describes tenderness in fatigue.  Patient denies orthopnea, PND, edema. She does report cramping in her hands and her feet which are not related to walking. No palpitations or loss of consciousness. ROS:  Please see the history of present illness. Aside from mentioned under HPI, all other systems are reviewed and negative.     Past Medical History:  Diagnosis Date  . ADD (attention  deficit disorder with hyperactivity)   . CAD (coronary artery disease)    mild, non-obstructive. by cath 02/16/09 (luminal irreg mid LAD and mid RCA)   . Cancer (HCC)    basil  . Depression   . HLD (hyperlipidemia)   . Hypothyroidism   . Osteopenia     Past Surgical History:  Procedure Laterality Date  . ABDOMINAL HYSTERECTOMY    . APPENDECTOMY    . BREAST BIOPSY Bilateral    FNA- neg  . BREAST LUMPECTOMY     L breast  . CHOLECYSTECTOMY  1999  . TOTAL ABDOMINAL HYSTERECTOMY W/ BILATERAL SALPINGOOPHORECTOMY  1987     reports that she quit smoking about 4 years ago. Her smoking use included Cigarettes. She started smoking about 33 years ago. She has a 30.00 pack-year smoking history. She has never used smokeless tobacco. She reports that she drinks alcohol. She reports that she does not use drugs.   family history includes Allergic rhinitis in her son; Breast cancer (age of onset: 58) in her mother; Breast cancer (age of onset: 37) in her sister; CAD in her maternal grandmother and maternal uncle; Cancer in her sister; Cancer (age of onset: 49) in her mother; Depression in her sister; Diabetes in her father and sister; Heart attack in her maternal grandmother; Hypertension in her father; Stroke in her paternal grandmother.   Outpatient Medications Prior to Visit  Medication Sig Dispense Refill  . alendronate (FOSAMAX) 70 MG tablet Take 1 tablet (70 mg total) by mouth once a week. 12 tablet  4  . ALPRAZolam (XANAX) 0.5 MG tablet TAKE 1 TABLET BY MOUTH TWICE DAILY AS NEEDED FOR ANXIETY 38 tablet 0  . aspirin EC 81 MG tablet Take 1 tablet (81 mg total) by mouth daily. 30 tablet 5  . DULoxetine (CYMBALTA) 60 MG capsule Daily 30 capsule 2  . fluticasone furoate-vilanterol (BREO ELLIPTA) 100-25 MCG/INH AEPB Inhale 1 puff into the lungs daily. 60 each 0  . metoprolol succinate (TOPROL-XL) 25 MG 24 hr tablet Take 1 tablet (25 mg total) by mouth daily. 30 tablet 5  . nitroGLYCERIN (NITROSTAT)  0.4 MG SL tablet Place 1 tablet (0.4 mg total) under the tongue every 5 (five) minutes as needed for chest pain. 20 tablet 2  . PREMARIN vaginal cream INSERT 1 APPLICATORFUL VAGINALLY 2 TIMES A WEEK 30 g 10  . rosuvastatin (CRESTOR) 10 MG tablet Take 1 tablet (10 mg total) by mouth daily. 30 tablet 2  . thyroid (ARMOUR THYROID) 30 MG tablet TAKE 1 TABLET(30 MG) BY MOUTH DAILY 30 tablet 5  . valACYclovir (VALTREX) 1000 MG tablet   3   No facility-administered medications prior to visit.      Allergies: Aspirin; Bupropion hcl; and Varenicline tartrate    PHYSICAL EXAM: VS:  BP 100/78 (BP Location: Left Arm, Patient Position: Sitting, Cuff Size: Normal)   Pulse 92   Ht 5' (1.524 m)   Wt 140 lb 12.8 oz (63.9 kg)   BMI 27.50 kg/m  , Body mass index is 27.5 kg/m. Wt Readings from Last 3 Encounters:  07/13/16 140 lb 12.8 oz (63.9 kg)  06/02/16 139 lb 2 oz (63.1 kg)  03/07/16 138 lb 4.8 oz (62.7 kg)    GENERAL:  well developed, well nourished, obese, not in acute distress HEENT: normocephalic, pink conjunctivae, anicteric sclerae, no xanthelasma, normal dentition, oropharynx clear NECK:  no neck vein engorgement, JVP normal, no hepatojugular reflux, carotid upstroke brisk and symmetric, no bruit, no thyromegaly, no lymphadenopathy LUNGS:  good respiratory effort, clear to auscultation bilaterally CV:  PMI not displaced, no thrills, no lifts, S1 and S2 within normal limits, no palpable S3 or S4, no murmurs, no rubs, no gallops ABD:  Soft, nontender, nondistended, normoactive bowel sounds, no abdominal aortic bruit, no hepatomegaly, no splenomegaly MS: nontender back, no kyphosis, no scoliosis, no joint deformities EXT:  2+ DP/PT pulses, no edema, no varicosities, no cyanosis, no clubbing SKIN: warm, nondiaphoretic, normal turgor, no ulcers NEUROPSYCH: alert, oriented to person, place, and time, sensory/motor grossly intact, normal mood, appropriate affect  Recent Labs: 03/31/2016: ALT 15;  BUN 9; Creatinine, Ser 0.85; Platelets 355; Potassium 4.7; Sodium 141; TSH 3.340   Lipid Panel    Component Value Date/Time   CHOL 226 (H) 03/31/2016 0818   TRIG 168 (H) 03/31/2016 0818   HDL 50 03/31/2016 0818   CHOLHDL 4.5 (H) 03/31/2016 0818   CHOLHDL 6 06/18/2009 1415   VLDL 29.0 06/18/2009 1415   LDLCALC 142 (H) 03/31/2016 0818   LDLDIRECT 170.9 06/18/2009 1415     Other studies Reviewed:  EKG:  The ekg from 07/13/2016 was personally reviewed by me and it revealed sinus rhythm, 92 BPM.  Additional studies/ records that were reviewed personally reviewed by me today include:  Cath report 02/16/2009, per medical records: Luminal irregularity mid LAD and mid RCA Mild nonobstructive coronary artery disease  Stress echo 12/16/2014, [facility]: Normal Stress Echocardiogram NORMAL RIGHT VENTRICULAR SYSTOLIC FUNCTION TRIVIAL REGURGITATION NOTED (See above) NO VALVULAR STENOSIS NOTED   ASSESSMENT AND PLAN: Chest  heaviness or chest pain Shortness of breath She had a heart cath in 2010, per medical records it shows non-obstructive coronary artery disease. Most recent ischemia evaluation was July 2016. Negative stress echocardiogram. The chest pain is not exertional in nature. Would recommend to do a trial with Prilosec 20 mg daily to see if this is related to GERD at all. She also describes a separate symptom of indigestion. For the shortness of breath, would like to evaluate with a pulmonary function test to rule out significant lung disease. We will follow her up closely in the office. If her symptoms persist despite treatment, we will then do a trial with nitrate therapy and possibly proceed with another stress testing in the form of exercise nuclear stress test or pharmacologic nuclear stress test. We will schedule her for an echocardiogram.  Hypertension BP is well controlled. Continue monitoring BP. Continue current medical therapy and lifestyle  changes.  Hyperlipidemia PCP following labs. LDL goal is less than 70 due to atherosclerosis in the aorta and known mild nonobstructive coronary artery disease. Agree with statin therapy.   Current medicines are reviewed at length with the patient today.  The patient does not have concerns regarding medicines.  Labs/ tests ordered today include:  Orders Placed This Encounter  Procedures  . EKG 12-Lead  . ECHOCARDIOGRAM COMPLETE  . Pulmonary function test    I had a lengthy and detailed discussion with the patient regarding diagnoses, prognosis, diagnostic options, treatment options, and side effects of medications.   I counseled the patient on importance of lifestyle modification including heart healthy diet, regular light physical activity for now.   Disposition:   FU with undersigned in 3 weeks  Thank you for this consultation. We will forwarding this consultation to referring physician.   Signed, Wende Bushy, MD  07/13/2016 10:35 AM    [place]  This note was generated in part with voice recognition software and I apologize for any typographical errors that were not detected and corrected.

## 2016-07-13 NOTE — Patient Instructions (Addendum)
Medication Instructions:  Your physician has recommended you make the following change in your medication:  1. START Prilosec 20 mg once daily   Testing/Procedures: Your physician has requested that you have an echocardiogram. Echocardiography is a painless test that uses sound waves to create images of your heart. It provides your doctor with information about the size and shape of your heart and how well your heart's chambers and valves are working. This procedure takes approximately one hour. There are no restrictions for this procedure.  Your physician has recommended that you have a pulmonary function test. Pulmonary Function Tests are a group of tests that measure how well air moves in and out of your lungs.    Follow-Up: Your physician recommends that you schedule a follow-up appointment in: 3 weeks with Dr. Yvone Neu.   It was a pleasure seeing you today here in the office. Please do not hesitate to give [place] a call back if you have any further questions. [place], BSN    Echocardiogram An echocardiogram, or echocardiography, uses sound waves (ultrasound) to produce an image of your heart. The echocardiogram is simple, painless, obtained within a short period of time, and offers valuable information to your health care provider. The images from an echocardiogram can provide information such as:  Evidence of coronary artery disease (CAD).  Heart size.  Heart muscle function.  Heart valve function.  Aneurysm detection.  Evidence of a past heart attack.  Fluid buildup around the heart.  Heart muscle thickening.  Assess heart valve function. Tell a health care provider about:  Any allergies you have.  All medicines you are taking, including vitamins, herbs, eye drops, creams, and over-the-counter medicines.  Any problems you or family members have had with anesthetic medicines.  Any blood disorders you have.  Any surgeries you have had.  Any  medical conditions you have.  Whether you are pregnant or may be pregnant. What happens before the procedure? No special preparation is needed. Eat and drink normally. What happens during the procedure?  In order to produce an image of your heart, gel will be applied to your chest and a wand-like tool (transducer) will be moved over your chest. The gel will help transmit the sound waves from the transducer. The sound waves will harmlessly bounce off your heart to allow the heart images to be captured in real-time motion. These images will then be recorded.  You may need an IV to receive a medicine that improves the quality of the pictures. What happens after the procedure? You may return to your normal schedule including diet, activities, and medicines, unless your health care provider tells you otherwise. This information is not intended to replace advice given to you by your health care provider. Make sure you discuss any questions you have with your health care provider. Document Released: 05/26/2000 Document Revised: 01/15/2016 Document Reviewed: 02/03/2013 Elsevier Interactive Patient Education  2017 [employer] Pulmonary Function Tests Pulmonary function tests (PFTs) measure how well your lungs are working. The tests can help to identify the causes of lung problems. They can also help your health care provider select the best treatment for you. Your health care provider may order pulmonary function for any of the following reasons:  When an illness involving the lungs is suspected.  To follow changes in your lung function over time if you are known to have a chronic lung disease.  For industrial plant workers to examine the effects of being exposed to chemicals  over a long period of time.  To assess lung function prior to surgery or other procedures.  For people who are smokers. Your measured lung function will be compared to the expected lung function of someone with healthy  lungs who is similar to you in age, gender, size, and other factors. This is used to determine your "percent predicted" lung function, which is how your health care provider knows if your lung function is normal or abnormal. If you have had prior pulmonary function testing performed, your health care provider will also compare your current results with past tests to see if your lung function is better, worse, or staying the same. This can sometimes be useful to see if treatments are working.  LET [facility] CARE PROVIDER KNOW ABOUT:  Any allergies you have.  All medicines you are taking, including inhaler or nebulizer medicines, vitamins, herbs, eye drops, creams, and over-the-counter medicines.  Any blood disorders you have.  Previous surgeries you have had, especially recent eye surgery, abdominal surgery, or chest surgery. These can make performing pulmonary function tests difficult or unsafe.  Medical conditions you have.  Chest pain or heart problems.  Tuberculosis or respiratory infections, such as pneumonia, a cold, or the flu. If you think you will have difficulty performing any of the breathing maneuvers, ask your health care provider if you should reschedule the test. RISKS AND COMPLICATIONS: Generally, pulmonary function testing is a safe procedure. However, as with any procedure, complications can occur. Possible complications include:  Lightheadedness due to overbreathing (hyperventilation).  An asthmatic attack from deep breathing. BEFORE THE PROCEDURE  Take medicine as directed by your health care provider. If you take inhaler or nebulizer medicines, ask your health care provider which medicines you should take on the day of your test. Some inhaler medicines may interfere with pulmonary function tests, such as bronchodilator testing, if taken shortly before the test.  Avoid eating a large meal before your test.  Do not smoke before your test.  Wear comfortable clothing  which will not interfere with breathing. PROCEDURE  You will be given a soft nose clip to wear during the procedure. This is done so that all of your breaths will go through your mouth instead of your nose.  You will be given a germ-free (sterile) mouthpiece. It will be attached to a spirometer. The spirometer is the machine that measures your breathing.  You will be instructed to perform various breathing maneuvers. The maneuvers will be done by breathing in (inhaling) and breathing out (exhaling). Depending on what measurements are ordered, you may be asked to repeat the maneuvers several times before the test is completed.  It is important to follow the instructions exactly to obtain accurate results. Make sure to blow as hard and as fast as you can when you are instructed to do so.  You may be given a bronchodilator after testing has been performed. A bronchodilator is a medicine which makes the small air passages in your lungs larger. These medicines usually make it easier to breathe. The tests are then repeated several minutes later after the bronchodilator has taken effect.  You will be monitored carefully during the procedure for faintness, dizziness, difficulty breathing, or any other problems. AFTER THE PROCEDURE   You may resume your usual diet, medicines, and activities as directed by your health care provider.  Your health care provider will go over your test results with you and determine what treatments may be helpful. This information is not intended  to replace advice given to you by your health care provider. Make sure you discuss any questions you have with your health care provider. Document Released: 01/20/2004 Document Revised: 03/19/2013 Document Reviewed: 12/26/2012 Elsevier Interactive Patient Education  2017 [employer].

## 2016-07-13 NOTE — Addendum Note (Signed)
Addended by: Valora Corporal on: 07/13/2016 10:44 AM   Modules accepted: Orders

## 2016-08-07 ENCOUNTER — Other Ambulatory Visit: Payer: Self-pay

## 2016-08-07 ENCOUNTER — Ambulatory Visit (INDEPENDENT_AMBULATORY_CARE_PROVIDER_SITE_OTHER): Payer: BLUE CROSS/BLUE SHIELD

## 2016-08-07 DIAGNOSIS — R0602 Shortness of breath: Secondary | ICD-10-CM

## 2016-08-07 DIAGNOSIS — R0789 Other chest pain: Secondary | ICD-10-CM

## 2016-08-08 ENCOUNTER — Ambulatory Visit: Payer: BLUE CROSS/BLUE SHIELD | Admitting: Cardiology

## 2016-08-15 ENCOUNTER — Ambulatory Visit: Payer: BLUE CROSS/BLUE SHIELD | Admitting: Cardiology

## 2016-08-25 ENCOUNTER — Other Ambulatory Visit: Payer: Self-pay | Admitting: Family Medicine

## 2016-08-25 DIAGNOSIS — F339 Major depressive disorder, recurrent, unspecified: Secondary | ICD-10-CM

## 2016-08-25 DIAGNOSIS — E782 Mixed hyperlipidemia: Secondary | ICD-10-CM

## 2016-08-25 DIAGNOSIS — F411 Generalized anxiety disorder: Secondary | ICD-10-CM

## 2016-08-25 NOTE — Telephone Encounter (Signed)
Patient requesting refill of Duloxetine to [facility].

## 2016-08-25 NOTE — Telephone Encounter (Signed)
Patient requesting refill of Crestor to [facility].

## 2016-08-31 ENCOUNTER — Ambulatory Visit (INDEPENDENT_AMBULATORY_CARE_PROVIDER_SITE_OTHER): Payer: BLUE CROSS/BLUE SHIELD | Admitting: Family Medicine

## 2016-08-31 ENCOUNTER — Encounter: Payer: Self-pay | Admitting: Family Medicine

## 2016-08-31 VITALS — BP 108/70 | HR 93 | Temp 98.2°F | Resp 16 | Ht 60.0 in | Wt 139.0 lb

## 2016-08-31 DIAGNOSIS — R198 Other specified symptoms and signs involving the digestive system and abdomen: Secondary | ICD-10-CM

## 2016-08-31 DIAGNOSIS — E782 Mixed hyperlipidemia: Secondary | ICD-10-CM

## 2016-08-31 DIAGNOSIS — G4700 Insomnia, unspecified: Secondary | ICD-10-CM

## 2016-08-31 DIAGNOSIS — K219 Gastro-esophageal reflux disease without esophagitis: Secondary | ICD-10-CM | POA: Diagnosis not present

## 2016-08-31 DIAGNOSIS — I700 Atherosclerosis of aorta: Secondary | ICD-10-CM

## 2016-08-31 DIAGNOSIS — I2510 Atherosclerotic heart disease of native coronary artery without angina pectoris: Secondary | ICD-10-CM

## 2016-08-31 DIAGNOSIS — Z79899 Other long term (current) drug therapy: Secondary | ICD-10-CM

## 2016-08-31 DIAGNOSIS — F339 Major depressive disorder, recurrent, unspecified: Secondary | ICD-10-CM

## 2016-08-31 DIAGNOSIS — M818 Other osteoporosis without current pathological fracture: Secondary | ICD-10-CM | POA: Diagnosis not present

## 2016-08-31 DIAGNOSIS — F411 Generalized anxiety disorder: Secondary | ICD-10-CM | POA: Diagnosis not present

## 2016-08-31 DIAGNOSIS — E039 Hypothyroidism, unspecified: Secondary | ICD-10-CM

## 2016-08-31 DIAGNOSIS — R194 Change in bowel habit: Secondary | ICD-10-CM

## 2016-08-31 MED ORDER — ALPRAZOLAM 0.5 MG PO TABS: 0.5000 mg | ORAL_TABLET | Freq: Two times a day (BID) | ORAL | 0 refills | Status: DC | PRN

## 2016-08-31 MED ORDER — OMEPRAZOLE 20 MG PO CPDR: 20.0000 mg | DELAYED_RELEASE_CAPSULE | Freq: Every day | ORAL | 0 refills | Status: DC

## 2016-08-31 MED ORDER — DULOXETINE HCL 60 MG PO CPEP: 60.0000 mg | ORAL_CAPSULE | Freq: Every day | ORAL | 1 refills | Status: DC

## 2016-08-31 MED ORDER — IBANDRONATE SODIUM 150 MG PO TABS: 150.0000 mg | ORAL_TABLET | ORAL | 0 refills | Status: DC

## 2016-08-31 MED ORDER — TEMAZEPAM 15 MG PO CAPS: 15.0000 mg | ORAL_CAPSULE | Freq: Every evening | ORAL | 0 refills | Status: DC | PRN

## 2016-08-31 MED ORDER — ROSUVASTATIN CALCIUM 10 MG PO TABS: ORAL_TABLET | ORAL | 1 refills | Status: DC

## 2016-08-31 MED ORDER — THYROID 30 MG PO TABS: ORAL_TABLET | ORAL | 1 refills | Status: DC

## 2016-08-31 NOTE — Progress Notes (Signed)
Name: Denise Mason   MRN: [id number]    DOB: 02-03-1956   Date:08/31/2016       Progress Note  Subjective  Chief Complaint  Chief Complaint  Patient presents with  . Medication Refill    Needs a 90 day supply for insurance purposes  . Coronary Artery Disease    Patient never started Metoprolol and states she does not know about this medication.  . Depression    States her symptoms are better  . Insomnia    Still having trouble with sleep, takes 1 alprazolam at night. Still having trouble falling and staying asleep  . Hyperlipidemia  . Hypothyroidism    Denies any symptoms    HPI  Major Depression and GAD: she has had multiple episodesof depression, has tried and not tolerated or not responded to Prozac, Zoloft and Wellbutrin. She states some of the medication makes her very angry and is afraid to take them. We started her on Duloxetine back in September 2016 , she stated the first week she had nausea and headache, however those symptoms resolved. She was doing well however her adult son was admitted to a detox facility in [place] ( pain medication addiction after second back surgery- back in April 2016) She is now down to 60 mg daily of Cymbalta. Son is doing well, back to work. Moved the [place] and is working from home. Closer to both of her children and grandchildren. She states she still feels anxious at night  Insomnia: taking  Alprazolam, down to half to one pill qhs for sleep, from 1.5 pills. No  recent panic attack, but able to calm down without taking medication. Discussed changing to  Temazepam, since she is able to fall asleep but not stay asleep, and alprazolam prn only for panic   CAD and Precordial chest pain: she had a cath done in 2010 that showed mild LAD lesion. She was seen in June 2016 and had an episode of substernal chest pain, heavy sensation, that was triggered by stress. She was seen by Dr. Saralyn Pilar and had a negative echo stress test 2017. LAD lesion was  stable, she is on Toprol XL, aspirin and Atorvastatin - but not very compliant with statin because of muscle aches, last LDL back in 03/2016 was elevated at 142- she is now on Crestor - since Dec 2017. Current seeing Dr. Yvone Neu and was advised to start Omeprazole  Hyperlipidemia: started on statin in June 2016, having myalgias and stopped taking it, LDL not at goal  Hypothyroidism: always has dry skin, weight has been stable, feeling better, no fatigue.  GERD: started on Omeprazole by Cardiologist, she has not started medication yet, but has changed her diet  Osteoporosis: she has been taking Fosamax but has diarrhea the day that she takes and the day after, no blood in stools or weight loss. She states if she skips medication there is no diarrhea. We will change to [place], monthly and if needed refer to Rheumatologist, if no resolution of diarrhea needs to see GI for further evaluation Patient Active Problem List   Diagnosis Date Noted  . Fever blister 08/17/2015  . Breast cyst 07/06/2015  . GAD (generalized anxiety disorder) 03/05/2015  . CAD in native artery 03/05/2015  . Hyperlipidemia 03/05/2015  . H/O total hysterectomy 11/30/2014  . History of basal cell cancer 11/30/2014  . Insomnia, persistent 11/30/2014  . Calcification of abdominal aorta (HCC) 11/16/2009  . Vitamin D deficiency 03/30/2009  . Major depression, recurrent, chronic ([place])  05/13/2008  . Osteoporosis 08/20/2007  . Hypothyroidism, adult 04/30/2007  . ADD 04/30/2007    Past Surgical History:  Procedure Laterality Date  . ABDOMINAL HYSTERECTOMY    . APPENDECTOMY    . BREAST BIOPSY Bilateral    FNA- neg  . BREAST LUMPECTOMY     L breast  . CHOLECYSTECTOMY  1999  . TOTAL ABDOMINAL HYSTERECTOMY W/ BILATERAL SALPINGOOPHORECTOMY  1987    Family History  Problem Relation Age of Onset  . Cancer Mother 31    Breast Cancer  . Breast cancer Mother 3  . Diabetes Father   . Hypertension Father   . Heart attack  Maternal Grandmother   . CAD Maternal Grandmother   . CAD Maternal Uncle   . Stroke Paternal Grandmother   . Cancer Sister     Breast Cancer  . Diabetes Sister   . Depression Sister   . Breast cancer Sister 60  . Allergic rhinitis Son     Social History   Social History  . Marital status: Married    Spouse name: N/A  . Number of children: N/A  . Years of education: N/A   Occupational History  . Not on file.   Social History Main Topics  . Smoking status: Former Smoker    Packs/day: 1.00    Years: 30.00    Types: Cigarettes    Start date: 03/05/1983    Quit date: 06/12/2012  . Smokeless tobacco: Never Used     Comment: 1 ppd for 30 years   . Alcohol use 0.0 oz/week     Comment: Rarely  . Drug use: No  . Sexual activity: Yes    Partners: Male   Other Topics Concern  . Not on file   Social History Narrative   Married, 2 grown children one lives in [place] and one in [place]   She is working remotely now - from home in [place], [place]        Current Outpatient Prescriptions:  .  ALPRAZolam (XANAX) 0.5 MG tablet, Take 1 tablet (0.5 mg total) by mouth 2 (two) times daily as needed. for anxiety, Disp: 10 tablet, Rfl: 0 .  aspirin EC 81 MG tablet, Take 1 tablet (81 mg total) by mouth daily., Disp: 30 tablet, Rfl: 5 .  DULoxetine (CYMBALTA) 60 MG capsule, Take 1 capsule (60 mg total) by mouth daily., Disp: 90 capsule, Rfl: 1 .  fluticasone (FLONASE) 50 MCG/ACT nasal spray, Place into the nose., Disp: , Rfl:  .  fluticasone furoate-vilanterol (BREO ELLIPTA) 100-25 MCG/INH AEPB, Inhale 1 puff into the lungs daily., Disp: 60 each, Rfl: 0 .  nitroGLYCERIN (NITROSTAT) 0.4 MG SL tablet, Place 1 tablet (0.4 mg total) under the tongue every 5 (five) minutes as needed for chest pain., Disp: 20 tablet, Rfl: 2 .  omeprazole (PRILOSEC) 20 MG capsule, Take 1 capsule (20 mg total) by mouth daily., Disp: 90 capsule, Rfl: 0 .  PREMARIN vaginal cream, INSERT 1 APPLICATORFUL VAGINALLY 2 TIMES  A WEEK, Disp: 30 g, Rfl: 10 .  rosuvastatin (CRESTOR) 10 MG tablet, TAKE 1 TABLET(10 MG) BY MOUTH DAILY, Disp: 90 tablet, Rfl: 1 .  thyroid (ARMOUR THYROID) 30 MG tablet, TAKE 1 TABLET(30 MG) BY MOUTH DAILY, Disp: 90 tablet, Rfl: 1 .  ibandronate (BONIVA) 150 MG tablet, Take 1 tablet (150 mg total) by mouth every 30 (thirty) days. Take in the morning with a full glass of water, on an empty stomach, and do not take anything else by mouth  or lie down for the next 30 min., Disp: 3 tablet, Rfl: 0 .  temazepam (RESTORIL) 15 MG capsule, Take 1 capsule (15 mg total) by mouth at bedtime as needed for sleep., Disp: 30 capsule, Rfl: 0  Allergies  Allergen Reactions  . Aspirin   . Bupropion Hcl     REACTION: depression  . Varenicline Tartrate     REACTION: depression     ROS  Constitutional: Negative for fever or weight change.  Respiratory: Negative for cough and shortness of breath.   Cardiovascular: Negative for chest pain or palpitations.  Gastrointestinal: Negative for abdominal pain, no bowel changes.  Musculoskeletal: Negative for gait problem or joint swelling.  Skin: Negative for rash.  Neurological: Negative for dizziness or headache.  No other specific complaints in a complete review of systems (except as listed in HPI above).  Objective  Vitals:   08/31/16 0856  BP: 108/70  Pulse: 93  Resp: 16  Temp: 98.2 F (36.8 C)  TempSrc: Oral  SpO2: 98%  Weight: 139 lb (63 kg)  Height: 5' (1.524 m)    Body mass index is 27.15 kg/m.  Physical Exam  Constitutional: Patient appears well-developed and well-nourished. Overweight.  No distress.  HEENT: head atraumatic, normocephalic, pupils equal and reactive to light,  neck supple, throat within normal limits Cardiovascular: Normal rate, regular rhythm and normal heart sounds.  No murmur heard. No BLE edema. Pulmonary/Chest: Effort normal and breath sounds normal. No respiratory distress. Abdominal: Soft.  There is no  tenderness. Psychiatric: Patient has a normal mood and affect. behavior is normal. Judgment and thought content normal.  PHQ2/9: Depression screen [facility] 2/9 08/31/2016 06/02/2016 03/07/2016 01/03/2016 11/29/2015  Decreased Interest 0 0 0 2 1  Down, Depressed, Hopeless 0 0 0 0 1  PHQ - 2 Score 0 0 0 2 2  Altered sleeping - - - 2 2  Tired, decreased energy - - - 1 1  Change in appetite - - - 2 2  Feeling bad or failure about yourself  - - - 3 2  Trouble concentrating - - - 0 1  Moving slowly or fidgety/restless - - - 0 0  Suicidal thoughts - - - 0 0  PHQ-9 Score - - - 10 10  Difficult doing work/chores - - - Somewhat difficult Somewhat difficult     Fall Risk: Fall Risk  08/31/2016 06/02/2016 03/07/2016 10/28/2015 08/17/2015  Falls in the past year? No No No No No     Functional Status Survey: Is the patient deaf or have difficulty hearing?: No Does the patient have difficulty seeing, even when wearing glasses/contacts?: No Does the patient have difficulty concentrating, remembering, or making decisions?: No Does the patient have difficulty walking or climbing stairs?: No Does the patient have difficulty dressing or bathing?: No Does the patient have difficulty doing errands alone such as visiting a doctor's office or shopping?: No   Assessment & Plan  1. Major depression, recurrent, chronic (HCC)  - DULoxetine (CYMBALTA) 60 MG capsule; Take 1 capsule (60 mg total) by mouth daily.  Dispense: 90 capsule; Refill: 1  2. GAD (generalized anxiety disorder)  - ALPRAZolam (XANAX) 0.5 MG tablet; Take 1 tablet (0.5 mg total) by mouth 2 (two) times daily as needed. for anxiety  Dispense: 10 tablet; Refill: 0 - DULoxetine (CYMBALTA) 60 MG capsule; Take 1 capsule (60 mg total) by mouth daily.  Dispense: 90 capsule; Refill: 1  3. Hypothyroidism, adult  - thyroid (ARMOUR THYROID) 30 MG tablet;  TAKE 1 TABLET(30 MG) BY MOUTH DAILY  Dispense: 90 tablet; Refill: 1 - TSH  4. Insomnia,  persistent  Taking Alprazolam mostly for sleep, able to fall but not stay asleep, we will try Temazepam, could not tolerate Trazodone - temazepam (RESTORIL) 15 MG capsule; Take 1 capsule (15 mg total) by mouth at bedtime as needed for sleep.  Dispense: 30 capsule; Refill: 0  5. Mixed hyperlipidemia  - rosuvastatin (CRESTOR) 10 MG tablet; TAKE 1 TABLET(10 MG) BY MOUTH DAILY  Dispense: 90 tablet; Refill: 1 - Lipid panel  6. Calcification of abdominal aorta (HCC)  On statin and aspirin 81  daily   7. CAD in native artery  Seen by Cardiologist and advised to Omeprazole, however prescription was not at the pharmacy   8. Other osteoporosis without current pathological fracture  - ibandronate (BONIVA) 150 MG tablet; Take 1 tablet (150 mg total) by mouth every 30 (thirty) days. Take in the morning with a full glass of water, on an empty stomach, and do not take anything else by mouth or lie down for the next 30 min.  Dispense: 3 tablet; Refill: 0  9. Change in bowel movement  Likely from medication ( Fosamax ) but if persists she will call back for referral to GI  10. Gastroesophageal reflux disease without esophagitis  - omeprazole (PRILOSEC) 20 MG capsule; Take 1 capsule (20 mg total) by mouth daily.  Dispense: 90 capsule; Refill: 0  11. Long-term use of high-risk medication  - Comp. Metabolic Panel (12)

## 2016-09-21 ENCOUNTER — Other Ambulatory Visit: Payer: Self-pay | Admitting: Family Medicine

## 2016-09-21 ENCOUNTER — Encounter: Payer: Self-pay | Admitting: Family Medicine

## 2016-09-21 DIAGNOSIS — G4700 Insomnia, unspecified: Secondary | ICD-10-CM

## 2016-09-21 MED ORDER — TEMAZEPAM 15 MG PO CAPS: 15.0000 mg | ORAL_CAPSULE | Freq: Every evening | ORAL | 0 refills | Status: DC | PRN

## 2016-10-02 ENCOUNTER — Encounter: Payer: Self-pay | Admitting: Family Medicine

## 2016-10-02 ENCOUNTER — Other Ambulatory Visit: Payer: Self-pay | Admitting: Family Medicine

## 2016-11-22 LAB — LIPID PANEL
CHOL/HDL RATIO: 4.4 ratio (ref 0.0–4.4)
Cholesterol, Total: 230 mg/dL — ABNORMAL HIGH (ref 100–199)
HDL: 52 mg/dL (ref >39)
LDL Calculated: 150 mg/dL — ABNORMAL HIGH (ref 0–99)
TRIGLYCERIDES: 138 mg/dL (ref 0–149)
VLDL CHOLESTEROL CAL: 28 mg/dL (ref 5–40)

## 2016-11-22 LAB — COMP. METABOLIC PANEL (12)
ALBUMIN: 4.5 g/dL (ref 3.5–5.5)
AST: 21 IU/L (ref 0–40)
Albumin/Globulin Ratio: 2 (ref 1.2–2.2)
Alkaline Phosphatase: 122 IU/L — ABNORMAL HIGH (ref 39–117)
BILIRUBIN TOTAL: 0.3 mg/dL (ref 0.0–1.2)
BUN/Creatinine Ratio: 13 (ref 9–23)
BUN: 11 mg/dL (ref 6–24)
CREATININE: 0.88 mg/dL (ref 0.57–1.00)
Calcium: 9.5 mg/dL (ref 8.7–10.2)
Chloride: 99 mmol/L (ref 96–106)
GFR calc Af Amer: 83 mL/min/{1.73_m2} (ref >59)
GFR calc non Af Amer: 72 mL/min/{1.73_m2} (ref >59)
Globulin, Total: 2.3 g/dL (ref 1.5–4.5)
Glucose: 101 mg/dL — ABNORMAL HIGH (ref 65–99)
POTASSIUM: 4.5 mmol/L (ref 3.5–5.2)
SODIUM: 139 mmol/L (ref 134–144)
Total Protein: 6.8 g/dL (ref 6.0–8.5)

## 2016-11-22 LAB — TSH: TSH: 4.13 u[IU]/mL (ref 0.450–4.500)

## 2016-12-01 ENCOUNTER — Encounter: Payer: Self-pay | Admitting: Family Medicine

## 2016-12-01 ENCOUNTER — Ambulatory Visit (INDEPENDENT_AMBULATORY_CARE_PROVIDER_SITE_OTHER): Payer: BLUE CROSS/BLUE SHIELD | Admitting: Family Medicine

## 2016-12-01 VITALS — BP 112/64 | HR 100 | Temp 97.8°F | Resp 16 | Ht 60.0 in | Wt 134.4 lb

## 2016-12-01 DIAGNOSIS — G4700 Insomnia, unspecified: Secondary | ICD-10-CM | POA: Diagnosis not present

## 2016-12-01 DIAGNOSIS — E039 Hypothyroidism, unspecified: Secondary | ICD-10-CM | POA: Diagnosis not present

## 2016-12-01 DIAGNOSIS — I700 Atherosclerosis of aorta: Secondary | ICD-10-CM

## 2016-12-01 DIAGNOSIS — M818 Other osteoporosis without current pathological fracture: Secondary | ICD-10-CM

## 2016-12-01 DIAGNOSIS — F411 Generalized anxiety disorder: Secondary | ICD-10-CM

## 2016-12-01 DIAGNOSIS — I2510 Atherosclerotic heart disease of native coronary artery without angina pectoris: Secondary | ICD-10-CM | POA: Diagnosis not present

## 2016-12-01 DIAGNOSIS — F339 Major depressive disorder, recurrent, unspecified: Secondary | ICD-10-CM

## 2016-12-01 DIAGNOSIS — E782 Mixed hyperlipidemia: Secondary | ICD-10-CM | POA: Diagnosis not present

## 2016-12-01 MED ORDER — DULOXETINE HCL 60 MG PO CPEP: 60.0000 mg | ORAL_CAPSULE | Freq: Every day | ORAL | 1 refills | Status: DC

## 2016-12-01 MED ORDER — ALPRAZOLAM 0.5 MG PO TABS
0.5000 mg | ORAL_TABLET | Freq: Two times a day (BID) | ORAL | 0 refills | Status: DC | PRN
Start: 2016-12-01 — End: 2017-03-06

## 2016-12-01 MED ORDER — THYROID 30 MG PO TABS: ORAL_TABLET | ORAL | 1 refills | Status: DC

## 2016-12-01 MED ORDER — TEMAZEPAM 22.5 MG PO CAPS: 22.5000 mg | ORAL_CAPSULE | Freq: Every evening | ORAL | 0 refills | Status: DC | PRN

## 2016-12-01 MED ORDER — ROSUVASTATIN CALCIUM 10 MG PO TABS: ORAL_TABLET | ORAL | 1 refills | Status: DC

## 2016-12-01 NOTE — Progress Notes (Signed)
Name: Denise Mason   MRN: [id number]    DOB: 1956/05/29   Date:12/01/2016       Progress Note  Subjective  Chief Complaint  Chief Complaint  Patient presents with  . Medication Refill    3 month F/U  . Depression    Doing well with Cymbalta.  . Insomnia    Waking up periodically throughout the night. Will wake up every 3 hours at night and sometime she will go back to sleep other times she wont.   . Hyperlipidemia  . Gastroesophageal Reflux    States her symptoms are tapering off, feels like it was related to [facility] medication.   . Hypothyroidism    Has Dry Skin, Hair Loss and Constipation off and on    HPI  Major Depression and GAD: she has had multiple episodesof depression, has tried and not tolerated or not responded to Prozac, Zoloft and Wellbutrin. She states some of the medication makes her very angry and is afraid to take them. We started her on Duloxetine back in September 2016 , she stated the first week she had nausea and headache, however those symptoms resolved. She was doing well however her adult son was admitted to a detox facility in [place] ( pain medication addiction after second back surgery- back in April 2016) She is now down to 60 mg daily of Cymbalta. Son is doing well, back to work. Moved the [place] and is working from home. Closer to both of her children and grandchildren. She states she still feels anxious at night, discussed making a list one hour before going to bed.   Insomnia: taking  Alprazolam, down to half pill prn from 1.5 pills. No  recent panic attack, but able to calm down without taking medication. She is on Temazepam for sleep, but wakes up during the night, we will try higher dose   CAD and Precordial chest pain: she had a cath done in 2010 that showed mild LAD lesion. She was seen in June 2016 and had an episode of substernal chest pain, heavy sensation, that was triggered by stress. She was seen by Dr. Saralyn Pilar and had a negative echo stress  test 2017. LAD lesion was stable, she is on Toprol XL, aspirin and Atorvastatin - but not very compliant with statin because of muscle aches, last LDL back in 03/2016 was elevated at 142- she is now on Crestor - since Dec 2017, last LDL still elevated but TSH towards high end of normal, we will wait to adjust dose. Current seeing Dr. Yvone Neu and was advised to start Omeprazole, she has stopped now and not problems with chest pain.   Hyperlipidemia: started on statin in June 2016, having myalgias and stopped taking it, LDL not at goal  Hypothyroidism: always has dry skin, weight has been stable, feeling better, no fatigue. Last TSH towards high end of normal   GERD: started on Omeprazole by Cardiologist, she states off medication, indigestion was caused by diphosphenate, would like something else of osteoporosis.  Osteoporosis: she has been taking Fosamax but has diarrhea the day that she takes and the day after, no blood in stools or weight loss. We changed to [facility] but still has symptoms afterwards and we will refer her to Rheumatologist  Patient Active Problem List   Diagnosis Date Noted  . Fever blister 08/17/2015  . Breast cyst 07/06/2015  . GAD (generalized anxiety disorder) 03/05/2015  . CAD in native artery 03/05/2015  . Hyperlipidemia 03/05/2015  . H/O  total hysterectomy 11/30/2014  . History of basal cell cancer 11/30/2014  . Insomnia, persistent 11/30/2014  . Calcification of abdominal aorta (HCC) 11/16/2009  . Vitamin D deficiency 03/30/2009  . Major depression, recurrent, chronic ([place]) 05/13/2008  . Osteoporosis 08/20/2007  . Hypothyroidism, adult 04/30/2007  . ADD 04/30/2007    Past Surgical History:  Procedure Laterality Date  . ABDOMINAL HYSTERECTOMY    . APPENDECTOMY    . BREAST BIOPSY Bilateral    FNA- neg  . BREAST LUMPECTOMY     L breast  . CHOLECYSTECTOMY  1999  . TOTAL ABDOMINAL HYSTERECTOMY W/ BILATERAL SALPINGOOPHORECTOMY  1987    Family History   Problem Relation Age of Onset  . Cancer Mother 67       Breast Cancer  . Breast cancer Mother 45  . Diabetes Father   . Hypertension Father   . Heart attack Maternal Grandmother   . CAD Maternal Grandmother   . CAD Maternal Uncle   . Stroke Paternal Grandmother   . Cancer Sister        Breast Cancer  . Diabetes Sister   . Depression Sister   . Breast cancer Sister 102  . Allergic rhinitis Son     Social History   Social History  . Marital status: Married    Spouse name: N/A  . Number of children: N/A  . Years of education: N/A   Occupational History  . Not on file.   Social History Main Topics  . Smoking status: Former Smoker    Packs/day: 1.00    Years: 30.00    Types: Cigarettes    Start date: 03/05/1983    Quit date: 06/12/2012  . Smokeless tobacco: Never Used     Comment: 1 ppd for 30 years   . Alcohol use 0.0 oz/week     Comment: Rarely  . Drug use: No  . Sexual activity: Yes    Partners: Male   Other Topics Concern  . Not on file   Social History Narrative   Married, 2 grown children one lives in [place] and one in [place]   She is working remotely now - from home in [place], [place]        Current Outpatient Prescriptions:  .  ALPRAZolam (XANAX) 0.5 MG tablet, Take 1 tablet (0.5 mg total) by mouth 2 (two) times daily as needed. for anxiety, Disp: 10 tablet, Rfl: 0 .  aspirin EC 81 MG tablet, Take 1 tablet (81 mg total) by mouth daily., Disp: 30 tablet, Rfl: 5 .  DULoxetine (CYMBALTA) 60 MG capsule, Take 1 capsule (60 mg total) by mouth daily., Disp: 90 capsule, Rfl: 1 .  fluticasone (FLONASE) 50 MCG/ACT nasal spray, Place 2 sprays into the nose as needed. , Disp: , Rfl:  .  nitroGLYCERIN (NITROSTAT) 0.4 MG SL tablet, Place 1 tablet (0.4 mg total) under the tongue every 5 (five) minutes as needed for chest pain., Disp: 20 tablet, Rfl: 2 .  PREMARIN vaginal cream, INSERT 1 APPLICATORFUL VAGINALLY 2 TIMES A WEEK, Disp: 30 g, Rfl: 10 .  rosuvastatin (CRESTOR)  10 MG tablet, TAKE 1 TABLET(10 MG) BY MOUTH DAILY, Disp: 90 tablet, Rfl: 1 .  temazepam (RESTORIL) 22.5 MG capsule, Take 1 capsule (22.5 mg total) by mouth at bedtime as needed for sleep., Disp: 90 capsule, Rfl: 0 .  thyroid (ARMOUR THYROID) 30 MG tablet, TAKE 1 TABLET(30 MG) BY MOUTH DAILY, extra half on Sundays, Disp: 94 tablet, Rfl: 1  Allergies  Allergen Reactions  .  Aspirin   . Boniva [Ibandronic Acid]     Stomach pain, indigestion, diarrhea  . Bupropion Hcl     REACTION: depression  . Varenicline Tartrate     REACTION: depression     ROS  Constitutional: Negative for fever or weight change.  Respiratory: Negative for cough and shortness of breath.   Cardiovascular: Negative for chest pain or palpitations.  Gastrointestinal: Negative for abdominal pain, no bowel changes.  Musculoskeletal: Negative for gait problem or joint swelling.  Skin: Negative for rash.  Neurological: Negative for dizziness or headache.  No other specific complaints in a complete review of systems (except as listed in HPI above).   Objective  Vitals:   12/01/16 0946  BP: 112/64  Pulse: 100  Resp: 16  Temp: 97.8 F (36.6 C)  TempSrc: Oral  SpO2: 95%  Weight: 134 lb 6.4 oz (61 kg)  Height: 5' (1.524 m)    Body mass index is 26.25 kg/m.  Physical Exam  Constitutional: Patient appears well-developed and well-nourished. Overweight  No distress.  HEENT: head atraumatic, normocephalic, pupils equal and reactive to light, neck supple, throat within normal limits Cardiovascular: Normal rate, regular rhythm and normal heart sounds.  No murmur heard. No BLE edema. Pulmonary/Chest: Effort normal and breath sounds normal. No respiratory distress. Abdominal: Soft.  There is no tenderness. Psychiatric: Patient has a normal mood and affect. behavior is normal. Judgment and thought content normal.  Recent Results (from the past 2160 hour(s))  Lipid panel     Status: Abnormal   Collection Time:  11/21/16  7:37 AM  Result Value Ref Range   Cholesterol, Total 230 (H) 100 - 199 mg/dL   Triglycerides 138 0 - 149 mg/dL   HDL 52 >39 mg/dL   VLDL Cholesterol Cal 28 5 - 40 mg/dL   LDL Calculated 150 (H) 0 - 99 mg/dL   Chol/HDL Ratio 4.4 0.0 - 4.4 ratio    Comment:                                   T. Chol/HDL Ratio                                             Men  Women                               1/2 Avg.Risk  3.4    3.3                                   Avg.Risk  5.0    4.4                                2X Avg.Risk  9.6    7.1                                3X Avg.Risk 23.4   11.0   Comp. Metabolic Panel (12)     Status: Abnormal   Collection Time: 11/21/16  7:37 AM  Result Value Ref Range   Glucose  101 (H) 65 - 99 mg/dL   BUN 11 6 - 24 mg/dL   Creatinine, Ser 0.88 0.57 - 1.00 mg/dL   GFR calc non Af Amer 72 >59 mL/min/1.73   GFR calc Af Amer 83 >59 mL/min/1.73   BUN/Creatinine Ratio 13 9 - 23   Sodium 139 134 - 144 mmol/L   Potassium 4.5 3.5 - 5.2 mmol/L   Chloride 99 96 - 106 mmol/L   Calcium 9.5 8.7 - 10.2 mg/dL   Total Protein 6.8 6.0 - 8.5 g/dL   Albumin 4.5 3.5 - 5.5 g/dL   Globulin, Total 2.3 1.5 - 4.5 g/dL   Albumin/Globulin Ratio 2.0 1.2 - 2.2   Bilirubin Total 0.3 0.0 - 1.2 mg/dL   Alkaline Phosphatase 122 (H) 39 - 117 IU/L   AST 21 0 - 40 IU/L  TSH     Status: None   Collection Time: 11/21/16  7:37 AM  Result Value Ref Range   TSH 4.130 0.450 - 4.500 uIU/mL      PHQ2/9: Depression screen [facility] 2/9 12/01/2016 08/31/2016 06/02/2016 03/07/2016 01/03/2016  Decreased Interest 0 0 0 0 2  Down, Depressed, Hopeless 0 0 0 0 0  PHQ - 2 Score 0 0 0 0 2  Altered sleeping - - - - 2  Tired, decreased energy - - - - 1  Change in appetite - - - - 2  Feeling bad or failure about yourself  - - - - 3  Trouble concentrating - - - - 0  Moving slowly or fidgety/restless - - - - 0  Suicidal thoughts - - - - 0  PHQ-9 Score - - - - 10  Difficult doing work/chores - - - -  Somewhat difficult     Fall Risk: Fall Risk  12/01/2016 08/31/2016 06/02/2016 03/07/2016 10/28/2015  Falls in the past year? No No No No No     Functional Status Survey: Is the patient deaf or have difficulty hearing?: No Does the patient have difficulty seeing, even when wearing glasses/contacts?: No Does the patient have difficulty concentrating, remembering, or making decisions?: No Does the patient have difficulty walking or climbing stairs?: No Does the patient have difficulty dressing or bathing?: No Does the patient have difficulty doing errands alone such as visiting a doctor's office or shopping?: No    Assessment & Plan  1. Major depression, recurrent, chronic (HCC)  - DULoxetine (CYMBALTA) 60 MG capsule; Take 1 capsule (60 mg total) by mouth daily.  Dispense: 90 capsule; Refill: 1  2. GAD (generalized anxiety disorder)  Doing great , taking xanax very seldom  - DULoxetine (CYMBALTA) 60 MG capsule; Take 1 capsule (60 mg total) by mouth daily.  Dispense: 90 capsule; Refill: 1 - ALPRAZolam (XANAX) 0.5 MG tablet; Take 1 tablet (0.5 mg total) by mouth 2 (two) times daily as needed. for anxiety  Dispense: 10 tablet; Refill: 0  3. Hypothyroidism, adult  - thyroid (ARMOUR THYROID) 30 MG tablet; TAKE 1 TABLET(30 MG) BY MOUTH DAILY, extra half on Sundays  Dispense: 94 tablet; Refill: 1 - TSH  4. Insomnia, persistent  - temazepam (RESTORIL) 22.5 MG capsule; Take 1 capsule (22.5 mg total) by mouth at bedtime as needed for sleep.  Dispense: 90 capsule; Refill: 0  5. Mixed hyperlipidemia  - rosuvastatin (CRESTOR) 10 MG tablet; TAKE 1 TABLET(10 MG) BY MOUTH DAILY  Dispense: 90 tablet; Refill: 1  6. Calcification of abdominal aorta (HCC)  Continue aspirin, statin therapy   7. CAD in native  artery  Doing well  8. Other osteoporosis without current pathological fracture  - Ambulatory referral to Rheumatology - PTH, Intact and Calcium

## 2017-02-23 ENCOUNTER — Other Ambulatory Visit: Payer: Self-pay | Admitting: Family Medicine

## 2017-02-23 DIAGNOSIS — G4700 Insomnia, unspecified: Secondary | ICD-10-CM

## 2017-03-06 ENCOUNTER — Encounter: Payer: Self-pay | Admitting: Family Medicine

## 2017-03-06 ENCOUNTER — Ambulatory Visit (INDEPENDENT_AMBULATORY_CARE_PROVIDER_SITE_OTHER): Payer: BLUE CROSS/BLUE SHIELD | Admitting: Family Medicine

## 2017-03-06 VITALS — BP 120/80 | HR 85 | Temp 97.5°F | Resp 16 | Wt 135.9 lb

## 2017-03-06 DIAGNOSIS — I2510 Atherosclerotic heart disease of native coronary artery without angina pectoris: Secondary | ICD-10-CM | POA: Diagnosis not present

## 2017-03-06 DIAGNOSIS — E782 Mixed hyperlipidemia: Secondary | ICD-10-CM | POA: Diagnosis not present

## 2017-03-06 DIAGNOSIS — I700 Atherosclerosis of aorta: Secondary | ICD-10-CM

## 2017-03-06 DIAGNOSIS — E039 Hypothyroidism, unspecified: Secondary | ICD-10-CM

## 2017-03-06 DIAGNOSIS — M818 Other osteoporosis without current pathological fracture: Secondary | ICD-10-CM

## 2017-03-06 DIAGNOSIS — Z23 Encounter for immunization: Secondary | ICD-10-CM

## 2017-03-06 DIAGNOSIS — G4700 Insomnia, unspecified: Secondary | ICD-10-CM

## 2017-03-06 DIAGNOSIS — F411 Generalized anxiety disorder: Secondary | ICD-10-CM

## 2017-03-06 DIAGNOSIS — K219 Gastro-esophageal reflux disease without esophagitis: Secondary | ICD-10-CM | POA: Diagnosis not present

## 2017-03-06 DIAGNOSIS — E538 Deficiency of other specified B group vitamins: Secondary | ICD-10-CM | POA: Diagnosis not present

## 2017-03-06 DIAGNOSIS — F339 Major depressive disorder, recurrent, unspecified: Secondary | ICD-10-CM | POA: Diagnosis not present

## 2017-03-06 LAB — TSH: TSH: 1.88 m[IU]/L (ref 0.40–4.50)

## 2017-03-06 LAB — VITAMIN B12: Vitamin B-12: 437 pg/mL (ref 200–1100)

## 2017-03-06 MED ORDER — ALENDRONATE SODIUM 70 MG PO TABS: 70.0000 mg | ORAL_TABLET | ORAL | 11 refills | Status: DC

## 2017-03-06 MED ORDER — DULOXETINE HCL 60 MG PO CPEP: 60.0000 mg | ORAL_CAPSULE | Freq: Every day | ORAL | 1 refills | Status: DC

## 2017-03-06 MED ORDER — ROSUVASTATIN CALCIUM 10 MG PO TABS: ORAL_TABLET | ORAL | 1 refills | Status: DC

## 2017-03-06 MED ORDER — ALPRAZOLAM 0.5 MG PO TABS: 0.5000 mg | ORAL_TABLET | Freq: Two times a day (BID) | ORAL | 0 refills | Status: DC | PRN

## 2017-03-06 NOTE — Progress Notes (Signed)
Name: Denise Mason   MRN: [id number]    DOB: 1955-10-26   Date:03/06/2017       Progress Note  Subjective  Chief Complaint  Chief Complaint  Patient presents with  . medication review  . Depression    HPI   Major Depression and GAD: she has had multiple episodesof depression, has tried and not tolerated or not responded to Prozac, Zoloft and Wellbutrin. She states some of the medication makes her very angry and is afraid to take them. We started her on Duloxetine back in September 2016 , she stated the first week she had nausea and headache, however those symptoms resolved. She was doing well however her adult son was admitted to a detox facility in [place] ( pain medication addiction after second back surgery- back in April 2016) She is now down to 60 mg daily of Cymbalta. Son is doing well, back to work. Moved the [place] and is working from home. Closer to both of her children and grandchildren. She is feeling better, not as anxious at night, able to sleep with Temazepam, down to alprazolam 10 pills per 3 months.   Insomnia: . She is on Temazepam 22.5 mg and is able to fall and stay asleep, waking up feeling refreshed, only taking Alprazolam very seldom for anxiety during the day  CAD and Precordial chest pain: she had a cath done in 2010 that showed mild LAD lesion. She was seen in June 2016 and had an episode of substernal chest pain, heavy sensation, that was triggered by stress. She was seen by Dr. Saralyn Pilar and had a negative echo stress test 2017. LAD lesion was stable, she is on Toprol XL, aspirin and Atorvastatin - but not very compliant with statin because of muscle aches, last LDL back in 03/2016 was elevated at 142- she is now on Crestor - since Dec 2017, recheck labs  Hyperlipidemia: started on statin in June 2016, having myalgias and stopped taking it, LDL has gone up since and she is now on Crestor, and tolerating it well at this time  Hypothyroidism: always has dry skin,  weight has been stable, , no fatigue. Last TSH towards high end of normal, we will recheck today   GERD: off medication and is doing well.   Osteoporosis: she has been taking Fosamax but has diarrhea the day that she takes and the day after, no blood in stools or weight loss. We changed to [facility] but still has symptoms afterwards  Also, we referred to Endo but she they were playing phone tags, she wants to try Fosamax again and if needed got to [facility]  Patient Active Problem List   Diagnosis Date Noted  . Fever blister 08/17/2015  . Breast cyst 07/06/2015  . GAD (generalized anxiety disorder) 03/05/2015  . CAD in native artery 03/05/2015  . Hyperlipidemia 03/05/2015  . H/O total hysterectomy 11/30/2014  . History of basal cell cancer 11/30/2014  . Insomnia, persistent 11/30/2014  . Calcification of abdominal aorta (HCC) 11/16/2009  . Vitamin D deficiency 03/30/2009  . Major depression, recurrent, chronic ([place]) 05/13/2008  . Osteoporosis 08/20/2007  . Hypothyroidism, adult 04/30/2007  . ADD 04/30/2007    Past Surgical History:  Procedure Laterality Date  . ABDOMINAL HYSTERECTOMY    . APPENDECTOMY    . BREAST BIOPSY Bilateral    FNA- neg  . BREAST LUMPECTOMY     L breast  . CHOLECYSTECTOMY  1999  . TOTAL ABDOMINAL HYSTERECTOMY W/ BILATERAL SALPINGOOPHORECTOMY  1987  Family History  Problem Relation Age of Onset  . Cancer Mother 13       Breast Cancer  . Breast cancer Mother 73  . Diabetes Father   . Hypertension Father   . Heart attack Maternal Grandmother   . CAD Maternal Grandmother   . CAD Maternal Uncle   . Stroke Paternal Grandmother   . Cancer Sister        Breast Cancer  . Diabetes Sister   . Depression Sister   . Breast cancer Sister 19  . Allergic rhinitis Son     Social History   Social History  . Marital status: Married    Spouse name: N/A  . Number of children: N/A  . Years of education: N/A   Occupational History  . Not on file.   Social  History Main Topics  . Smoking status: Former Smoker    Packs/day: 1.00    Years: 30.00    Types: Cigarettes    Start date: 03/05/1983    Quit date: 06/12/2012  . Smokeless tobacco: Never Used     Comment: 1 ppd for 30 years   . Alcohol use 0.0 oz/week     Comment: Rarely  . Drug use: No  . Sexual activity: Yes    Partners: Male   Other Topics Concern  . Not on file   Social History Narrative   Married, 2 grown children one lives in [place] and one in [place]   She is working remotely now - from home in [place], [place]        Current Outpatient Prescriptions:  .  ALPRAZolam (XANAX) 0.5 MG tablet, Take 1 tablet (0.5 mg total) by mouth 2 (two) times daily as needed. for anxiety, Disp: 10 tablet, Rfl: 0 .  aspirin EC 81 MG tablet, Take 1 tablet (81 mg total) by mouth daily., Disp: 30 tablet, Rfl: 5 .  brompheniramine-pseudoephedrine-DM 30-2-10 MG/5ML syrup, Take by mouth every 6 (six) hours as needed., Disp: , Rfl:  .  doxycycline (VIBRAMYCIN) 100 MG capsule, , Disp: , Rfl: 0 .  DULoxetine (CYMBALTA) 60 MG capsule, Take 1 capsule (60 mg total) by mouth daily., Disp: 90 capsule, Rfl: 1 .  fluticasone (FLONASE) 50 MCG/ACT nasal spray, Place 2 sprays into the nose as needed. , Disp: , Rfl:  .  nitroGLYCERIN (NITROSTAT) 0.4 MG SL tablet, Place 1 tablet (0.4 mg total) under the tongue every 5 (five) minutes as needed for chest pain., Disp: 20 tablet, Rfl: 2 .  PREMARIN vaginal cream, INSERT 1 APPLICATORFUL VAGINALLY 2 TIMES A WEEK, Disp: 30 g, Rfl: 10 .  rosuvastatin (CRESTOR) 10 MG tablet, TAKE 1 TABLET(10 MG) BY MOUTH DAILY, Disp: 90 tablet, Rfl: 1 .  temazepam (RESTORIL) 22.5 MG capsule, TAKE 1 CAPSULE BY MOUTH EVERY DAY AT BEDTIME AS NEEDED FOR SLEEP, Disp: 90 capsule, Rfl: 0 .  thyroid (ARMOUR THYROID) 30 MG tablet, TAKE 1 TABLET(30 MG) BY MOUTH DAILY, extra half on Sundays, Disp: 94 tablet, Rfl: 1 .  alendronate (FOSAMAX) 70 MG tablet, Take 1 tablet (70 mg total) by mouth every 7 (seven)  days. Take with a full glass of water on an empty stomach., Disp: 4 tablet, Rfl: 11  Allergies  Allergen Reactions  . Aspirin   . Boniva [Ibandronic Acid]     Stomach pain, indigestion, diarrhea  . Bupropion Hcl     REACTION: depression  . Varenicline Tartrate     REACTION: depression     ROS  Constitutional: Negative  for fever or weight change.  Respiratory: Positive  For mild  cough no shortness of breath.  ( recovering from URI)  Cardiovascular: Negative for chest pain or palpitations.  Gastrointestinal: Negative for abdominal pain, no bowel changes.  Musculoskeletal: Negative for gait problem or joint swelling.  Skin: Negative for rash.  Neurological: Negative for dizziness or headache.  No other specific complaints in a complete review of systems (except as listed in HPI above).  Objective  Vitals:   03/06/17 0905  BP: 120/80  Pulse: 85  Resp: 16  Temp: (!) 97.5 F (36.4 C)  TempSrc: Oral  SpO2: 95%  Weight: 135 lb 14.4 oz (61.6 kg)    Body mass index is 26.54 kg/m.  Physical Exam  Constitutional: Patient appears well-developed and well-nourished.  No distress.  HEENT: head atraumatic, normocephalic, pupils equal and reactive to light, neck supple, throat within normal limits Cardiovascular: Normal rate, regular rhythm and normal heart sounds.  No murmur heard. No BLE edema. Pulmonary/Chest: Effort normal and breath sounds normal. No respiratory distress. Abdominal: Soft.  There is no tenderness. Psychiatric: Patient has a normal mood and affect. behavior is normal. Judgment and thought content normal.  PHQ2/9: Depression screen [facility] 2/9 03/06/2017 12/01/2016 08/31/2016 06/02/2016 03/07/2016  Decreased Interest 0 0 0 0 0  Down, Depressed, Hopeless 0 0 0 0 0  PHQ - 2 Score 0 0 0 0 0  Altered sleeping - - - - -  Tired, decreased energy - - - - -  Change in appetite - - - - -  Feeling bad or failure about yourself  - - - - -  Trouble concentrating - - - - -   Moving slowly or fidgety/restless - - - - -  Suicidal thoughts - - - - -  PHQ-9 Score - - - - -  Difficult doing work/chores - - - - -     Fall Risk: Fall Risk  03/06/2017 12/01/2016 08/31/2016 06/02/2016 03/07/2016  Falls in the past year? No No No No No    Assessment & Plan  1. Major depression, recurrent, chronic (HCC)  - DULoxetine (CYMBALTA) 60 MG capsule; Take 1 capsule (60 mg total) by mouth daily.  Dispense: 90 capsule; Refill: 1  2. Flu vaccine need  - Flu Vaccine QUAD 36+ mos IM  3. GAD (generalized anxiety disorder)  - ALPRAZolam (XANAX) 0.5 MG tablet; Take 1 tablet (0.5 mg total) by mouth 2 (two) times daily as needed. for anxiety  Dispense: 10 tablet; Refill: 0 - DULoxetine (CYMBALTA) 60 MG capsule; Take 1 capsule (60 mg total) by mouth daily.  Dispense: 90 capsule; Refill: 1  4. Hypothyroidism, adult  - TSH  5. Insomnia, persistent  Doing well on Temazepam   6. Mixed hyperlipidemia  - rosuvastatin (CRESTOR) 10 MG tablet; TAKE 1 TABLET(10 MG) BY MOUTH DAILY  Dispense: 90 tablet; Refill: 1  7. Calcification of abdominal aorta (HCC)  On statin therapy, aspirin, and we will recheck fasting labs next visit  8. CAD in native artery   9. Other osteoporosis without current pathological fracture  - Parathyroid hormone, intact (no Ca) - alendronate (FOSAMAX) 70 MG tablet; Take 1 tablet (70 mg total) by mouth every 7 (seven) days. Take with a full glass of water on an empty stomach.  Dispense: 4 tablet; Refill: 11  10. Gastroesophageal reflux disease without esophagitis  Doing well at this time  11. B12 deficiency  - Vitamin B12

## 2017-03-07 LAB — PARATHYROID HORMONE, INTACT (NO CA): PTH: 23 pg/mL (ref 14–64)

## 2017-03-29 ENCOUNTER — Encounter: Payer: Self-pay | Admitting: Family Medicine

## 2017-04-04 ENCOUNTER — Ambulatory Visit: Payer: BLUE CROSS/BLUE SHIELD | Admitting: Family Medicine

## 2017-04-25 ENCOUNTER — Other Ambulatory Visit: Payer: Self-pay | Admitting: Family Medicine

## 2017-04-25 DIAGNOSIS — Z1231 Encounter for screening mammogram for malignant neoplasm of breast: Secondary | ICD-10-CM

## 2017-05-16 ENCOUNTER — Other Ambulatory Visit: Payer: Self-pay | Admitting: Family Medicine

## 2017-05-16 DIAGNOSIS — E039 Hypothyroidism, unspecified: Secondary | ICD-10-CM

## 2017-05-16 DIAGNOSIS — F411 Generalized anxiety disorder: Secondary | ICD-10-CM

## 2017-05-16 NOTE — Telephone Encounter (Signed)
Refill request for thyroid medication: [place] Thyroid 30 mg  Last Physical: 12/03/2014  Lab Results  Component Value Date   TSH 1.88 03/06/2017    Follow up visit:  06/14/2017

## 2017-05-18 ENCOUNTER — Other Ambulatory Visit: Payer: Self-pay | Admitting: Family Medicine

## 2017-05-18 DIAGNOSIS — G4700 Insomnia, unspecified: Secondary | ICD-10-CM

## 2017-05-18 DIAGNOSIS — F411 Generalized anxiety disorder: Secondary | ICD-10-CM

## 2017-05-18 NOTE — Telephone Encounter (Signed)
Controlled medications, she needs to be seen before she needs refills. Not sure if I can see her prior to her visit. We will need to call pharmacy and only fill Temazepam until her next visit, Only 2 pills of alprazolam to last until follow up

## 2017-05-18 NOTE — Telephone Encounter (Signed)
Refill request for general medication: temazepam and alprazolam to [facility].   Last office visit: 03/06/2017   Next visit: 06/14/2017

## 2017-05-29 ENCOUNTER — Encounter: Payer: Self-pay | Admitting: Family Medicine

## 2017-06-11 ENCOUNTER — Ambulatory Visit
Admission: RE | Admit: 2017-06-11 | Discharge: 2017-06-11 | Disposition: A | Discharge status: Home / Self Care | Payer: BLUE CROSS/BLUE SHIELD | Source: Ambulatory Visit | Attending: Family Medicine | Admitting: Family Medicine

## 2017-06-11 DIAGNOSIS — Z1231 Encounter for screening mammogram for malignant neoplasm of breast: Secondary | ICD-10-CM | POA: Insufficient documentation

## 2017-06-14 ENCOUNTER — Ambulatory Visit: Payer: BLUE CROSS/BLUE SHIELD | Admitting: Family Medicine

## 2017-06-14 DIAGNOSIS — J019 Acute sinusitis, unspecified: Secondary | ICD-10-CM | POA: Diagnosis not present

## 2017-06-28 ENCOUNTER — Other Ambulatory Visit: Payer: Self-pay | Admitting: Family Medicine

## 2017-06-28 DIAGNOSIS — G4700 Insomnia, unspecified: Secondary | ICD-10-CM

## 2017-06-28 NOTE — Telephone Encounter (Signed)
Last refill: 05/23/2017 30 capsules, 0 refills.   Pharmacy:  [facility] [place], [place]

## 2017-06-29 MED ORDER — TEMAZEPAM 22.5 MG PO CAPS: 22.5000 mg | ORAL_CAPSULE | Freq: Every evening | ORAL | 0 refills | Status: DC | PRN

## 2017-07-02 ENCOUNTER — Encounter: Payer: Self-pay | Admitting: Family Medicine

## 2017-07-02 ENCOUNTER — Ambulatory Visit: Payer: BLUE CROSS/BLUE SHIELD | Admitting: Family Medicine

## 2017-07-02 VITALS — BP 112/64 | HR 97 | Temp 98.0°F | Resp 16 | Ht 60.0 in | Wt 140.4 lb

## 2017-07-02 DIAGNOSIS — R0683 Snoring: Secondary | ICD-10-CM | POA: Diagnosis not present

## 2017-07-02 DIAGNOSIS — E039 Hypothyroidism, unspecified: Secondary | ICD-10-CM

## 2017-07-02 DIAGNOSIS — F339 Major depressive disorder, recurrent, unspecified: Secondary | ICD-10-CM

## 2017-07-02 DIAGNOSIS — E782 Mixed hyperlipidemia: Secondary | ICD-10-CM

## 2017-07-02 DIAGNOSIS — I2510 Atherosclerotic heart disease of native coronary artery without angina pectoris: Secondary | ICD-10-CM | POA: Diagnosis not present

## 2017-07-02 DIAGNOSIS — I700 Atherosclerosis of aorta: Secondary | ICD-10-CM | POA: Diagnosis not present

## 2017-07-02 DIAGNOSIS — F411 Generalized anxiety disorder: Secondary | ICD-10-CM

## 2017-07-02 DIAGNOSIS — E538 Deficiency of other specified B group vitamins: Secondary | ICD-10-CM | POA: Diagnosis not present

## 2017-07-02 DIAGNOSIS — G4700 Insomnia, unspecified: Secondary | ICD-10-CM | POA: Diagnosis not present

## 2017-07-02 MED ORDER — ALPRAZOLAM 0.5 MG PO TABS: 0.5000 mg | ORAL_TABLET | Freq: Two times a day (BID) | ORAL | 0 refills | Status: DC | PRN

## 2017-07-02 MED ORDER — QUETIAPINE FUMARATE 25 MG PO TABS: 25.0000 mg | ORAL_TABLET | Freq: Every day | ORAL | 2 refills | Status: DC

## 2017-07-02 NOTE — Patient Instructions (Signed)
Evolocumab injection What is this medicine? EVOLOCUMAB (e voe LOK ue mab) is known as a PCSK9 inhibitor. It is used to lower the level of cholesterol in the blood. It may be used alone or in combination with other cholesterol-lowering drugs. This drug may also be used to reduce the risk of heart attack, stroke, and certain types of heart surgery in patients with heart disease. This medicine may be used for other purposes; ask your health care provider or pharmacist if you have questions. COMMON BRAND NAME(S): REPATHA What should I tell my health care provider before I take this medicine? They need to know if you have any of these conditions: -an unusual or allergic reaction to evolocumab, other medicines, foods, dyes, or preservatives -pregnant or trying to get pregnant -breast-feeding How should I use this medicine? This medicine is for injection under the skin. You will be taught how to prepare and give this medicine. Use exactly as directed. Take your medicine at regular intervals. Do not take your medicine more often than directed. It is important that you put your used needles and syringes in a special sharps container. Do not put them in a trash can. If you do not have a sharps container, call your pharmacist or health care provider to get one. Talk to your pediatrician regarding the use of this medicine in children. While this drug may be prescribed for children as young as 13 years for selected conditions, precautions do apply. Overdosage: If you think you have taken too much of this medicine contact a poison control center or emergency room at once. NOTE: This medicine is only for you. Do not share this medicine with others. What if I miss a dose? If you miss a dose, take it as soon as you can if there are more than 7 days until the next scheduled dose, or skip the missed dose and take the next dose according to your original schedule. Do not take double or extra doses. What may interact  with this medicine? Interactions are not expected. This list may not describe all possible interactions. Give your health care provider a list of all the medicines, herbs, non-prescription drugs, or dietary supplements you use. Also tell them if you smoke, drink alcohol, or use illegal drugs. Some items may interact with your medicine. What should I watch for while using this medicine? You may need blood work while you are taking this medicine. What side effects may I notice from receiving this medicine? Side effects that you should report to your doctor or health care professional as soon as possible: -allergic reactions like skin rash, itching or hives, swelling of the face, lips, or tongue -signs and symptoms of infection like fever or chills; cough; sore throat; pain or trouble passing urine Side effects that usually do not require medical attention (report to your doctor or health care professional if they continue or are bothersome): -diarrhea -nausea -muscle pain -pain, redness, or irritation at site where injected This list may not describe all possible side effects. Call your doctor for medical advice about side effects. You may report side effects to FDA at 1-800-FDA-1088. Where should I keep my medicine? Keep out of the reach of children. You will be instructed on how to store this medicine. Throw away any unused medicine after the expiration date on the label. NOTE: This sheet is a summary. It may not cover all possible information. If you have questions about this medicine, talk to your doctor, pharmacist, or health care   provider.  2018 Elsevier/Gold Standard (2016-05-15 13:21:53)  

## 2017-07-02 NOTE — Progress Notes (Signed)
Name: Denise Mason   MRN: [id number]    DOB: 1956/01/02   Date:07/02/2017       Progress Note  Subjective  Chief Complaint  Chief Complaint  Patient presents with  . Medication Refill    3 month F/U  . Depression  . Insomnia  . Hyperlipidemia  . Hypothyroidism  . Osteoporosis    HPI  Major Depression and GAD: she has had multiple episodesof depression, has tried and not tolerated or not responded to Prozac, Zoloft and Wellbutrin. She states some of the medication makes her very angry and is afraid to take them. We started her on Duloxetine back in September 2016 , she stated the first week she had nausea and headache, however those symptoms resolved. She was doing well however her adult son was admitted to a detox facility in [place] ( pain medication addiction after second back surgery- back in April 2016) She is now down to 60 mg daily of Cymbalta. Son is doing well, back to work. Moved the [facility] and is working from home, but travelling more often, getting stressed again. Anxiety has gone up and is struggling with sleep and also worrying all the time again. Still only taking alprazolam prn. Still better than in the past  Insomnia: . She was on Temazepam 22.5 mg and initially it worked for her, however not waking up refreshed anymore, and granddaughter stated that she snores, also husband has noticed snoring is louder and more frequent. ESS is high and we will order a sleep study.   CAD and Precordial chest pain: she had a cath done in 2010 that showed mild LAD lesion. She was seen in June 2016 and had an episode of substernal chest pain, heavy sensation, that was triggered by stress. She was seen by Dr. Saralyn Pilar and had a negative echo stress test 2017. LAD lesion was stable, she is on Toprol XL, aspirin and Atorvastatin - but not very compliant with statin because of muscle aches, last LDL back in 03/2016 was elevated at 142- she is now on Crestor - since Dec 2017, LDL still above  goal at 15, discussed new class of mediation PKS9  Hyperlipidemia: started on statin in June 2016, having myalgias and stopped taking it, LDL has gone up since and she is now on Crestor, she is not fasting today, we will recheck next visit   Hypothyroidism: always has dry skin, weight has been stable, , no fatigue. Last TSH towards high end of normal, we will recheck next time.   GERD: off medication and is doing well.   Osteoporosis: she has been taking Fosamax but has diarrhea the day that she takes and the day after, no blood in stools or weight loss. We changed to [facility] but still has symptoms afterwards  Also, we referred to Endo but she they were playing phone tags, she is back on Fosamax for now, also taking vitamin D supplementation   Patient Active Problem List   Diagnosis Date Noted  . Fever blister 08/17/2015  . Breast cyst 07/06/2015  . GAD (generalized anxiety disorder) 03/05/2015  . CAD in native artery 03/05/2015  . Hyperlipidemia 03/05/2015  . H/O total hysterectomy 11/30/2014  . History of basal cell cancer 11/30/2014  . Insomnia, persistent 11/30/2014  . Calcification of abdominal aorta (HCC) 11/16/2009  . Vitamin D deficiency 03/30/2009  . Major depression, recurrent, chronic ([place]) 05/13/2008  . Osteoporosis 08/20/2007  . Hypothyroidism, adult 04/30/2007  . ADD 04/30/2007    Past Surgical  History:  Procedure Laterality Date  . ABDOMINAL HYSTERECTOMY    . APPENDECTOMY    . BREAST BIOPSY Bilateral    FNA- neg  . BREAST EXCISIONAL BIOPSY Left    benign  . CHOLECYSTECTOMY  1999  . TOTAL ABDOMINAL HYSTERECTOMY W/ BILATERAL SALPINGOOPHORECTOMY  1987    Family History  Problem Relation Age of Onset  . Cancer Mother 17       Breast Cancer  . Breast cancer Mother 66  . Diabetes Father   . Hypertension Father   . Heart attack Maternal Grandmother   . CAD Maternal Grandmother   . CAD Maternal Uncle   . Stroke Paternal Grandmother   . Cancer Sister         Breast Cancer  . Diabetes Sister   . Depression Sister   . Breast cancer Sister 70  . Allergic rhinitis Son     Social History   Socioeconomic History  . Marital status: Married    Spouse name: Not on file  . Number of children: Not on file  . Years of education: Not on file  . Highest education level: Not on file  Social Needs  . Financial resource strain: Not on file  . Food insecurity - worry: Not on file  . Food insecurity - inability: Not on file  . Transportation needs - medical: Not on file  . Transportation needs - non-medical: Not on file  Occupational History  . Not on file  Tobacco Use  . Smoking status: Former Smoker    Packs/day: 1.00    Years: 30.00    Pack years: 30.00    Types: Cigarettes    Start date: 03/05/1983    Last attempt to quit: 06/12/2012    Years since quitting: 5.0  . Smokeless tobacco: Never Used  . Tobacco comment: 1 ppd for 30 years   Substance and Sexual Activity  . Alcohol use: Yes    Alcohol/week: 0.0 oz    Comment: Rarely  . Drug use: No  . Sexual activity: Yes    Partners: Male  Other Topics Concern  . Not on file  Social History Narrative   Married, 2 grown children one lives in [place] and one in [place]   She is working remotely now - from home in [place], [place]     Current Outpatient Medications:  .  alendronate (FOSAMAX) 70 MG tablet, Take 1 tablet (70 mg total) by mouth every 7 (seven) days. Take with a full glass of water on an empty stomach., Disp: 4 tablet, Rfl: 11 .  aspirin EC 81 MG tablet, Take 1 tablet (81 mg total) by mouth daily., Disp: 30 tablet, Rfl: 5 .  DULoxetine (CYMBALTA) 60 MG capsule, Take 1 capsule (60 mg total) by mouth daily., Disp: 90 capsule, Rfl: 1 .  fluticasone (FLONASE) 50 MCG/ACT nasal spray, Place 2 sprays into the nose as needed. , Disp: , Rfl:  .  nitroGLYCERIN (NITROSTAT) 0.4 MG SL tablet, Place 1 tablet (0.4 mg total) under the tongue every 5 (five) minutes as needed for chest pain., Disp: 20  tablet, Rfl: 2 .  PREMARIN vaginal cream, INSERT 1 APPLICATORFUL VAGINALLY 2 TIMES A WEEK, Disp: 30 g, Rfl: 10 .  rosuvastatin (CRESTOR) 10 MG tablet, TAKE 1 TABLET(10 MG) BY MOUTH DAILY, Disp: 90 tablet, Rfl: 1 .  thyroid (ARMOUR THYROID) 30 MG tablet, TAKE 1 TABLET BY MOUTH EVERY DAY EXCEPT ON SUNDAY TAKE 1 1/2 TABLETS, Disp: 94 tablet, Rfl: 1 .  ALPRAZolam (XANAX) 0.5 MG tablet, Take 1 tablet (0.5 mg total) by mouth 2 (two) times daily as needed. for anxiety, Disp: 10 tablet, Rfl: 0 .  brompheniramine-pseudoephedrine-DM 30-2-10 MG/5ML syrup, Take by mouth every 6 (six) hours as needed., Disp: , Rfl:  .  QUEtiapine (SEROQUEL) 25 MG tablet, Take 1 tablet (25 mg total) by mouth at bedtime. For sleep, Disp: 30 tablet, Rfl: 2  Allergies  Allergen Reactions  . Aspirin   . Boniva [Ibandronic Acid]     Stomach pain, indigestion, diarrhea  . Bupropion Hcl     REACTION: depression  . Varenicline Tartrate     REACTION: depression     ROS  Constitutional: Negative for fever or significant weight change.  Respiratory: Negative for cough and shortness of breath.   Cardiovascular: Negative for chest pain or palpitations.  Gastrointestinal: Negative for abdominal pain, no bowel changes.  Musculoskeletal: Negative for gait problem or joint swelling.  Skin: Negative for rash.  Neurological: Negative for dizziness or headache.  No other specific complaints in a complete review of systems (except as listed in HPI above).  Objective  Vitals:   07/02/17 1205  BP: 112/64  Pulse: 97  Resp: 16  Temp: 98 F (36.7 C)  TempSrc: Oral  SpO2: 97%  Weight: 140 lb 6.4 oz (63.7 kg)  Height: 5' (1.524 m)    Body mass index is 27.42 kg/m.  Physical Exam  Constitutional: Patient appears well-developed and well-nourished. No distress.  HEENT: head atraumatic, normocephalic, pupils equal and reactive to light,  neck supple, throat within normal limits Cardiovascular: Normal rate, regular rhythm and  normal heart sounds.  No murmur heard. No BLE edema. Pulmonary/Chest: Effort normal and breath sounds normal. No respiratory distress. Abdominal: Soft.  There is no tenderness. Psychiatric: Patient has a normal mood and affect. behavior is normal. Judgment and thought content normal.  PHQ2/9: Depression screen [facility] 2/9 07/02/2017 03/06/2017 12/01/2016 08/31/2016 06/02/2016  Decreased Interest 1 0 0 0 0  Down, Depressed, Hopeless 0 0 0 0 0  PHQ - 2 Score 1 0 0 0 0  Altered sleeping - - - - -  Tired, decreased energy - - - - -  Change in appetite - - - - -  Feeling bad or failure about yourself  - - - - -  Trouble concentrating - - - - -  Moving slowly or fidgety/restless - - - - -  Suicidal thoughts - - - - -  PHQ-9 Score - - - - -  Difficult doing work/chores - - - - -     Fall Risk: Fall Risk  07/02/2017 03/06/2017 12/01/2016 08/31/2016 06/02/2016  Falls in the past year? No No No No No     Functional Status Survey: Is the patient deaf or have difficulty hearing?: No Does the patient have difficulty seeing, even when wearing glasses/contacts?: No Does the patient have difficulty concentrating, remembering, or making decisions?: No Does the patient have difficulty walking or climbing stairs?: No Does the patient have difficulty dressing or bathing?: No Does the patient have difficulty doing errands alone such as visiting a doctor's office or shopping?: No   GAD 7 : Generalized Anxiety Score 07/02/2017 05/19/2015  Nervous, Anxious, on Edge 2 0  Control/stop worrying 2 1  Worry too much - different things 2 1  Trouble relaxing 2 0  Restless 2 0  Easily annoyed or irritable 2 1  Afraid - awful might happen 1 0  Total GAD 7 Score  13 3  Anxiety Difficulty Somewhat difficult Not difficult at all    Assessment & Plan  1. Major depression, recurrent, chronic ([place])   2. GAD (generalized anxiety disorder)  - ALPRAZolam (XANAX) 0.5 MG tablet; Take 1 tablet (0.5 mg total) by mouth 2  (two) times daily as needed. for anxiety  Dispense: 10 tablet; Refill: 0  3. CAD in native artery   4. Calcification of abdominal aorta (HCC)  Continue statin and aspirin   5. Hypothyroidism, adult  Continue medication   6. B12 deficiency   7. Mixed hyperlipidemia  On statin therapy , LDL not at goal , discussed PSK9   8. Insomnia, unspecified type  - QUEtiapine (SEROQUEL) 25 MG tablet; Take 1 tablet (25 mg total) by mouth at bedtime. For sleep  Dispense: 30 tablet; Refill: 2  9. Snoring  -nocturnal polysomnography (NPSG); Future

## 2017-07-17 ENCOUNTER — Other Ambulatory Visit: Payer: Self-pay

## 2017-07-17 ENCOUNTER — Encounter: Payer: Self-pay | Admitting: Family Medicine

## 2017-07-17 DIAGNOSIS — R0683 Snoring: Secondary | ICD-10-CM

## 2017-07-19 DIAGNOSIS — L570 Actinic keratosis: Secondary | ICD-10-CM | POA: Diagnosis not present

## 2017-07-19 DIAGNOSIS — Z85828 Personal history of other malignant neoplasm of skin: Secondary | ICD-10-CM | POA: Diagnosis not present

## 2017-07-27 ENCOUNTER — Other Ambulatory Visit: Payer: Self-pay | Admitting: Family Medicine

## 2017-07-30 ENCOUNTER — Encounter: Payer: Self-pay | Admitting: Family Medicine

## 2017-07-30 ENCOUNTER — Other Ambulatory Visit: Payer: Self-pay | Admitting: Family Medicine

## 2017-07-30 MED ORDER — TEMAZEPAM 15 MG PO CAPS: 15.0000 mg | ORAL_CAPSULE | Freq: Every evening | ORAL | 2 refills | Status: DC | PRN

## 2017-10-23 ENCOUNTER — Encounter: Payer: BLUE CROSS/BLUE SHIELD | Admitting: Family Medicine

## 2017-10-29 ENCOUNTER — Other Ambulatory Visit: Payer: Self-pay | Admitting: Family Medicine

## 2017-10-29 NOTE — Telephone Encounter (Signed)
She is due for follow up. Can she schedule it?

## 2017-10-30 ENCOUNTER — Other Ambulatory Visit: Payer: Self-pay | Admitting: Family Medicine

## 2017-10-30 ENCOUNTER — Encounter: Payer: Self-pay | Admitting: Family Medicine

## 2017-10-30 MED ORDER — TEMAZEPAM 15 MG PO CAPS: 15.0000 mg | ORAL_CAPSULE | Freq: Every evening | ORAL | 0 refills | Status: DC | PRN

## 2017-10-30 NOTE — Telephone Encounter (Signed)
Husband was involved in a MVA, having surgery on Thursday. Unable to leave him for sometime. Would like to know if Dr Ancil Boozer could work her in June 3. Please advise

## 2017-10-30 NOTE — Telephone Encounter (Signed)
LVM for pt to call the office and schedule an appt

## 2017-11-07 NOTE — Telephone Encounter (Signed)
Please advise if this is ok for [place] to overbook, and where should we add her

## 2017-11-09 NOTE — Telephone Encounter (Signed)
I sent 30 days on May 20th, can she be seen before June 20 th?

## 2017-11-16 ENCOUNTER — Other Ambulatory Visit: Payer: Self-pay | Admitting: Family Medicine

## 2017-11-16 DIAGNOSIS — F339 Major depressive disorder, recurrent, unspecified: Secondary | ICD-10-CM

## 2017-11-16 DIAGNOSIS — F411 Generalized anxiety disorder: Secondary | ICD-10-CM

## 2017-11-16 NOTE — Telephone Encounter (Signed)
Refill request for general medication. Duloxetine to [facility].  Last office visit: 07/02/17   Follow up on 11/27/17

## 2017-11-27 ENCOUNTER — Encounter: Payer: Self-pay | Admitting: Family Medicine

## 2017-11-27 ENCOUNTER — Ambulatory Visit (INDEPENDENT_AMBULATORY_CARE_PROVIDER_SITE_OTHER): Payer: BLUE CROSS/BLUE SHIELD | Admitting: Family Medicine

## 2017-11-27 VITALS — BP 110/70 | HR 93 | Resp 16 | Ht 60.0 in | Wt 135.5 lb

## 2017-11-27 DIAGNOSIS — E782 Mixed hyperlipidemia: Secondary | ICD-10-CM | POA: Diagnosis not present

## 2017-11-27 DIAGNOSIS — I700 Atherosclerosis of aorta: Secondary | ICD-10-CM | POA: Diagnosis not present

## 2017-11-27 DIAGNOSIS — Z131 Encounter for screening for diabetes mellitus: Secondary | ICD-10-CM

## 2017-11-27 DIAGNOSIS — I2510 Atherosclerotic heart disease of native coronary artery without angina pectoris: Secondary | ICD-10-CM

## 2017-11-27 DIAGNOSIS — E559 Vitamin D deficiency, unspecified: Secondary | ICD-10-CM

## 2017-11-27 DIAGNOSIS — E039 Hypothyroidism, unspecified: Secondary | ICD-10-CM

## 2017-11-27 DIAGNOSIS — G2581 Restless legs syndrome: Secondary | ICD-10-CM | POA: Diagnosis not present

## 2017-11-27 DIAGNOSIS — Z79899 Other long term (current) drug therapy: Secondary | ICD-10-CM | POA: Diagnosis not present

## 2017-11-27 DIAGNOSIS — M818 Other osteoporosis without current pathological fracture: Secondary | ICD-10-CM

## 2017-11-27 DIAGNOSIS — Z1211 Encounter for screening for malignant neoplasm of colon: Secondary | ICD-10-CM

## 2017-11-27 DIAGNOSIS — E538 Deficiency of other specified B group vitamins: Secondary | ICD-10-CM | POA: Diagnosis not present

## 2017-11-27 DIAGNOSIS — F339 Major depressive disorder, recurrent, unspecified: Secondary | ICD-10-CM

## 2017-11-27 DIAGNOSIS — F411 Generalized anxiety disorder: Secondary | ICD-10-CM

## 2017-11-27 DIAGNOSIS — R030 Elevated blood-pressure reading, without diagnosis of hypertension: Secondary | ICD-10-CM

## 2017-11-27 MED ORDER — THYROID 30 MG PO TABS: ORAL_TABLET | ORAL | 1 refills | Status: DC

## 2017-11-27 MED ORDER — DULOXETINE HCL 60 MG PO CPEP: ORAL_CAPSULE | ORAL | 0 refills | Status: DC

## 2017-11-27 MED ORDER — ROPINIROLE HCL 0.5 MG PO TABS: 0.5000 mg | ORAL_TABLET | Freq: Every day | ORAL | 0 refills | Status: DC

## 2017-11-27 NOTE — Progress Notes (Signed)
Name: Denise Mason   MRN: [id number]    DOB: 11-28-1955   Date:11/27/2017       Progress Note  Subjective  Chief Complaint  Chief Complaint  Patient presents with  . Medication Refill  . Anxiety  . Depression  . ADD    HPI  Major Depression and GAD: she has had multiple episodesof depression, has tried and not tolerated or not responded to Prozac, Zoloft and Wellbutrin. She states some of the medication makes her very angry and is afraid to take them. We started her on Duloxetine back in September 2016 , she stated the first week she had nausea and headache, however those symptoms resolved. She was doing well however her adult son was admitted to a detox facility in [place] ( pain medication addiction after second back surgery- back in April 2016) She is now down to 60 mg daily of Cymbalta. Son is doing well, back to work. Moved the [facility] and is working from home, but travelling more often, getting stressed again. Adjusting to living in [place], however husband had a MVA and she has to care for him ( fractured his hand), but coping well. She states feeling tired and not sleeping well because of leg pain at night  Insomnia: . She was on Temazepam22.5 mg and initially it worked for her, however not waking up refreshed anymore, and granddaughter stated that she snores, also husband has noticed snoring is louder and more frequent. ESS we advised sleep study in the past because of high ESS but she decided not to go through with test. She states has a long history of leg pain at night, however since on lower dose of temazepam she states symptoms worse, needs to get up at night to walk or move legs. States tingling or prickly sensation. Likely RLS  CAD and Precordial chest pain: she had a cath done in 2010 that showed mild LAD lesion. She was seen in June 2016 and had an episode of substernal chest pain, heavy sensation, that was triggered by stress. She was seen by Dr. Saralyn Pilar and had a negative  echo stress test 2017. LAD lesion was stable, she is on Toprol XL, aspirin and Atorvastatin - but not very compliant with statin because of muscle aches, LDL has been above goal, not compliant with crestor, explained importance of taking it daily and she will try to get a weekly medication dispenser.    Hypothyroidism: always has dry skin, weight has been stable, , no fatigue. We will recheck labs  GERD:off medication and is doing well.  Osteoporosis: she has been taking Fosamax and states initially had diarrhea the day after but that has resolved now   Patient Active Problem List   Diagnosis Date Noted  . Fever blister 08/17/2015  . Breast cyst 07/06/2015  . GAD (generalized anxiety disorder) 03/05/2015  . CAD in native artery 03/05/2015  . Hyperlipidemia 03/05/2015  . H/O total hysterectomy 11/30/2014  . History of basal cell cancer 11/30/2014  . Insomnia, persistent 11/30/2014  . Calcification of abdominal aorta (HCC) 11/16/2009  . Vitamin D deficiency 03/30/2009  . Major depression, recurrent, chronic ([place]) 05/13/2008  . Osteoporosis 08/20/2007  . Hypothyroidism, adult 04/30/2007  . ADD 04/30/2007    Past Surgical History:  Procedure Laterality Date  . ABDOMINAL HYSTERECTOMY    . APPENDECTOMY    . BREAST BIOPSY Bilateral    FNA- neg  . BREAST EXCISIONAL BIOPSY Left    benign  . CHOLECYSTECTOMY  1999  .  TOTAL ABDOMINAL HYSTERECTOMY W/ BILATERAL SALPINGOOPHORECTOMY  1987    Family History  Problem Relation Age of Onset  . Cancer Mother 18       Breast Cancer  . Breast cancer Mother 45  . Diabetes Father   . Hypertension Father   . Heart attack Maternal Grandmother   . CAD Maternal Grandmother   . CAD Maternal Uncle   . Stroke Paternal Grandmother   . Cancer Sister        Breast Cancer  . Diabetes Sister   . Depression Sister   . Breast cancer Sister 58  . Allergic rhinitis Son     Social History   Socioeconomic History  . Marital status: Married     Spouse name: Not on file  . Number of children: Not on file  . Years of education: Not on file  . Highest education level: Not on file  Occupational History  . Not on file  Social Needs  . Financial resource strain: Not on file  . Food insecurity:    Worry: Not on file    Inability: Not on file  . Transportation needs:    Medical: Not on file    Non-medical: Not on file  Tobacco Use  . Smoking status: Former Smoker    Packs/day: 1.00    Years: 30.00    Pack years: 30.00    Types: Cigarettes    Start date: 03/05/1983    Last attempt to quit: 06/12/2012    Years since quitting: 5.4  . Smokeless tobacco: Never Used  . Tobacco comment: 1 ppd for 30 years   Substance and Sexual Activity  . Alcohol use: Yes    Alcohol/week: 0.0 oz    Comment: Rarely  . Drug use: No  . Sexual activity: Yes    Partners: Male  Lifestyle  . Physical activity:    Days per week: Not on file    Minutes per session: Not on file  . Stress: Not on file  Relationships  . Social connections:    Talks on phone: Not on file    Gets together: Not on file    Attends religious service: Not on file    Active member of club or organization: Not on file    Attends meetings of clubs or organizations: Not on file    Relationship status: Not on file  . Intimate partner violence:    Fear of current or ex partner: Not on file    Emotionally abused: Not on file    Physically abused: Not on file    Forced sexual activity: Not on file  Other Topics Concern  . Not on file  Social History Narrative   Married, 2 grown children one lives in [place] and one in [place]   She is working remotely now - from home in [place], [place]     Current Outpatient Medications:  .  alendronate (FOSAMAX) 70 MG tablet, Take 1 tablet (70 mg total) by mouth every 7 (seven) days. Take with a full glass of water on an empty stomach., Disp: 4 tablet, Rfl: 11 .  ALPRAZolam (XANAX) 0.5 MG tablet, Take 1 tablet (0.5 mg total) by mouth 2 (two)  times daily as needed. for anxiety, Disp: 10 tablet, Rfl: 0 .  aspirin EC 81 MG tablet, Take 1 tablet (81 mg total) by mouth daily., Disp: 30 tablet, Rfl: 5 .  DULoxetine (CYMBALTA) 60 MG capsule, TAKE 1 CAPSULE(60 MG) BY MOUTH DAILY, Disp: 90 capsule, Rfl:  0 .  nitroGLYCERIN (NITROSTAT) 0.4 MG SL tablet, Place 1 tablet (0.4 mg total) under the tongue every 5 (five) minutes as needed for chest pain., Disp: 20 tablet, Rfl: 2 .  PREMARIN vaginal cream, INSERT 1 APPLICATORFUL VAGINALLY 2 TIMES A WEEK, Disp: 30 g, Rfl: 10 .  rosuvastatin (CRESTOR) 10 MG tablet, TAKE 1 TABLET(10 MG) BY MOUTH DAILY, Disp: 90 tablet, Rfl: 1 .  thyroid (ARMOUR THYROID) 30 MG tablet, TAKE 1 TABLET BY MOUTH EVERY DAY EXCEPT ON SUNDAY TAKE 1 1/2 TABLETS, Disp: 94 tablet, Rfl: 1 .  rOPINIRole (REQUIP) 0.5 MG tablet, Take 1-2 tablets (0.5-1 mg total) by mouth at bedtime., Disp: 60 tablet, Rfl: 0  Allergies  Allergen Reactions  . Aspirin   . Boniva [Ibandronic Acid]     Stomach pain, indigestion, diarrhea  . Bupropion Hcl     REACTION: depression  . Seroquel [Quetiapine Fumarate]     Dry mouth, dizziness  . Varenicline Tartrate     REACTION: depression     ROS  Constitutional: Negative for fever or weight change.  Respiratory: Negative for cough and shortness of breath.   Cardiovascular: Negative for chest pain or palpitations.  Gastrointestinal: Negative for abdominal pain, no bowel changes.  Musculoskeletal: Negative for gait problem or joint swelling.  Skin: Negative for rash.  Neurological: Negative for dizziness or headache.  No other specific complaints in a complete review of systems (except as listed in HPI above).  Objective  Vitals:   11/27/17 1304 11/27/17 1350  BP: (!) 120/98 110/70  Pulse: 93   Resp: 16   SpO2: 97%   Weight: 135 lb 8 oz (61.5 kg)   Height: 5' (1.524 m)     Body mass index is 26.46 kg/m.  Physical Exam  Constitutional: Patient appears well-developed and  well-nourished. No distress.  HEENT: head atraumatic, normocephalic, pupils equal and reactive to light, neck supple, throat within normal limits Cardiovascular: Normal rate, regular rhythm and normal heart sounds.  No murmur heard. No BLE edema. Pulmonary/Chest: Effort normal and breath sounds normal. No respiratory distress. Abdominal: Soft.  There is no tenderness. Psychiatric: Patient has a normal mood and affect. behavior is normal. Judgment and thought content normal.   PHQ2/9: Depression screen [facility] 2/9 11/27/2017 07/02/2017 03/06/2017 12/01/2016 08/31/2016  Decreased Interest 0 1 0 0 0  Down, Depressed, Hopeless 0 0 0 0 0  PHQ - 2 Score 0 1 0 0 0  Altered sleeping 3 - - - -  Tired, decreased energy 1 - - - -  Change in appetite 1 - - - -  Feeling bad or failure about yourself  0 - - - -  Trouble concentrating 0 - - - -  Moving slowly or fidgety/restless 0 - - - -  Suicidal thoughts 0 - - - -  PHQ-9 Score 5 - - - -  Difficult doing work/chores Somewhat difficult - - - -    Fall Risk: Fall Risk  11/27/2017 07/02/2017 03/06/2017 12/01/2016 08/31/2016  Falls in the past year? No No No No No    Functional Status Survey: Is the patient deaf or have difficulty hearing?: No Does the patient have difficulty seeing, even when wearing glasses/contacts?: No Does the patient have difficulty concentrating, remembering, or making decisions?: No Does the patient have difficulty walking or climbing stairs?: No Does the patient have difficulty dressing or bathing?: No Does the patient have difficulty doing errands alone such as visiting a doctor's office or shopping?: No  Assessment & Plan  1. Major depression, recurrent, chronic (HCC)  - DULoxetine (CYMBALTA) 60 MG capsule; TAKE 1 CAPSULE(60 MG) BY MOUTH DAILY  Dispense: 90 capsule; Refill: 0  2. GAD (generalized anxiety disorder)  - DULoxetine (CYMBALTA) 60 MG capsule; TAKE 1 CAPSULE(60 MG) BY MOUTH DAILY  Dispense: 90 capsule; Refill:  0  3. Mixed hyperlipidemia  - Lipid panel  4. Hypothyroidism, adult  - thyroid (ARMOUR THYROID) 30 MG tablet; TAKE 1 TABLET BY MOUTH EVERY DAY EXCEPT ON SUNDAY TAKE 1 1/2 TABLETS  Dispense: 94 tablet; Refill: 1 - TSH  5. Calcification of abdominal aorta (HCC)  Needs to take statin therapy daily, discussed daily pill box  6. CAD in native artery  No recent episodes, never had to take NTG  7. B12 deficiency  - Vitamin B12  8. Long-term use of high-risk medication  - COMPLETE METABOLIC PANEL WITH GFR - CBC with Differential/Platelet  9. Other osteoporosis without current pathological fracture  Continue Fosamax  10. RLS (restless legs syndrome)  - rOPINIRole (REQUIP) 0.5 MG tablet; Take 1-2 tablets (0.5-1 mg total) by mouth at bedtime.  Dispense: 60 tablet; Refill: 0 If does not work we can try [employer] She will call back with dose that works for her She states  -CBC  -ferritin  11. Diabetes mellitus screening  - Hemoglobin A1c  12. Vitamin D deficiency  - VITAMIN D 25 Hydroxy (Vit-D Deficiency, Fractures)  13. Elevated blood pressure reading  We will recheck before she left our office was back to normal   14. Colon cancer screening  - Ambulatory referral to Gastroenterology

## 2017-11-28 LAB — VITAMIN B12

## 2017-11-28 LAB — COMPLETE METABOLIC PANEL WITH GFR
AG Ratio: 1.7 (calc) (ref 1.0–2.5)
ALBUMIN MSPROF: 4.7 g/dL (ref 3.6–5.1)
ALKALINE PHOSPHATASE (APISO): 117 U/L (ref 33–130)
ALT: 13 U/L (ref 6–29)
AST: 19 U/L (ref 10–35)
BILIRUBIN TOTAL: 0.3 mg/dL (ref 0.2–1.2)
BUN: 18 mg/dL (ref 7–25)
CHLORIDE: 102 mmol/L (ref 98–110)
CO2: 27 mmol/L (ref 20–32)
Calcium: 9.9 mg/dL (ref 8.6–10.4)
Creat: 0.71 mg/dL (ref 0.50–0.99)
GFR, Est African American: 107 mL/min/{1.73_m2} (ref >=60)
GFR, Est Non African American: 93 mL/min/{1.73_m2} (ref >=60)
GLUCOSE: 91 mg/dL (ref 65–139)
Globulin: 2.8 g/dL (calc) (ref 1.9–3.7)
Potassium: 3.7 mmol/L (ref 3.5–5.3)
SODIUM: 138 mmol/L (ref 135–146)
Total Protein: 7.5 g/dL (ref 6.1–8.1)

## 2017-11-28 LAB — CBC WITH DIFFERENTIAL/PLATELET
Basophils Absolute: 62 cells/uL (ref 0–200)
Basophils Relative: 0.9 %
EOS ABS: 428 {cells}/uL (ref 15–500)
Eosinophils Relative: 6.2 %
HCT: 38.7 % (ref 35.0–45.0)
Hemoglobin: 13.1 g/dL (ref 11.7–15.5)
Lymphs Abs: 1594 cells/uL (ref 850–3900)
MCH: 27.5 pg (ref 27.0–33.0)
MCHC: 33.9 g/dL (ref 32.0–36.0)
MCV: 81.3 fL (ref 80.0–100.0)
MPV: 10 fL (ref 7.5–12.5)
Monocytes Relative: 8.2 %
NEUTROS PCT: 61.6 %
Neutro Abs: 4250 cells/uL (ref 1500–7800)
PLATELETS: 347 10*3/uL (ref 140–400)
RBC: 4.76 10*6/uL (ref 3.80–5.10)
RDW: 12.7 % (ref 11.0–15.0)
TOTAL LYMPHOCYTE: 23.1 %
WBC: 6.9 10*3/uL (ref 3.8–10.8)
WBCMIX: 566 {cells}/uL (ref 200–950)

## 2017-11-28 LAB — LIPID PANEL
CHOLESTEROL: 243 mg/dL — AB (ref <200)
HDL: 53 mg/dL (ref >50)
LDL CHOLESTEROL (CALC): 157 mg/dL — AB
Non-HDL Cholesterol (Calc): 190 mg/dL (calc) — ABNORMAL HIGH (ref <130)
Total CHOL/HDL Ratio: 4.6 (calc) (ref <5.0)
Triglycerides: 179 mg/dL — ABNORMAL HIGH (ref <150)

## 2017-11-28 LAB — HEMOGLOBIN A1C
EAG (MMOL/L): 6.2 (calc)
HEMOGLOBIN A1C: 5.5 %{Hb} (ref <5.7)
MEAN PLASMA GLUCOSE: 111 (calc)

## 2017-11-28 LAB — VITAMIN D 25 HYDROXY (VIT D DEFICIENCY, FRACTURES): VIT D 25 HYDROXY: 46 ng/mL (ref 30–100)

## 2017-11-28 LAB — TSH: TSH: 3.12 mIU/L (ref 0.40–4.50)

## 2017-11-28 LAB — FERRITIN: Ferritin: 45 ng/mL (ref 16–232)

## 2017-12-03 ENCOUNTER — Encounter: Payer: Self-pay | Admitting: Family Medicine

## 2017-12-04 ENCOUNTER — Encounter: Payer: Self-pay | Admitting: Family Medicine

## 2017-12-05 ENCOUNTER — Other Ambulatory Visit: Payer: Self-pay | Admitting: Family Medicine

## 2017-12-05 MED ORDER — GABAPENTIN 100 MG PO CAPS: 100.0000 mg | ORAL_CAPSULE | Freq: Every day | ORAL | 0 refills | Status: DC

## 2017-12-18 ENCOUNTER — Encounter: Payer: Self-pay | Admitting: *Deleted

## 2017-12-24 ENCOUNTER — Other Ambulatory Visit: Payer: Self-pay

## 2017-12-24 DIAGNOSIS — Z1211 Encounter for screening for malignant neoplasm of colon: Secondary | ICD-10-CM

## 2017-12-26 DIAGNOSIS — G5762 Lesion of plantar nerve, left lower limb: Secondary | ICD-10-CM | POA: Diagnosis not present

## 2017-12-26 DIAGNOSIS — B351 Tinea unguium: Secondary | ICD-10-CM | POA: Diagnosis not present

## 2017-12-26 DIAGNOSIS — L603 Nail dystrophy: Secondary | ICD-10-CM | POA: Diagnosis not present

## 2018-01-03 ENCOUNTER — Telehealth: Payer: Self-pay | Admitting: Gastroenterology

## 2018-01-03 NOTE — Telephone Encounter (Signed)
Pt is calling to reschedule her procedure for 8/12 to first  September or October

## 2018-01-04 NOTE — Telephone Encounter (Signed)
LVM for pt to let her know we do have availability for the first week of September, and to call me back to get a date.  Thanks [employer]

## 2018-01-09 ENCOUNTER — Telehealth: Payer: Self-pay

## 2018-01-09 NOTE — Telephone Encounter (Signed)
LVM for pt to call office back to move her colonoscopy date from 01/21/18 to early September.  Thanks [employer]

## 2018-01-21 ENCOUNTER — Ambulatory Visit
Admission: RE | Admit: 2018-01-21 | Payer: BLUE CROSS/BLUE SHIELD | Source: Ambulatory Visit | Admitting: Gastroenterology

## 2018-01-21 ENCOUNTER — Encounter: Admission: RE | Payer: Self-pay | Source: Ambulatory Visit

## 2018-01-21 SURGERY — COLONOSCOPY WITH PROPOFOL
Anesthesia: General

## 2018-02-03 ENCOUNTER — Other Ambulatory Visit: Payer: Self-pay | Admitting: Family Medicine

## 2018-03-01 ENCOUNTER — Other Ambulatory Visit: Payer: Self-pay | Admitting: Family Medicine

## 2018-03-02 NOTE — Telephone Encounter (Signed)
How is she taking? 300 mg Qhs?

## 2018-03-04 NOTE — Telephone Encounter (Signed)
I called this patient to verify how she has been taking the requested medication but there was no answer. A message was left for her to give [place] a call back to let us know exactly how she has been taking the gabapentin when she got the chance.

## 2018-03-04 NOTE — Telephone Encounter (Signed)
Pt is calling back to  Say that she sometimes takes 3 gabapentin at night and then sometime 2 at a time   Asked if she to more of one amount or the other and she states it is about even

## 2018-03-06 DIAGNOSIS — M79675 Pain in left toe(s): Secondary | ICD-10-CM | POA: Diagnosis not present

## 2018-03-06 DIAGNOSIS — G5762 Lesion of plantar nerve, left lower limb: Secondary | ICD-10-CM | POA: Diagnosis not present

## 2018-03-12 ENCOUNTER — Encounter: Payer: BLUE CROSS/BLUE SHIELD | Admitting: Family Medicine

## 2018-03-27 DIAGNOSIS — B351 Tinea unguium: Secondary | ICD-10-CM | POA: Diagnosis not present

## 2018-03-27 DIAGNOSIS — M79675 Pain in left toe(s): Secondary | ICD-10-CM | POA: Diagnosis not present

## 2018-03-27 DIAGNOSIS — G5762 Lesion of plantar nerve, left lower limb: Secondary | ICD-10-CM | POA: Diagnosis not present

## 2018-04-03 ENCOUNTER — Other Ambulatory Visit: Payer: Self-pay | Admitting: Family Medicine

## 2018-04-15 ENCOUNTER — Encounter

## 2018-04-15 ENCOUNTER — Encounter: Payer: Self-pay | Admitting: Family Medicine

## 2018-04-15 ENCOUNTER — Ambulatory Visit: Payer: BLUE CROSS/BLUE SHIELD | Admitting: Family Medicine

## 2018-04-15 VITALS — BP 108/68 | HR 72 | Temp 98.0°F | Resp 16 | Ht 60.0 in | Wt 137.0 lb

## 2018-04-15 DIAGNOSIS — E538 Deficiency of other specified B group vitamins: Secondary | ICD-10-CM

## 2018-04-15 DIAGNOSIS — Z1239 Encounter for other screening for malignant neoplasm of breast: Secondary | ICD-10-CM

## 2018-04-15 DIAGNOSIS — G4700 Insomnia, unspecified: Secondary | ICD-10-CM

## 2018-04-15 DIAGNOSIS — E039 Hypothyroidism, unspecified: Secondary | ICD-10-CM

## 2018-04-15 DIAGNOSIS — E782 Mixed hyperlipidemia: Secondary | ICD-10-CM

## 2018-04-15 DIAGNOSIS — F411 Generalized anxiety disorder: Secondary | ICD-10-CM

## 2018-04-15 DIAGNOSIS — Z23 Encounter for immunization: Secondary | ICD-10-CM

## 2018-04-15 DIAGNOSIS — M818 Other osteoporosis without current pathological fracture: Secondary | ICD-10-CM

## 2018-04-15 DIAGNOSIS — Z79899 Other long term (current) drug therapy: Secondary | ICD-10-CM

## 2018-04-15 DIAGNOSIS — F339 Major depressive disorder, recurrent, unspecified: Secondary | ICD-10-CM

## 2018-04-15 DIAGNOSIS — G2581 Restless legs syndrome: Secondary | ICD-10-CM

## 2018-04-15 DIAGNOSIS — I700 Atherosclerosis of aorta: Secondary | ICD-10-CM

## 2018-04-15 DIAGNOSIS — I2510 Atherosclerotic heart disease of native coronary artery without angina pectoris: Secondary | ICD-10-CM

## 2018-04-15 MED ORDER — GABAPENTIN 300 MG PO CAPS: 300.0000 mg | ORAL_CAPSULE | Freq: Every day | ORAL | 1 refills | Status: DC

## 2018-04-15 MED ORDER — ROSUVASTATIN CALCIUM 10 MG PO TABS: ORAL_TABLET | ORAL | 1 refills | Status: DC

## 2018-04-15 MED ORDER — KRILL OIL OMEGA-3 500 MG PO CAPS: 1.0000 | ORAL_CAPSULE | Freq: Every day | ORAL | 0 refills | Status: AC

## 2018-04-15 MED ORDER — ALENDRONATE SODIUM 70 MG PO TABS: 70.0000 mg | ORAL_TABLET | ORAL | 3 refills | Status: DC

## 2018-04-15 MED ORDER — DULOXETINE HCL 60 MG PO CPEP: ORAL_CAPSULE | ORAL | 1 refills | Status: DC

## 2018-04-15 MED ORDER — THYROID 30 MG PO TABS: ORAL_TABLET | ORAL | 1 refills | Status: DC

## 2018-04-15 NOTE — Progress Notes (Signed)
Name: Denise Mason   MRN: [id number]    DOB: December 22, 1955   Date:04/15/2018       Progress Note  Subjective  Chief Complaint  Chief Complaint  Patient presents with  . Follow-up  . Medication Refill  . Anxiety  . Depression  . ADD  . Coronary Artery Disease  . Insomnia  . Gastroesophageal Reflux  . Osteoporosis  . Hypothyroidism    HPI  Major Depression and GAD: she has had multiple episodesof depression, she  has tried and not tolerated or not responded to Prozac, Zoloft and Wellbutrin. She states some of the medication makes her very angry and is afraid to take them. We started her on Duloxetine back in September 2016 , she stated the first week she had nausea and headache, however those symptoms resolved. She was doing well however her adult son was admitted to a detox facility in [place] ( pain medication addiction after second back surgery- back in April 2016) She is now down to 60 mg daily of Cymbalta.She is off alprazolam. Her husband is still recovering from [place] , son is doing well.   Insomnia: . Shewason Temazepam22.5 mgand initially it worked for her, however not waking up refreshed anymore, and granddaughter stated that she snores, also husband has noticed snoring is louder and more frequent. ESS we advised sleep study in the past because of high ESS but she decided not to go through with test. She has long history of RLS and since started on Gabapentin she has been sleeping better, still feels tired during the day, but thinks because of work. She is happy with current medication regiment   CAD and Precordial chest pain: she had a cath done in 2010 that showed mild LAD lesion. She was seen in June 2016 and had an episode of substernal chest pain, heavy sensation, that was triggered by stress. She was seen by Dr. Saralyn Pilar and had a negative echo stress test 2017. LAD lesion was stable, she is on Toprol XL, aspirin and Atorvastatin - but not very compliant with statin  because of muscle aches. Last LDL was elevated and we changed to Crestor 10 mg this past Summer, we will recheck level, we will recheck labs today   Hypothyroidism: always has dry skin, weight has been stable, , no fatigue. Last TSH was at goal   GERD:off medication and is doing well.  Osteoporosis: she has been taking Fosamax and states initially had diarrhea the day after but that has resolved now, she has been compliant of medication   Patient Active Problem List   Diagnosis Date Noted  . Fever blister 08/17/2015  . Breast cyst 07/06/2015  . GAD (generalized anxiety disorder) 03/05/2015  . CAD in native artery 03/05/2015  . Hyperlipidemia 03/05/2015  . H/O total hysterectomy 11/30/2014  . History of basal cell cancer 11/30/2014  . Insomnia, persistent 11/30/2014  . Calcification of abdominal aorta (HCC) 11/16/2009  . Vitamin D deficiency 03/30/2009  . Major depression, recurrent, chronic ([place]) 05/13/2008  . Osteoporosis 08/20/2007  . Hypothyroidism, adult 04/30/2007  . ADD 04/30/2007    Past Surgical History:  Procedure Laterality Date  . ABDOMINAL HYSTERECTOMY    . APPENDECTOMY    . BREAST BIOPSY Bilateral    FNA- neg  . BREAST EXCISIONAL BIOPSY Left    benign  . CHOLECYSTECTOMY  1999  . TOTAL ABDOMINAL HYSTERECTOMY W/ BILATERAL SALPINGOOPHORECTOMY  1987    Family History  Problem Relation Age of Onset  . Cancer Mother  43       Breast Cancer  . Breast cancer Mother 29  . Diabetes Father   . Hypertension Father   . Heart attack Maternal Grandmother   . CAD Maternal Grandmother   . CAD Maternal Uncle   . Stroke Paternal Grandmother   . Cancer Sister        Breast Cancer  . Diabetes Sister   . Depression Sister   . Breast cancer Sister 38  . Allergic rhinitis Son   . Allergic rhinitis Son     Social History   Socioeconomic History  . Marital status: Married    Spouse name: Louie Casa  . Number of children: 2  . Years of education: Not on file  .  Highest education level: High school graduate  Occupational History  . Not on file  Social Needs  . Financial resource strain: Not hard at all  . Food insecurity:    Worry: Never true    Inability: Never true  . Transportation needs:    Medical: No    Non-medical: No  Tobacco Use  . Smoking status: Former Smoker    Packs/day: 1.00    Years: 30.00    Pack years: 30.00    Types: Cigarettes    Start date: 03/05/1983    Last attempt to quit: 06/12/2012    Years since quitting: 5.8  . Smokeless tobacco: Never Used  . Tobacco comment: 1 ppd for 30 years   Substance and Sexual Activity  . Alcohol use: Yes    Alcohol/week: 0.0 standard drinks    Comment: Rarely  . Drug use: No  . Sexual activity: Yes    Partners: Male  Lifestyle  . Physical activity:    Days per week: 2 days    Minutes per session: 60 min  . Stress: Only a little  Relationships  . Social connections:    Talks on phone: More than three times a week    Gets together: Once a week    Attends religious service: More than 4 times per year    Active member of club or organization: Yes    Attends meetings of clubs or organizations: More than 4 times per year    Relationship status: Married  . Intimate partner violence:    Fear of current or ex partner: No    Emotionally abused: No    Physically abused: No    Forced sexual activity: No  Other Topics Concern  . Not on file  Social History Narrative   Married, 2 grown children one lives in [place] and one in [place]   She is working remotely now - from home in [place], [place]     Current Outpatient Medications:  .  alendronate (FOSAMAX) 70 MG tablet, Take 1 tablet (70 mg total) by mouth every 7 (seven) days. Take with a full glass of water on an empty stomach., Disp: 4 tablet, Rfl: 11 .  aspirin EC 81 MG tablet, Take 1 tablet (81 mg total) by mouth daily., Disp: 30 tablet, Rfl: 5 .  DULoxetine (CYMBALTA) 60 MG capsule, TAKE 1 CAPSULE(60 MG) BY MOUTH DAILY, Disp: 90  capsule, Rfl: 0 .  gabapentin (NEURONTIN) 100 MG capsule, TAKE 1 TO 3 CAPSULES(100 TO 300 MG) BY MOUTH AT BEDTIME. START AT 1 AND. INCREASE BY 1 EVERY 3 DAYS. MAX 300 MG, Disp: 90 capsule, Rfl: 0 .  nitroGLYCERIN (NITROSTAT) 0.4 MG SL tablet, Place 1 tablet (0.4 mg total) under the tongue every 5 (five)  minutes as needed for chest pain., Disp: 20 tablet, Rfl: 2 .  PREMARIN vaginal cream, INSERT 1 APPLICATORFUL VAGINALLY 2 TIMES A WEEK, Disp: 30 g, Rfl: 10 .  rosuvastatin (CRESTOR) 10 MG tablet, TAKE 1 TABLET(10 MG) BY MOUTH DAILY, Disp: 90 tablet, Rfl: 1 .  thyroid (ARMOUR THYROID) 30 MG tablet, TAKE 1 TABLET BY MOUTH EVERY DAY EXCEPT ON SUNDAY TAKE 1 1/2 TABLETS, Disp: 94 tablet, Rfl: 1 .  ALPRAZolam (XANAX) 0.5 MG tablet, Take 1 tablet (0.5 mg total) by mouth 2 (two) times daily as needed. for anxiety (Patient not taking: Reported on 04/15/2018), Disp: 10 tablet, Rfl: 0 .  valACYclovir (VALTREX) 1000 MG tablet, , Disp: , Rfl: 1  Allergies  Allergen Reactions  . Aspirin   . Boniva [Ibandronic Acid]     Stomach pain, indigestion, diarrhea  . Bupropion Hcl     REACTION: depression  . Seroquel [Quetiapine Fumarate]     Dry mouth, dizziness  . Varenicline Tartrate     REACTION: depression    I personally reviewed active problem list, medication list, allergies, family history, social history with the patient/caregiver today.   ROS  Constitutional: Negative for fever or weight change.  Respiratory: Negative for cough and shortness of breath.   Cardiovascular: Negative for chest pain or palpitations.  Gastrointestinal: Negative for abdominal pain, no bowel changes.  Musculoskeletal: Negative for gait problem or joint swelling.  Skin: Negative for rash.  Neurological: Negative for dizziness or headache.  No other specific complaints in a complete review of systems (except as listed in HPI above).  Objective  Vitals:   04/15/18 1412  BP: 108/68  Pulse: 72  Resp: 16  Temp: 98 F  (36.7 C)  TempSrc: Oral  SpO2: 98%  Weight: 137 lb (62.1 kg)  Height: 5' (1.524 m)    Body mass index is 26.76 kg/m.  Physical Exam  Constitutional: Patient appears well-developed and well-nourished. Overweight.  No distress.  HEENT: head atraumatic, normocephalic, pupils equal and reactive to light,neck supple, throat within normal limits Cardiovascular: Normal rate, regular rhythm and normal heart sounds.  No murmur heard. No BLE edema. Pulmonary/Chest: Effort normal and breath sounds normal. No respiratory distress. Abdominal: Soft.  There is no tenderness. Psychiatric: Patient has a normal mood and affect. behavior is normal. Judgment and thought content normal.   PHQ2/9: Depression screen [facility] 2/9 04/15/2018 11/27/2017 07/02/2017 03/06/2017 12/01/2016  Decreased Interest 0 0 1 0 0  Down, Depressed, Hopeless 0 0 0 0 0  PHQ - 2 Score 0 0 1 0 0  Altered sleeping 3 3 - - -  Tired, decreased energy 3 1 - - -  Change in appetite 0 1 - - -  Feeling bad or failure about yourself  0 0 - - -  Trouble concentrating 0 0 - - -  Moving slowly or fidgety/restless 0 0 - - -  Suicidal thoughts 0 0 - - -  PHQ-9 Score 6 5 - - -  Difficult doing work/chores Not difficult at all Somewhat difficult - - -  Some recent data might be hidden     Fall Risk: Fall Risk  04/15/2018 11/27/2017 07/02/2017 03/06/2017 12/01/2016  Falls in the past year? 0 No No No No     Functional Status Survey: Is the patient deaf or have difficulty hearing?: No Does the patient have difficulty seeing, even when wearing glasses/contacts?: No Does the patient have difficulty concentrating, remembering, or making decisions?: No Does the patient have difficulty  walking or climbing stairs?: No Does the patient have difficulty dressing or bathing?: No Does the patient have difficulty doing errands alone such as visiting a doctor's office or shopping?: No   Assessment & Plan  1. Major depression, recurrent, chronic  (HCC)  - DULoxetine (CYMBALTA) 60 MG capsule; TAKE 1 CAPSULE(60 MG) BY MOUTH DAILY  Dispense: 90 capsule; Refill: 1  2. GAD (generalized anxiety disorder)  - DULoxetine (CYMBALTA) 60 MG capsule; TAKE 1 CAPSULE(60 MG) BY MOUTH DAILY  Dispense: 90 capsule; Refill: 1  3. Mixed hyperlipidemia  - Krill Oil Omega-3 500 MG CAPS; Take 1 capsule by mouth daily.  Dispense: 30 capsule; Refill: 0 - rosuvastatin (CRESTOR) 10 MG tablet; TAKE 1 TABLET(10 MG) BY MOUTH DAILY  Dispense: 90 tablet; Refill: 1  4. Hypothyroidism, adult  - thyroid (ARMOUR THYROID) 30 MG tablet; TAKE 1 TABLET BY MOUTH EVERY DAY EXCEPT ON SUNDAY TAKE 1 1/2 TABLETS  Dispense: 94 tablet; Refill: 1  5. Calcification of abdominal aorta (HCC)  She states she has been more compliant with medication and we will recheck lab work today   6. CAD in native artery  No recent symptoms  7. B12 deficiency   Recheck level, it was too high last visit   8. Long-term use of high-risk medication  - comp panel   9. Other osteoporosis without current pathological fracture  - alendronate (FOSAMAX) 70 MG tablet; Take 1 tablet (70 mg total) by mouth every 7 (seven) days. Take with a full glass of water on an empty stomach.  Dispense: 12 tablet; Refill: 3  10. RLS (restless legs syndrome)  - gabapentin (NEURONTIN) 300 MG capsule; Take 1 capsule (300 mg total) by mouth at bedtime.  Dispense: 90 capsule; Refill: 1  11. Insomnia, persistent  - gabapentin (NEURONTIN) 300 MG capsule; Take 1 capsule (300 mg total) by mouth at bedtime.  Dispense: 90 capsule; Refill: 1  12. Needs flu shot  - Flu Vaccine QUAD 6+ mos PF IM (Fluarix Quad PF)  13. Breast cancer screening  - MM DIGITAL SCREENING BILATERAL; Future   14. Need for vaccination for pneumococcus  - Pneumococcal polysaccharide vaccine 23-valent greater than or equal to 2yo subcutaneous/IM

## 2018-04-18 ENCOUNTER — Encounter: Payer: Self-pay | Admitting: Family Medicine

## 2018-04-19 DIAGNOSIS — R52 Pain, unspecified: Secondary | ICD-10-CM | POA: Diagnosis not present

## 2018-06-13 ENCOUNTER — Ambulatory Visit
Admission: RE | Admit: 2018-06-13 | Discharge: 2018-06-13 | Disposition: A | Discharge status: Home / Self Care | Payer: BLUE CROSS/BLUE SHIELD | Source: Ambulatory Visit | Attending: Family Medicine | Admitting: Family Medicine

## 2018-06-13 ENCOUNTER — Other Ambulatory Visit: Payer: Self-pay | Admitting: Family Medicine

## 2018-06-13 DIAGNOSIS — Z1239 Encounter for other screening for malignant neoplasm of breast: Secondary | ICD-10-CM

## 2018-06-13 DIAGNOSIS — N6310 Unspecified lump in the right breast, unspecified quadrant: Secondary | ICD-10-CM

## 2018-06-17 DIAGNOSIS — M1812 Unilateral primary osteoarthritis of first carpometacarpal joint, left hand: Secondary | ICD-10-CM | POA: Diagnosis not present

## 2018-06-19 DIAGNOSIS — G5762 Lesion of plantar nerve, left lower limb: Secondary | ICD-10-CM | POA: Diagnosis not present

## 2018-06-25 ENCOUNTER — Ambulatory Visit
Admission: RE | Admit: 2018-06-25 | Discharge: 2018-06-25 | Disposition: A | Discharge status: Home / Self Care | Payer: BLUE CROSS/BLUE SHIELD | Source: Ambulatory Visit | Attending: Family Medicine | Admitting: Family Medicine

## 2018-06-25 ENCOUNTER — Encounter: Payer: Self-pay | Admitting: Family Medicine

## 2018-06-25 ENCOUNTER — Ambulatory Visit (INDEPENDENT_AMBULATORY_CARE_PROVIDER_SITE_OTHER): Payer: BLUE CROSS/BLUE SHIELD | Admitting: Family Medicine

## 2018-06-25 ENCOUNTER — Other Ambulatory Visit: Payer: Self-pay

## 2018-06-25 VITALS — BP 111/66 | HR 95 | Temp 98.1°F | Resp 16 | Ht 60.0 in | Wt 138.8 lb

## 2018-06-25 DIAGNOSIS — Z87891 Personal history of nicotine dependence: Secondary | ICD-10-CM | POA: Diagnosis not present

## 2018-06-25 DIAGNOSIS — R928 Other abnormal and inconclusive findings on diagnostic imaging of breast: Secondary | ICD-10-CM | POA: Diagnosis not present

## 2018-06-25 DIAGNOSIS — E039 Hypothyroidism, unspecified: Secondary | ICD-10-CM

## 2018-06-25 DIAGNOSIS — L853 Xerosis cutis: Secondary | ICD-10-CM

## 2018-06-25 DIAGNOSIS — M810 Age-related osteoporosis without current pathological fracture: Secondary | ICD-10-CM | POA: Diagnosis not present

## 2018-06-25 DIAGNOSIS — Z1211 Encounter for screening for malignant neoplasm of colon: Secondary | ICD-10-CM

## 2018-06-25 DIAGNOSIS — M19049 Primary osteoarthritis, unspecified hand: Secondary | ICD-10-CM | POA: Insufficient documentation

## 2018-06-25 DIAGNOSIS — N952 Postmenopausal atrophic vaginitis: Secondary | ICD-10-CM

## 2018-06-25 DIAGNOSIS — Z01419 Encounter for gynecological examination (general) (routine) without abnormal findings: Secondary | ICD-10-CM

## 2018-06-25 DIAGNOSIS — N6310 Unspecified lump in the right breast, unspecified quadrant: Secondary | ICD-10-CM

## 2018-06-25 DIAGNOSIS — N6312 Unspecified lump in the right breast, upper inner quadrant: Secondary | ICD-10-CM | POA: Diagnosis not present

## 2018-06-25 MED ORDER — AMMONIUM LACTATE 12 % EX LOTN: 1.0000 "application " | TOPICAL_LOTION | CUTANEOUS | 0 refills | Status: DC | PRN

## 2018-06-25 MED ORDER — ARMOUR THYROID 30 MG PO TABS: ORAL_TABLET | ORAL | 1 refills | Status: DC

## 2018-06-25 MED ORDER — PREMARIN 0.625 MG/GM VA CREA: TOPICAL_CREAM | VAGINAL | 10 refills | Status: DC

## 2018-06-25 NOTE — Patient Instructions (Signed)
Preventive Care 40-64 Years, Female Preventive care refers to lifestyle choices and visits with your health care provider that can promote health and wellness. What does preventive care include?   A yearly physical exam. This is also called an annual well check.  Dental exams once or twice a year.  Routine eye exams. Ask your health care provider how often you should have your eyes checked.  Personal lifestyle choices, including: ? Daily care of your teeth and gums. ? Regular physical activity. ? Eating a healthy diet. ? Avoiding tobacco and drug use. ? Limiting alcohol use. ? Practicing safe sex. ? Taking low-dose aspirin daily starting at age 50. ? Taking vitamin and mineral supplements as recommended by your health care provider. What happens during an annual well check? The services and screenings done by your health care provider during your annual well check will depend on your age, overall health, lifestyle risk factors, and family history of disease. Counseling Your health care provider may ask you questions about your:  Alcohol use.  Tobacco use.  Drug use.  Emotional well-being.  Home and relationship well-being.  Sexual activity.  Eating habits.  Work and work environment.  Method of birth control.  Menstrual cycle.  Pregnancy history. Screening You may have the following tests or measurements:  Height, weight, and BMI.  Blood pressure.  Lipid and cholesterol levels. These may be checked every 5 years, or more frequently if you are over 50 years old.  Skin check.  Lung cancer screening. You may have this screening every year starting at age 55 if you have a 30-pack-year history of smoking and currently smoke or have quit within the past 15 years.  Colorectal cancer screening. All adults should have this screening starting at age 50 and continuing until age 75. Your health care provider may recommend screening at age 45. You will have tests every  1-10 years, depending on your results and the type of screening test. People at increased risk should start screening at an earlier age. Screening tests may include: ? Guaiac-based fecal occult blood testing. ? Fecal immunochemical test (FIT). ? Stool DNA test. ? Virtual colonoscopy. ? Sigmoidoscopy. During this test, a flexible tube with a tiny camera (sigmoidoscope) is used to examine your rectum and lower colon. The sigmoidoscope is inserted through your anus into your rectum and lower colon. ? Colonoscopy. During this test, a long, thin, flexible tube with a tiny camera (colonoscope) is used to examine your entire colon and rectum.  Hepatitis C blood test.  Hepatitis B blood test.  Sexually transmitted disease (STD) testing.  Diabetes screening. This is done by checking your blood sugar (glucose) after you have not eaten for a while (fasting). You may have this done every 1-3 years.  Mammogram. This may be done every 1-2 years. Talk to your health care provider about when you should start having regular mammograms. This may depend on whether you have a family history of breast cancer.  BRCA-related cancer screening. This may be done if you have a family history of breast, ovarian, tubal, or peritoneal cancers.  Pelvic exam and Pap test. This may be done every 3 years starting at age 21. Starting at age 30, this may be done every 5 years if you have a Pap test in combination with an HPV test.  Bone density scan. This is done to screen for osteoporosis. You may have this scan if you are at high risk for osteoporosis. Discuss your test results, treatment options,   and if necessary, the need for more tests with your health care provider. Vaccines Your health care provider may recommend certain vaccines, such as:  Influenza vaccine. This is recommended every year.  Tetanus, diphtheria, and acellular pertussis (Tdap, Td) vaccine. You may need a Td booster every 10 years.  Varicella  vaccine. You may need this if you have not been vaccinated.  Zoster vaccine. You may need this after age 38.  Measles, mumps, and rubella (MMR) vaccine. You may need at least one dose of MMR if you were born in 1957 or later. You may also need a second dose.  Pneumococcal 13-valent conjugate (PCV13) vaccine. You may need this if you have certain conditions and were not previously vaccinated.  Pneumococcal polysaccharide (PPSV23) vaccine. You may need one or two doses if you smoke cigarettes or if you have certain conditions.  Meningococcal vaccine. You may need this if you have certain conditions.  Hepatitis A vaccine. You may need this if you have certain conditions or if you travel or work in places where you may be exposed to hepatitis A.  Hepatitis B vaccine. You may need this if you have certain conditions or if you travel or work in places where you may be exposed to hepatitis B.  Haemophilus influenzae type b (Hib) vaccine. You may need this if you have certain conditions. Talk to your health care provider about which screenings and vaccines you need and how often you need them. This information is not intended to replace advice given to you by your health care provider. Make sure you discuss any questions you have with your health care provider. Document Released: 06/25/2015 Document Revised: 07/19/2017 Document Reviewed: 03/30/2015 Elsevier Interactive Patient Education  2019 [employer].

## 2018-06-25 NOTE — Progress Notes (Signed)
Name: Denise Mason   MRN: [id number]    DOB: [birth year]   Date:06/25/2018       Progress Note  Subjective  Chief Complaint  Chief Complaint  Patient presents with  . Annual Exam    HPI   Patient presents for annual CPE   Diet: tries to eat balanced Exercise: not very physically active, but will start moving 3 times a week   USPSTF grade A and B recommendations    [facility] Visit from 04/15/2018 in [facility]  AUDIT-C Score  1     Depression:  Depression screen [facility] 2/9 06/25/2018 04/15/2018 11/27/2017 07/02/2017 03/06/2017  Decreased Interest 0 0 0 1 0  Down, Depressed, Hopeless 0 0 0 0 0  PHQ - 2 Score 0 0 0 1 0  Altered sleeping _0 - -  Tired, decreased energy _1 - -  Change in appetite 1 0 1 - -  Feeling bad or failure about yourself  0 0 0 - -  Trouble concentrating 0 0 0 - -  Moving slowly or fidgety/restless 0 0 0 - -  Suicidal thoughts 0 0 0 - -  PHQ-9 Score _2 - -  Difficult doing work/chores Not difficult at all Not difficult at all Somewhat difficult - -  Some recent data might be hidden   Hypertension: BP Readings from Last 3 Encounters:  06/25/18 111/66  04/15/18 108/68  11/27/17 110/70   Obesity: Wt Readings from Last 3 Encounters:  06/25/18 138 lb 12.8 oz (63 kg)  04/15/18 137 lb (62.1 kg)  11/27/17 135 lb 8 oz (61.5 kg)   BMI Readings from Last 3 Encounters:  06/25/18 27.11 kg/m  04/15/18 26.76 kg/m  11/27/17 26.46 kg/m    Hep C Screening: up to date  STD testing and prevention (HIV/chl/gon/syphilis): N/A Intimate partner violence: negative screen  Sexual History/Pain during Intercourse: painful because of dryness, uses premarin and seems to help  Menstrual History/LMP/Abnormal Bleeding: history of endometriosis, s/p hysterectomy  Incontinence Symptoms: mild incontinence, needs to wear a liner - discussed referral to PT and she will check around  [place]: A voluntary discussion about advance care  planning including the explanation and discussion of advance directives.  Discussed health care proxy and Living will, and the patient was able to identify a health care proxy as husband   Patient does not have a living will at present time. If patient does have living will, I have requested they bring this to the clinic to be scanned in to their chart.  Breast cancer: today  BRCA gene screening: she was previously checked  Cervical cancer screening: not a candidate   Osteoporosis Screening: advised her to call and scheduled  Lipids:  Lab Results  Component Value Date   CHOL 243 (H) 11/27/2017   CHOL 230 (H) 11/21/2016   CHOL 226 (H) 03/31/2016   Lab Results  Component Value Date   HDL 53 11/27/2017   HDL 52 11/21/2016   HDL 50 03/31/2016   Lab Results  Component Value Date   LDLCALC 157 (H) 11/27/2017   LDLCALC 150 (H) 11/21/2016   LDLCALC 142 (H) 03/31/2016   Lab Results  Component Value Date   TRIG 179 (H) 11/27/2017   TRIG 138 11/21/2016   TRIG 168 (H) 03/31/2016   Lab Results  Component Value Date   CHOLHDL 4.6 11/27/2017   CHOLHDL 4.4 11/21/2016   CHOLHDL 4.5 (H) 03/31/2016   Lab  Results  Component Value Date   LDLDIRECT 170.9 06/18/2009   LDLDIRECT 155.0 08/24/2008   LDLDIRECT 143.1 08/20/2007    Glucose:  Glucose  Date Value Ref Range Status  11/21/2016 101 (H) 65 - 99 mg/dL Final  03/31/2016 88 65 - 99 mg/dL Final  03/08/2015 92 65 - 99 mg/dL Final   Glucose, Bld  Date Value Ref Range Status  11/27/2017 91 65 - 139 mg/dL Final    Comment:    .        Non-fasting reference interval .   06/18/2009 83 70 - 99 mg/dL Final  02/12/2009 98 70 - 99 mg/dL Final    Skin cancer: discussed atypical lesions  Colorectal cancer: she needs to re-schedule   Lung cancer:  Low Dose CT Chest recommended if Age 62-80 years, 30 pack-year currently smoking OR have quit w/in 63years. Patient does qualify.   ECG: up to date   Patient Active Problem List    Diagnosis Date Noted  . Localized, primary osteoarthritis of hand 06/25/2018  . Fever blister 08/17/2015  . Breast cyst 07/06/2015  . GAD (generalized anxiety disorder) 03/05/2015  . CAD in native artery 03/05/2015  . Hyperlipidemia 03/05/2015  . H/O total hysterectomy 11/30/2014  . History of basal cell cancer 11/30/2014  . Insomnia, persistent 11/30/2014  . Calcification of abdominal aorta (HCC) 11/16/2009  . Vitamin D deficiency 03/30/2009  . Major depression, recurrent, chronic ([address]) 05/13/2008  . Osteoporosis 08/20/2007  . Hypothyroidism, adult 04/30/2007  . ADD 04/30/2007    Past Surgical History:  Procedure Laterality Date  . ABDOMINAL HYSTERECTOMY    . APPENDECTOMY    . BREAST BIOPSY Bilateral    FNA- neg  . BREAST EXCISIONAL BIOPSY Left    benign  . CHOLECYSTECTOMY  1999  . TOTAL ABDOMINAL HYSTERECTOMY W/ BILATERAL SALPINGOOPHORECTOMY  1987    Family History  Problem Relation Age of Onset  . Cancer Mother 36       Breast Cancer  . Breast cancer Mother 64  . Diabetes Father   . Hypertension Father   . Heart attack Father   . Heart attack Maternal Grandmother   . CAD Maternal Grandmother   . CAD Maternal Uncle   . Stroke Paternal Grandmother   . Cancer Sister        Breast Cancer  . Diabetes Sister   . Depression Sister   . Breast cancer Sister 42  . Allergic rhinitis Son   . Allergic rhinitis Son   . Gallbladder disease Maternal Grandfather   . Gallstones Maternal Grandfather   . Alzheimer's disease Maternal Grandfather     Social History   Socioeconomic History  . Marital status: Married    Spouse name: Louie Casa  . Number of children: 2  . Years of education: Not on file  . Highest education level: High school graduate  Occupational History  . Not on file  Social Needs  . Financial resource strain: Not hard at all  . Food insecurity:    Worry: Never true    Inability: Never true  . Transportation needs:    Medical: No    Non-medical: No   Tobacco Use  . Smoking status: Former Smoker    Packs/day: 1.00    Years: 30.00    Pack years: 30.00    Types: Cigarettes    Start date: 03/05/1983    Last attempt to quit: 06/12/2012    Years since quitting: 6.0  . Smokeless tobacco: Never Used  .  Tobacco comment: 1 ppd for 30 years   Substance and Sexual Activity  . Alcohol use: Never    Alcohol/week: 0.0 standard drinks    Frequency: Never  . Drug use: No  . Sexual activity: Yes    Partners: Male    Birth control/protection: Surgical    Comment: Hysterectomy  Lifestyle  . Physical activity:    Days per week: 2 days    Minutes per session: 30 min  . Stress: Only a little  Relationships  . Social connections:    Talks on phone: More than three times a week    Gets together: Once a week    Attends religious service: More than 4 times per year    Active member of club or organization: Yes    Attends meetings of clubs or organizations: More than 4 times per year    Relationship status: Married  . Intimate partner violence:    Fear of current or ex partner: No    Emotionally abused: No    Physically abused: No    Forced sexual activity: No  Other Topics Concern  . Not on file  Social History Narrative   Married, 2 grown children one lives in [place] and one in [place]   She is working remotely now - from home in [place], [place]     Current Outpatient Medications:  .  alendronate (FOSAMAX) 70 MG tablet, Take 1 tablet (70 mg total) by mouth every 7 (seven) days. Take with a full glass of water on an empty stomach., Disp: 12 tablet, Rfl: 3 .  aspirin EC 81 MG tablet, Take 1 tablet (81 mg total) by mouth daily., Disp: 30 tablet, Rfl: 5 .  DULoxetine (CYMBALTA) 60 MG capsule, TAKE 1 CAPSULE(60 MG) BY MOUTH DAILY, Disp: 90 capsule, Rfl: 1 .  gabapentin (NEURONTIN) 300 MG capsule, Take 1 capsule (300 mg total) by mouth at bedtime., Disp: 90 capsule, Rfl: 1 .  Krill Oil Omega-3 500 MG CAPS, Take 1 capsule by mouth daily., Disp: 30  capsule, Rfl: 0 .  nitroGLYCERIN (NITROSTAT) 0.4 MG SL tablet, Place 1 tablet (0.4 mg total) under the tongue every 5 (five) minutes as needed for chest pain., Disp: 20 tablet, Rfl: 2 .  PREMARIN vaginal cream, INSERT 1 APPLICATORFUL VAGINALLY 2 TIMES A WEEK, Disp: 30 g, Rfl: 10 .  rosuvastatin (CRESTOR) 10 MG tablet, TAKE 1 TABLET(10 MG) BY MOUTH DAILY, Disp: 90 tablet, Rfl: 1 .  thyroid (ARMOUR THYROID) 30 MG tablet, TAKE 1 TABLET BY MOUTH EVERY DAY EXCEPT ON SUNDAY TAKE 1 1/2 TABLETS (Patient taking differently: TAKE 1 TABLET BY MOUTH EVERY DAY EXCEPT ON SUNDAY TAKE 1 1/2 TABLETS  BRAND ONLY), Disp: 94 tablet, Rfl: 1 .  valACYclovir (VALTREX) 1000 MG tablet, Take 1,000 mg by mouth as needed. , Disp: , Rfl: 1 .  ALPRAZolam (XANAX) 0.5 MG tablet, Take 1 tablet (0.5 mg total) by mouth 2 (two) times daily as needed. for anxiety (Patient not taking: Reported on 04/15/2018), Disp: 10 tablet, Rfl: 0  Allergies  Allergen Reactions  . Aspirin   . Boniva [Ibandronic Acid]     Stomach pain, indigestion, diarrhea  . Bupropion Hcl     REACTION: depression  . Seroquel [Quetiapine Fumarate]     Dry mouth, dizziness  . Varenicline Tartrate     REACTION: depression     ROS  Constitutional: Negative for fever or weight change.  Respiratory: positive for cough ( recovering from a recent URI) but no  shortness of breath.   Cardiovascular: Negative for chest pain or palpitations.  Gastrointestinal: Negative for abdominal pain, no bowel changes.  Musculoskeletal: Negative for gait problem , positive for left MCP  joint swelling.  Skin: Negative for rash.  Neurological: Negative for dizziness or headache.  No other specific complaints in a complete review of systems (except as listed in HPI above).  Objective  Vitals:   06/25/18 0828  BP: 111/66  Pulse: 95  Resp: 16  Temp: 98.1 F (36.7 C)  TempSrc: Oral  SpO2: 95%  Weight: 138 lb 12.8 oz (63 kg)  Height: 5' (1.524 m)    Body mass index is  27.11 kg/m.  Physical Exam  Constitutional: Patient appears well-developed and well-nourished. No distress.  HENT: Head: Normocephalic and atraumatic. Ears: B TMs ok, no erythema or effusion; Nose: Nose normal. Mouth/Throat: Oropharynx is clear and moist. No oropharyngeal exudate.  Eyes: Conjunctivae and EOM are normal. Pupils are equal, round, and reactive to light. No scleral icterus.  Neck: Normal range of motion. Neck supple. No JVD present. No thyromegaly present.  Cardiovascular: Normal rate, regular rhythm and normal heart sounds.  No murmur heard. No BLE edema. Pulmonary/Chest: Effort normal and breath sounds normal. No respiratory distress. Abdominal: Soft. Bowel sounds are normal, no distension. There is no tenderness. no masses Breast: no lumps or masses, no nipple discharge or rashes FEMALE GENITALIA:  External genitalia normal External urethra normal Pelvic not done  RECTAL: not done  Musculoskeletal: Normal range of motion, no joint effusions. No gross deformities Neurological: he is alert and oriented to person, place, and time. No cranial nerve deficit. Coordination, balance, strength, speech and gait are normal.  Skin: Skin is warm and dry. Very dry skin, worse on legs Psychiatric: Patient has a normal mood and affect. behavior is normal. Judgment and thought content normal.  PHQ2/9: Depression screen [facility] 2/9 06/25/2018 04/15/2018 11/27/2017 07/02/2017 03/06/2017  Decreased Interest 0 0 0 1 0  Down, Depressed, Hopeless 0 0 0 0 0  PHQ - 2 Score 0 0 0 1 0  Altered sleeping _0 - -  Tired, decreased energy _1 - -  Change in appetite 1 0 1 - -  Feeling bad or failure about yourself  0 0 0 - -  Trouble concentrating 0 0 0 - -  Moving slowly or fidgety/restless 0 0 0 - -  Suicidal thoughts 0 0 0 - -  PHQ-9 Score _2 - -  Difficult doing work/chores Not difficult at all Not difficult at all Somewhat difficult - -  Some recent data might be hidden     Fall  Risk: Fall Risk  06/25/2018 04/15/2018 11/27/2017 07/02/2017 03/06/2017  Falls in the past year? 0 0 No No No     Functional Status Survey: Is the patient deaf or have difficulty hearing?: No Does the patient have difficulty seeing, even when wearing glasses/contacts?: Yes(glasses) Does the patient have difficulty concentrating, remembering, or making decisions?: No Does the patient have difficulty walking or climbing stairs?: No Does the patient have difficulty dressing or bathing?: No Does the patient have difficulty doing errands alone such as visiting a doctor's office or shopping?: No   Assessment & Plan  1. Well woman exam  - DG Bone Density; Future  2. Colon cancer screening  She will re-schedule it  3. Age-related osteoporosis without current pathological fracture  - DG Bone Density; Future  4. History of tobacco use  - CT  CHEST LUNG CA SCREEN LOW DOSE W/O CM; Future  -USPSTF grade A and B recommendations reviewed with patient; age-appropriate recommendations, preventive care, screening tests, etc discussed and encouraged; healthy living encouraged; see AVS for patient education given to patient -Discussed importance of 150 minutes of physical activity weekly, eat two servings of fish weekly, eat one serving of tree nuts ( cashews, pistachios, pecans, almonds.Marland Kitchen) every other day, eat 6 servings of fruit/vegetables daily and drink plenty of water and avoid sweet beverages.

## 2018-06-25 NOTE — Telephone Encounter (Signed)
Patient called to inquire if we could sent in a prescription for her thyroid medication as brand only. It makes her feel better than the generic.

## 2018-06-26 ENCOUNTER — Telehealth: Payer: Self-pay | Admitting: *Deleted

## 2018-06-26 ENCOUNTER — Other Ambulatory Visit: Payer: Self-pay | Admitting: Family Medicine

## 2018-06-26 DIAGNOSIS — Z122 Encounter for screening for malignant neoplasm of respiratory organs: Secondary | ICD-10-CM

## 2018-06-26 DIAGNOSIS — Z87891 Personal history of nicotine dependence: Secondary | ICD-10-CM

## 2018-06-26 DIAGNOSIS — N6310 Unspecified lump in the right breast, unspecified quadrant: Secondary | ICD-10-CM

## 2018-06-26 NOTE — Telephone Encounter (Signed)
Received referral for low dose lung cancer screening CT scan. Message left at phone number listed in EMR for patient to call me back to facilitate scheduling scan.  

## 2018-06-26 NOTE — Telephone Encounter (Signed)
Received referral for initial lung cancer screening scan. Contacted patient and obtained smoking history,(former, quit 06/12/12, 30 pack year) as well as answering questions related to screening process. Patient denies signs of lung cancer such as weight loss or hemoptysis. Patient denies comorbidity that would prevent curative treatment if lung cancer were found. Patient is scheduled for shared decision making visit and CT scan on 07/18/18 at 845am.

## 2018-07-03 DIAGNOSIS — M7742 Metatarsalgia, left foot: Secondary | ICD-10-CM | POA: Diagnosis not present

## 2018-07-09 DIAGNOSIS — Z87891 Personal history of nicotine dependence: Secondary | ICD-10-CM | POA: Diagnosis not present

## 2018-07-09 DIAGNOSIS — Z886 Allergy status to analgesic agent status: Secondary | ICD-10-CM | POA: Diagnosis not present

## 2018-07-09 DIAGNOSIS — F329 Major depressive disorder, single episode, unspecified: Secondary | ICD-10-CM | POA: Diagnosis not present

## 2018-07-09 DIAGNOSIS — Z9049 Acquired absence of other specified parts of digestive tract: Secondary | ICD-10-CM | POA: Diagnosis not present

## 2018-07-09 DIAGNOSIS — E7800 Pure hypercholesterolemia, unspecified: Secondary | ICD-10-CM | POA: Diagnosis not present

## 2018-07-09 DIAGNOSIS — M1812 Unilateral primary osteoarthritis of first carpometacarpal joint, left hand: Secondary | ICD-10-CM | POA: Diagnosis not present

## 2018-07-09 DIAGNOSIS — M79645 Pain in left finger(s): Secondary | ICD-10-CM | POA: Diagnosis not present

## 2018-07-09 DIAGNOSIS — F419 Anxiety disorder, unspecified: Secondary | ICD-10-CM | POA: Diagnosis not present

## 2018-07-09 DIAGNOSIS — E039 Hypothyroidism, unspecified: Secondary | ICD-10-CM | POA: Diagnosis not present

## 2018-07-09 DIAGNOSIS — Z85828 Personal history of other malignant neoplasm of skin: Secondary | ICD-10-CM | POA: Diagnosis not present

## 2018-07-09 DIAGNOSIS — M810 Age-related osteoporosis without current pathological fracture: Secondary | ICD-10-CM | POA: Diagnosis not present

## 2018-07-09 DIAGNOSIS — I5189 Other ill-defined heart diseases: Secondary | ICD-10-CM | POA: Diagnosis not present

## 2018-07-09 DIAGNOSIS — Z90710 Acquired absence of both cervix and uterus: Secondary | ICD-10-CM | POA: Diagnosis not present

## 2018-07-09 DIAGNOSIS — Z79899 Other long term (current) drug therapy: Secondary | ICD-10-CM | POA: Diagnosis not present

## 2018-07-09 DIAGNOSIS — G2581 Restless legs syndrome: Secondary | ICD-10-CM | POA: Diagnosis not present

## 2018-07-09 DIAGNOSIS — Z791 Long term (current) use of non-steroidal anti-inflammatories (NSAID): Secondary | ICD-10-CM | POA: Diagnosis not present

## 2018-07-12 DIAGNOSIS — G2581 Restless legs syndrome: Secondary | ICD-10-CM | POA: Diagnosis not present

## 2018-07-12 DIAGNOSIS — F329 Major depressive disorder, single episode, unspecified: Secondary | ICD-10-CM | POA: Diagnosis not present

## 2018-07-12 DIAGNOSIS — M1812 Unilateral primary osteoarthritis of first carpometacarpal joint, left hand: Secondary | ICD-10-CM | POA: Diagnosis not present

## 2018-07-12 DIAGNOSIS — Z87891 Personal history of nicotine dependence: Secondary | ICD-10-CM | POA: Diagnosis not present

## 2018-07-12 DIAGNOSIS — Z79899 Other long term (current) drug therapy: Secondary | ICD-10-CM | POA: Diagnosis not present

## 2018-07-12 DIAGNOSIS — F419 Anxiety disorder, unspecified: Secondary | ICD-10-CM | POA: Diagnosis not present

## 2018-07-12 DIAGNOSIS — I5189 Other ill-defined heart diseases: Secondary | ICD-10-CM | POA: Diagnosis not present

## 2018-07-12 DIAGNOSIS — Z90710 Acquired absence of both cervix and uterus: Secondary | ICD-10-CM | POA: Diagnosis not present

## 2018-07-12 DIAGNOSIS — E7800 Pure hypercholesterolemia, unspecified: Secondary | ICD-10-CM | POA: Diagnosis not present

## 2018-07-12 DIAGNOSIS — Z85828 Personal history of other malignant neoplasm of skin: Secondary | ICD-10-CM | POA: Diagnosis not present

## 2018-07-12 DIAGNOSIS — M810 Age-related osteoporosis without current pathological fracture: Secondary | ICD-10-CM | POA: Diagnosis not present

## 2018-07-12 DIAGNOSIS — M79645 Pain in left finger(s): Secondary | ICD-10-CM | POA: Diagnosis not present

## 2018-07-12 DIAGNOSIS — Z886 Allergy status to analgesic agent status: Secondary | ICD-10-CM | POA: Diagnosis not present

## 2018-07-12 DIAGNOSIS — E039 Hypothyroidism, unspecified: Secondary | ICD-10-CM | POA: Diagnosis not present

## 2018-07-12 DIAGNOSIS — Z9049 Acquired absence of other specified parts of digestive tract: Secondary | ICD-10-CM | POA: Diagnosis not present

## 2018-07-12 DIAGNOSIS — Z791 Long term (current) use of non-steroidal anti-inflammatories (NSAID): Secondary | ICD-10-CM | POA: Diagnosis not present

## 2018-07-17 ENCOUNTER — Telehealth: Payer: Self-pay | Admitting: *Deleted

## 2018-07-17 NOTE — Telephone Encounter (Signed)
Called pt to remind her of her appt for ldct screening on 07-18-2018 @0845 , voiced understanding.

## 2018-07-18 ENCOUNTER — Inpatient Hospital Stay: Payer: BLUE CROSS/BLUE SHIELD | Attending: Nurse Practitioner | Admitting: Nurse Practitioner

## 2018-07-18 ENCOUNTER — Ambulatory Visit
Admission: RE | Admit: 2018-07-18 | Discharge: 2018-07-18 | Disposition: A | Discharge status: Home / Self Care | Payer: BLUE CROSS/BLUE SHIELD | Source: Ambulatory Visit | Attending: Nurse Practitioner | Admitting: Nurse Practitioner

## 2018-07-18 ENCOUNTER — Encounter: Payer: Self-pay | Admitting: Family Medicine

## 2018-07-18 DIAGNOSIS — D2261 Melanocytic nevi of right upper limb, including shoulder: Secondary | ICD-10-CM | POA: Diagnosis not present

## 2018-07-18 DIAGNOSIS — Z122 Encounter for screening for malignant neoplasm of respiratory organs: Secondary | ICD-10-CM | POA: Insufficient documentation

## 2018-07-18 DIAGNOSIS — D2272 Melanocytic nevi of left lower limb, including hip: Secondary | ICD-10-CM | POA: Diagnosis not present

## 2018-07-18 DIAGNOSIS — Z85828 Personal history of other malignant neoplasm of skin: Secondary | ICD-10-CM | POA: Diagnosis not present

## 2018-07-18 DIAGNOSIS — Z87891 Personal history of nicotine dependence: Secondary | ICD-10-CM

## 2018-07-18 DIAGNOSIS — I700 Atherosclerosis of aorta: Secondary | ICD-10-CM | POA: Insufficient documentation

## 2018-07-18 DIAGNOSIS — R911 Solitary pulmonary nodule: Secondary | ICD-10-CM | POA: Insufficient documentation

## 2018-07-18 DIAGNOSIS — L570 Actinic keratosis: Secondary | ICD-10-CM | POA: Diagnosis not present

## 2018-07-18 DIAGNOSIS — X32XXXA Exposure to sunlight, initial encounter: Secondary | ICD-10-CM | POA: Diagnosis not present

## 2018-07-18 DIAGNOSIS — D2262 Melanocytic nevi of left upper limb, including shoulder: Secondary | ICD-10-CM | POA: Diagnosis not present

## 2018-07-18 NOTE — Progress Notes (Signed)
In accordance with CMS guidelines, patient has met eligibility criteria including age, absence of signs or symptoms of lung cancer.  Social History   Tobacco Use  . Smoking status: Former Smoker    Packs/day: 1.00    Years: 30.00    Pack years: 30.00    Types: Cigarettes    Start date: 03/05/1983    Last attempt to quit: 06/12/2012    Years since quitting: 6.1  . Smokeless tobacco: Never Used  . Tobacco comment: 1 ppd for 30 years   Substance Use Topics  . Alcohol use: Never    Alcohol/week: 0.0 standard drinks    Frequency: Never  . Drug use: No      A shared decision-making session was conducted prior to the performance of CT scan. This includes one or more decision aids, includes benefits and harms of screening, follow-up diagnostic testing, over-diagnosis, false positive rate, and total radiation exposure.   Counseling on the importance of adherence to annual lung cancer LDCT screening, impact of co-morbidities, and ability or willingness to undergo diagnosis and treatment is imperative for compliance of the program.   Counseling on the importance of continued smoking cessation for former smokers; the importance of smoking cessation for current smokers, and information about tobacco cessation interventions have been given to patient including [place] and [facility] quit [place] programs.   Written order for lung cancer screening with LDCT has been given to the patient and any and all questions have been answered to the best of my abilities.    Yearly follow up will be coordinated by Burgess Estelle, Thoracic Navigator.  Beckey Rutter, DNP, AGNP-C [place] [facility] [phone] (work cell) [phone] (office) 07/18/18 9:22 AM

## 2018-07-19 ENCOUNTER — Encounter: Payer: Self-pay | Admitting: *Deleted

## 2018-07-19 DIAGNOSIS — M79642 Pain in left hand: Secondary | ICD-10-CM | POA: Diagnosis not present

## 2018-07-19 DIAGNOSIS — M1812 Unilateral primary osteoarthritis of first carpometacarpal joint, left hand: Secondary | ICD-10-CM | POA: Diagnosis not present

## 2018-07-22 DIAGNOSIS — M1812 Unilateral primary osteoarthritis of first carpometacarpal joint, left hand: Secondary | ICD-10-CM | POA: Diagnosis not present

## 2018-07-23 DIAGNOSIS — M79642 Pain in left hand: Secondary | ICD-10-CM | POA: Diagnosis not present

## 2018-07-23 DIAGNOSIS — M1812 Unilateral primary osteoarthritis of first carpometacarpal joint, left hand: Secondary | ICD-10-CM | POA: Diagnosis not present

## 2018-07-25 DIAGNOSIS — M1812 Unilateral primary osteoarthritis of first carpometacarpal joint, left hand: Secondary | ICD-10-CM | POA: Diagnosis not present

## 2018-07-25 DIAGNOSIS — M79642 Pain in left hand: Secondary | ICD-10-CM | POA: Diagnosis not present

## 2018-07-29 ENCOUNTER — Ambulatory Visit: Payer: BLUE CROSS/BLUE SHIELD | Admitting: Family Medicine

## 2018-08-02 DIAGNOSIS — M1812 Unilateral primary osteoarthritis of first carpometacarpal joint, left hand: Secondary | ICD-10-CM | POA: Diagnosis not present

## 2018-08-02 DIAGNOSIS — M79642 Pain in left hand: Secondary | ICD-10-CM | POA: Diagnosis not present

## 2018-08-03 ENCOUNTER — Encounter: Payer: Self-pay | Admitting: *Deleted

## 2018-08-07 DIAGNOSIS — M79642 Pain in left hand: Secondary | ICD-10-CM | POA: Diagnosis not present

## 2018-08-07 DIAGNOSIS — M1812 Unilateral primary osteoarthritis of first carpometacarpal joint, left hand: Secondary | ICD-10-CM | POA: Diagnosis not present

## 2018-08-15 DIAGNOSIS — M79642 Pain in left hand: Secondary | ICD-10-CM | POA: Diagnosis not present

## 2018-08-15 DIAGNOSIS — M1812 Unilateral primary osteoarthritis of first carpometacarpal joint, left hand: Secondary | ICD-10-CM | POA: Diagnosis not present

## 2018-08-19 DIAGNOSIS — M1812 Unilateral primary osteoarthritis of first carpometacarpal joint, left hand: Secondary | ICD-10-CM | POA: Diagnosis not present

## 2018-09-02 ENCOUNTER — Ambulatory Visit: Payer: BLUE CROSS/BLUE SHIELD | Admitting: Family Medicine

## 2018-09-30 ENCOUNTER — Other Ambulatory Visit: Payer: Self-pay | Admitting: Family Medicine

## 2018-09-30 DIAGNOSIS — G2581 Restless legs syndrome: Secondary | ICD-10-CM

## 2018-09-30 DIAGNOSIS — M1812 Unilateral primary osteoarthritis of first carpometacarpal joint, left hand: Secondary | ICD-10-CM | POA: Diagnosis not present

## 2018-09-30 DIAGNOSIS — G4700 Insomnia, unspecified: Secondary | ICD-10-CM

## 2018-10-01 NOTE — Telephone Encounter (Signed)
Refill request for general medication: Gabapentin 300 mg  Last office visit: 06/25/2018  Follow-ups on file. 10/14/2018

## 2018-10-14 ENCOUNTER — Other Ambulatory Visit: Payer: Self-pay

## 2018-10-14 ENCOUNTER — Encounter: Payer: Self-pay | Admitting: Family Medicine

## 2018-10-14 ENCOUNTER — Ambulatory Visit: Payer: BLUE CROSS/BLUE SHIELD | Admitting: Family Medicine

## 2018-10-14 VITALS — BP 126/72 | HR 91 | Temp 98.1°F | Resp 16 | Ht 60.0 in | Wt 149.1 lb

## 2018-10-14 DIAGNOSIS — F411 Generalized anxiety disorder: Secondary | ICD-10-CM

## 2018-10-14 DIAGNOSIS — E538 Deficiency of other specified B group vitamins: Secondary | ICD-10-CM

## 2018-10-14 DIAGNOSIS — E782 Mixed hyperlipidemia: Secondary | ICD-10-CM

## 2018-10-14 DIAGNOSIS — F339 Major depressive disorder, recurrent, unspecified: Secondary | ICD-10-CM

## 2018-10-14 DIAGNOSIS — I700 Atherosclerosis of aorta: Secondary | ICD-10-CM

## 2018-10-14 DIAGNOSIS — G4700 Insomnia, unspecified: Secondary | ICD-10-CM

## 2018-10-14 DIAGNOSIS — E039 Hypothyroidism, unspecified: Secondary | ICD-10-CM

## 2018-10-14 DIAGNOSIS — E559 Vitamin D deficiency, unspecified: Secondary | ICD-10-CM

## 2018-10-14 DIAGNOSIS — M810 Age-related osteoporosis without current pathological fracture: Secondary | ICD-10-CM | POA: Diagnosis not present

## 2018-10-14 DIAGNOSIS — I2510 Atherosclerotic heart disease of native coronary artery without angina pectoris: Secondary | ICD-10-CM

## 2018-10-14 MED ORDER — DULOXETINE HCL 60 MG PO CPEP: ORAL_CAPSULE | ORAL | 1 refills | Status: DC

## 2018-10-14 MED ORDER — ROSUVASTATIN CALCIUM 10 MG PO TABS: ORAL_TABLET | ORAL | 1 refills | Status: DC

## 2018-10-14 NOTE — Progress Notes (Signed)
Name: Denise Mason   MRN: [id number]    DOB: Sep 28, 1955   Date:10/14/2018       Progress Note  Subjective  Chief Complaint  Chief Complaint  Patient presents with  . Medication Refill  . Depression    HPI  Major Depression and GAD: she has had multiple episodesof depression, she  has tried and not tolerated or not responded to Prozac, Zoloft and Wellbutrin. She states some of the medication makes her very angry and is afraid to take them. We started her on Duloxetine back in September 2016 , she stated the first week she had nausea and headache, however those symptoms resolved. Now family life has improved, son is doing well and her husband finally recovered from [place]. However she has a new boss. He started end of January 2020 and seems not to like the older employees. She feels targeted and is affecting her emotionally. She has contacted HR but is still under a lot of stress. Phq 9 has gone up  Insomnia: . Shewason Temazepam22.5 mgand initially it worked for her, however not waking up refreshed anymore, and granddaughter stated that she snores, also husband has noticed snoring is louder and more frequent. ESSwe advised sleep study in the past because of high ESS but she decided not to go through with test. She has long history of RLS and since started on Gabapentin she started to sleep better, but now waking up at midnight or 1 am and has to eat - usually a protein cookie , advised to stop eating at night ( gained 11 lbs) either drink an herbal tea or broth only at night.   CAD and Precordial chest pain: she had a cath done in 2010 that showed mild LAD lesion. She was seen in June 2016 and had an episode of substernal chest pain, heavy sensation, that was triggered by stress. She was seen by Dr. Saralyn Pilar and had a negative echo stress test 2017. LAD lesion was stable, she is on Toprol XL, aspirin and Atorvastatin - but not very compliant with statin because of muscle aches. Last LDL was  elevated and we changed to Crestor 10 mg this past Summer, she is due for labs, ordered back in Nov reminded her the importance of having it done   Hypothyroidism: always has dry skin, weight has been stable, , no fatigue.Last TSH was at goal but is due for recheck TSH , gained a lot of weight since last visit.   GERD:off medication and is doing well.  Osteoporosis: she has been taking Fosamaxand states initially had diarrhea the day after but that has resolved now. Good compliance with medication  Patient Active Problem List   Diagnosis Date Noted  . Nodule of lower lobe of left lung 07/18/2018  . Atherosclerosis of aorta ([place]) 07/18/2018  . Localized, primary osteoarthritis of hand 06/25/2018  . Fever blister 08/17/2015  . Breast cyst 07/06/2015  . GAD (generalized anxiety disorder) 03/05/2015  . CAD in native artery 03/05/2015  . Hyperlipidemia 03/05/2015  . H/O total hysterectomy 11/30/2014  . History of basal cell cancer 11/30/2014  . Insomnia, persistent 11/30/2014  . Calcification of abdominal aorta (HCC) 11/16/2009  . Vitamin D deficiency 03/30/2009  . Major depression, recurrent, chronic ([place]) 05/13/2008  . Osteoporosis 08/20/2007  . Hypothyroidism, adult 04/30/2007  . ADD 04/30/2007    Past Surgical History:  Procedure Laterality Date  . ABDOMINAL HYSTERECTOMY    . APPENDECTOMY    . BREAST BIOPSY Bilateral  FNA- neg  . BREAST EXCISIONAL BIOPSY Left    benign  . CHOLECYSTECTOMY  1999  . TOTAL ABDOMINAL HYSTERECTOMY W/ BILATERAL SALPINGOOPHORECTOMY  1987    Family History  Problem Relation Age of Onset  . Cancer Mother 68       Breast Cancer  . Breast cancer Mother 26  . Diabetes Father   . Hypertension Father   . Heart attack Father   . Heart attack Maternal Grandmother   . CAD Maternal Grandmother   . CAD Maternal Uncle   . Stroke Paternal Grandmother   . Cancer Sister        Breast Cancer  . Diabetes Sister   . Depression Sister   . Breast  cancer Sister 58  . Allergic rhinitis Son   . Allergic rhinitis Son   . Gallbladder disease Maternal Grandfather   . Gallstones Maternal Grandfather   . Alzheimer's disease Maternal Grandfather     Social History   Socioeconomic History  . Marital status: Married    Spouse name: Louie Casa  . Number of children: 2  . Years of education: Not on file  . Highest education level: High school graduate  Occupational History  . Not on file  Social Needs  . Financial resource strain: Not hard at all  . Food insecurity:    Worry: Never true    Inability: Never true  . Transportation needs:    Medical: No    Non-medical: No  Tobacco Use  . Smoking status: Former Smoker    Packs/day: 1.00    Years: 30.00    Pack years: 30.00    Types: Cigarettes    Start date: 03/05/1983    Last attempt to quit: 06/12/2012    Years since quitting: 6.3  . Smokeless tobacco: Never Used  . Tobacco comment: 1 ppd for 30 years   Substance and Sexual Activity  . Alcohol use: Never    Alcohol/week: 0.0 standard drinks    Frequency: Never  . Drug use: No  . Sexual activity: Yes    Partners: Male    Birth control/protection: Surgical    Comment: Hysterectomy  Lifestyle  . Physical activity:    Days per week: 2 days    Minutes per session: 30 min  . Stress: Only a little  Relationships  . Social connections:    Talks on phone: More than three times a week    Gets together: Once a week    Attends religious service: More than 4 times per year    Active member of club or organization: Yes    Attends meetings of clubs or organizations: More than 4 times per year    Relationship status: Married  . Intimate partner violence:    Fear of current or ex partner: No    Emotionally abused: No    Physically abused: No    Forced sexual activity: No  Other Topics Concern  . Not on file  Social History Narrative   Married, 2 grown children one lives in [place] and one in [place]   She is working remotely now  - from home in [place], [place]     Current Outpatient Medications:  .  alendronate (FOSAMAX) 70 MG tablet, Take 1 tablet (70 mg total) by mouth every 7 (seven) days. Take with a full glass of water on an empty stomach., Disp: 12 tablet, Rfl: 3 .  ALPRAZolam (XANAX) 0.5 MG tablet, Take 1 tablet (0.5 mg total) by mouth 2 (two) times  daily as needed. for anxiety, Disp: 10 tablet, Rfl: 0 .  ammonium lactate (LAC-HYDRIN) 12 % lotion, Apply 1 application topically as needed for dry skin., Disp: 400 g, Rfl: 0 .  ARMOUR THYROID 30 MG tablet, TAKE 1 TABLET BY MOUTH EVERY DAY EXCEPT ON SUNDAY TAKE 1 1/2 TABLETS, Disp: 94 tablet, Rfl: 1 .  aspirin EC 81 MG tablet, Take 1 tablet (81 mg total) by mouth daily., Disp: 30 tablet, Rfl: 5 .  DULoxetine (CYMBALTA) 60 MG capsule, TAKE 1 CAPSULE(60 MG) BY MOUTH DAILY, Disp: 90 capsule, Rfl: 1 .  gabapentin (NEURONTIN) 300 MG capsule, TAKE 1 CAPSULE(300 MG) BY MOUTH AT BEDTIME, Disp: 90 capsule, Rfl: 1 .  Krill Oil Omega-3 500 MG CAPS, Take 1 capsule by mouth daily., Disp: 30 capsule, Rfl: 0 .  nitroGLYCERIN (NITROSTAT) 0.4 MG SL tablet, Place 1 tablet (0.4 mg total) under the tongue every 5 (five) minutes as needed for chest pain., Disp: 20 tablet, Rfl: 2 .  PREMARIN vaginal cream, INSERT 1 APPLICATORFUL VAGINALLY 2 TIMES A WEEK, Disp: 30 g, Rfl: 10 .  rosuvastatin (CRESTOR) 10 MG tablet, TAKE 1 TABLET(10 MG) BY MOUTH DAILY, Disp: 90 tablet, Rfl: 1 .  valACYclovir (VALTREX) 1000 MG tablet, Take 1,000 mg by mouth as needed. , Disp: , Rfl: 1  Allergies  Allergen Reactions  . Aspirin   . Boniva [Ibandronic Acid]     Stomach pain, indigestion, diarrhea  . Bupropion Hcl     REACTION: depression  . Seroquel [Quetiapine Fumarate]     Dry mouth, dizziness  . Varenicline Tartrate     REACTION: depression    I personally reviewed active problem list, medication list, allergies, family history, social history with the patient/caregiver today.   ROS  Constitutional:  Negative for fever, positive for  weight change.  Respiratory: Negative for cough and shortness of breath.   Cardiovascular: Negative for chest pain or palpitations.  Gastrointestinal: Negative for abdominal pain, no bowel changes.  Musculoskeletal: Negative for gait problem or joint swelling.  Skin: Negative for rash.  Neurological: Negative for dizziness or headache.  No other specific complaints in a complete review of systems (except as listed in HPI above).  Objective  Vitals:   10/14/18 1527  BP: 126/72  Pulse: 91  Resp: 16  Temp: 98.1 F (36.7 C)  TempSrc: Oral  SpO2: 95%  Weight: 149 lb 1.6 oz (67.6 kg)  Height: 5' (1.524 m)    Body mass index is 29.12 kg/m.  Physical Exam  Constitutional: Patient appears well-developed and well-nourished. Overweight.  No distress.  HEENT: head atraumatic, normocephalic, pupils equal and reactive to light, neck supple, throat within normal limits Cardiovascular: Normal rate, regular rhythm and normal heart sounds.  No murmur heard. No BLE edema. Pulmonary/Chest: Effort normal and breath sounds normal. No respiratory distress. Abdominal: Soft.  There is no tenderness. Psychiatric: Patient has a normal mood and affect. behavior is normal. Judgment and thought content normal.  PHQ2/9: Depression screen [facility] 2/9 10/14/2018 06/25/2018 04/15/2018 11/27/2017 07/02/2017  Decreased Interest 1 0 0 0 1  Down, Depressed, Hopeless 0 0 0 0 0  PHQ - 2 Score 1 0 0 0 1  Altered sleeping 3 2 3 3  -  Tired, decreased energy 1 1 3 1  -  Change in appetite 2 1 0 1 -  Feeling bad or failure about yourself  1 0 0 0 -  Trouble concentrating 0 0 0 0 -  Moving slowly or fidgety/restless 0 0  0 0 -  Suicidal thoughts 0 0 0 0 -  PHQ-9 Score 8 4 6 5  -  Difficult doing work/chores Somewhat difficult Not difficult at all Not difficult at all Somewhat difficult -  Some recent data might be hidden    phq 9 is positive   Fall Risk: Fall Risk  10/14/2018 06/25/2018  04/15/2018 11/27/2017 07/02/2017  Falls in the past year? 0 0 0 No No  Number falls in past yr: 0 - - - -  Injury with Fall? 0 - - - -    Functional Status Survey: Is the patient deaf or have difficulty hearing?: No Does the patient have difficulty seeing, even when wearing glasses/contacts?: No Does the patient have difficulty concentrating, remembering, or making decisions?: No Does the patient have difficulty walking or climbing stairs?: No Does the patient have difficulty dressing or bathing?: No Does the patient have difficulty doing errands alone such as visiting a doctor's office or shopping?: No   Assessment & Plan  1. Hypothyroidism, adult  - TSH  2. Age-related osteoporosis without current pathological fracture  Continue Alendronate   3. Major depression, recurrent, chronic (HCC)  - DULoxetine (CYMBALTA) 60 MG capsule; TAKE 1 CAPSULE(60 MG) BY MOUTH DAILY  Dispense: 90 capsule; Refill: 1  4. GAD (generalized anxiety disorder)  - DULoxetine (CYMBALTA) 60 MG capsule; TAKE 1 CAPSULE(60 MG) BY MOUTH DAILY  Dispense: 90 capsule; Refill: 1  5. Mixed hyperlipidemia  - rosuvastatin (CRESTOR) 10 MG tablet; TAKE 1 TABLET(10 MG) BY MOUTH DAILY  Dispense: 90 tablet; Refill: 1  6. B12 deficiency  Continue supplements   7. CAD in native artery   8. Vitamin D deficiency  Continue supplementation   9. Insomnia, persistent  Discussed mindfulness exercises   10. Atherosclerosis of aorta (HCC)  - rosuvastatin (CRESTOR) 10 MG tablet; TAKE 1 TABLET(10 MG) BY MOUTH DAILY  Dispense: 90 tablet; Refill: 1

## 2018-11-29 ENCOUNTER — Other Ambulatory Visit: Payer: Self-pay | Admitting: Family Medicine

## 2018-11-29 DIAGNOSIS — E039 Hypothyroidism, unspecified: Secondary | ICD-10-CM

## 2018-12-02 NOTE — Telephone Encounter (Signed)
Pt states she is out of town can you give a month supply?

## 2018-12-06 DIAGNOSIS — E039 Hypothyroidism, unspecified: Secondary | ICD-10-CM | POA: Diagnosis not present

## 2018-12-07 LAB — TSH: TSH: 2.42 u[IU]/mL (ref 0.450–4.500)

## 2018-12-08 ENCOUNTER — Other Ambulatory Visit: Payer: Self-pay | Admitting: Family Medicine

## 2018-12-08 DIAGNOSIS — E039 Hypothyroidism, unspecified: Secondary | ICD-10-CM

## 2018-12-08 MED ORDER — ARMOUR THYROID 30 MG PO TABS: ORAL_TABLET | ORAL | 0 refills | Status: DC

## 2018-12-25 ENCOUNTER — Other Ambulatory Visit: Payer: Self-pay

## 2018-12-25 ENCOUNTER — Ambulatory Visit
Admission: RE | Admit: 2018-12-25 | Discharge: 2018-12-25 | Disposition: A | Discharge status: Home / Self Care | Payer: BC Managed Care – PPO | Source: Ambulatory Visit | Attending: Family Medicine | Admitting: Family Medicine

## 2018-12-25 DIAGNOSIS — N6310 Unspecified lump in the right breast, unspecified quadrant: Secondary | ICD-10-CM | POA: Diagnosis not present

## 2018-12-25 DIAGNOSIS — N6312 Unspecified lump in the right breast, upper inner quadrant: Secondary | ICD-10-CM | POA: Diagnosis not present

## 2019-03-08 ENCOUNTER — Other Ambulatory Visit: Payer: Self-pay | Admitting: Family Medicine

## 2019-03-08 DIAGNOSIS — L853 Xerosis cutis: Secondary | ICD-10-CM

## 2019-03-23 ENCOUNTER — Other Ambulatory Visit: Payer: Self-pay | Admitting: Family Medicine

## 2019-03-23 DIAGNOSIS — E039 Hypothyroidism, unspecified: Secondary | ICD-10-CM

## 2019-03-23 DIAGNOSIS — G2581 Restless legs syndrome: Secondary | ICD-10-CM

## 2019-03-23 DIAGNOSIS — G4700 Insomnia, unspecified: Secondary | ICD-10-CM

## 2019-05-10 ENCOUNTER — Other Ambulatory Visit: Payer: Self-pay | Admitting: Family Medicine

## 2019-05-10 DIAGNOSIS — F411 Generalized anxiety disorder: Secondary | ICD-10-CM

## 2019-05-10 DIAGNOSIS — F339 Major depressive disorder, recurrent, unspecified: Secondary | ICD-10-CM

## 2019-05-21 ENCOUNTER — Other Ambulatory Visit: Payer: Self-pay | Admitting: Family Medicine

## 2019-05-21 ENCOUNTER — Encounter: Payer: Self-pay | Admitting: Family Medicine

## 2019-05-21 DIAGNOSIS — R928 Other abnormal and inconclusive findings on diagnostic imaging of breast: Secondary | ICD-10-CM

## 2019-06-09 ENCOUNTER — Other Ambulatory Visit: Payer: Self-pay | Admitting: Family Medicine

## 2019-06-09 DIAGNOSIS — E039 Hypothyroidism, unspecified: Secondary | ICD-10-CM

## 2019-06-27 ENCOUNTER — Other Ambulatory Visit: Payer: Self-pay

## 2019-06-27 ENCOUNTER — Ambulatory Visit
Admission: RE | Admit: 2019-06-27 | Discharge: 2019-06-27 | Disposition: A | Discharge status: Home / Self Care | Payer: BC Managed Care – PPO | Source: Ambulatory Visit | Attending: Family Medicine | Admitting: Family Medicine

## 2019-06-27 ENCOUNTER — Encounter: Payer: Self-pay | Admitting: Family Medicine

## 2019-06-27 ENCOUNTER — Ambulatory Visit (INDEPENDENT_AMBULATORY_CARE_PROVIDER_SITE_OTHER): Payer: BC Managed Care – PPO | Admitting: Family Medicine

## 2019-06-27 VITALS — BP 100/68 | HR 88 | Temp 97.3°F | Resp 16 | Ht 59.25 in | Wt 147.6 lb

## 2019-06-27 DIAGNOSIS — E039 Hypothyroidism, unspecified: Secondary | ICD-10-CM

## 2019-06-27 DIAGNOSIS — E538 Deficiency of other specified B group vitamins: Secondary | ICD-10-CM

## 2019-06-27 DIAGNOSIS — Z79899 Other long term (current) drug therapy: Secondary | ICD-10-CM | POA: Diagnosis not present

## 2019-06-27 DIAGNOSIS — L509 Urticaria, unspecified: Secondary | ICD-10-CM

## 2019-06-27 DIAGNOSIS — N6312 Unspecified lump in the right breast, upper inner quadrant: Secondary | ICD-10-CM | POA: Diagnosis not present

## 2019-06-27 DIAGNOSIS — R928 Other abnormal and inconclusive findings on diagnostic imaging of breast: Secondary | ICD-10-CM | POA: Insufficient documentation

## 2019-06-27 DIAGNOSIS — I2510 Atherosclerotic heart disease of native coronary artery without angina pectoris: Secondary | ICD-10-CM

## 2019-06-27 DIAGNOSIS — F339 Major depressive disorder, recurrent, unspecified: Secondary | ICD-10-CM

## 2019-06-27 DIAGNOSIS — Z131 Encounter for screening for diabetes mellitus: Secondary | ICD-10-CM

## 2019-06-27 DIAGNOSIS — M818 Other osteoporosis without current pathological fracture: Secondary | ICD-10-CM

## 2019-06-27 DIAGNOSIS — Z1211 Encounter for screening for malignant neoplasm of colon: Secondary | ICD-10-CM | POA: Diagnosis not present

## 2019-06-27 DIAGNOSIS — Z0000 Encounter for general adult medical examination without abnormal findings: Secondary | ICD-10-CM

## 2019-06-27 DIAGNOSIS — G2581 Restless legs syndrome: Secondary | ICD-10-CM

## 2019-06-27 DIAGNOSIS — E559 Vitamin D deficiency, unspecified: Secondary | ICD-10-CM

## 2019-06-27 DIAGNOSIS — I700 Atherosclerosis of aorta: Secondary | ICD-10-CM

## 2019-06-27 DIAGNOSIS — B001 Herpesviral vesicular dermatitis: Secondary | ICD-10-CM

## 2019-06-27 DIAGNOSIS — Z23 Encounter for immunization: Secondary | ICD-10-CM | POA: Diagnosis not present

## 2019-06-27 DIAGNOSIS — M810 Age-related osteoporosis without current pathological fracture: Secondary | ICD-10-CM

## 2019-06-27 DIAGNOSIS — N952 Postmenopausal atrophic vaginitis: Secondary | ICD-10-CM

## 2019-06-27 DIAGNOSIS — E782 Mixed hyperlipidemia: Secondary | ICD-10-CM | POA: Diagnosis not present

## 2019-06-27 DIAGNOSIS — F411 Generalized anxiety disorder: Secondary | ICD-10-CM

## 2019-06-27 DIAGNOSIS — G4700 Insomnia, unspecified: Secondary | ICD-10-CM

## 2019-06-27 MED ORDER — HYDROXYZINE HCL 10 MG PO TABS: 10.0000 mg | ORAL_TABLET | Freq: Three times a day (TID) | ORAL | 0 refills | Status: AC | PRN

## 2019-06-27 MED ORDER — ROSUVASTATIN CALCIUM 10 MG PO TABS: ORAL_TABLET | ORAL | 1 refills | Status: DC

## 2019-06-27 MED ORDER — PREMARIN 0.625 MG/GM VA CREA: TOPICAL_CREAM | VAGINAL | 10 refills | Status: AC

## 2019-06-27 MED ORDER — VALACYCLOVIR HCL 1 G PO TABS: 1000.0000 mg | ORAL_TABLET | Freq: Two times a day (BID) | ORAL | 0 refills | Status: AC

## 2019-06-27 MED ORDER — NITROGLYCERIN 0.4 MG SL SUBL: 0.4000 mg | SUBLINGUAL_TABLET | SUBLINGUAL | 2 refills | Status: AC | PRN

## 2019-06-27 MED ORDER — DULOXETINE HCL 60 MG PO CPEP: 60.0000 mg | ORAL_CAPSULE | Freq: Every day | ORAL | 1 refills | Status: DC

## 2019-06-27 MED ORDER — ALPRAZOLAM 0.5 MG PO TABS: 0.5000 mg | ORAL_TABLET | Freq: Two times a day (BID) | ORAL | 0 refills | Status: DC | PRN

## 2019-06-27 MED ORDER — ALENDRONATE SODIUM 70 MG PO TABS: 70.0000 mg | ORAL_TABLET | ORAL | 3 refills | Status: AC

## 2019-06-27 MED ORDER — GABAPENTIN 300 MG PO CAPS: 300.0000 mg | ORAL_CAPSULE | Freq: Every day | ORAL | 1 refills | Status: DC

## 2019-06-27 MED ORDER — ALPRAZOLAM 0.5 MG PO TABS: 0.5000 mg | ORAL_TABLET | Freq: Two times a day (BID) | ORAL | 0 refills | Status: AC | PRN

## 2019-06-27 NOTE — Patient Instructions (Signed)
Preventive Care 64 Years Old, Female Preventive care refers to visits with your health care provider and lifestyle choices that can promote health and wellness. This includes:  A yearly physical exam. This may also be called an annual well check.  Regular dental visits and eye exams.  Immunizations.  Screening for certain conditions.  Healthy lifestyle choices, such as eating a healthy diet, getting regular exercise, not using drugs or products that contain nicotine and tobacco, and limiting alcohol use. What can I expect for my preventive care visit? Physical exam Your health care provider will check your:  Height and weight. This may be used to calculate body mass index (BMI), which tells if you are at a healthy weight.  Heart rate and blood pressure.  Skin for abnormal spots. Counseling Your health care provider may ask you questions about your:  Alcohol, tobacco, and drug use.  Emotional well-being.  Home and relationship well-being.  Sexual activity.  Eating habits.  Work and work environment.  Method of birth control.  Menstrual cycle.  Pregnancy history. What immunizations do I need?  Influenza (flu) vaccine  This is recommended every year. Tetanus, diphtheria, and pertussis (Tdap) vaccine  You may need a Td booster every 10 years. Varicella (chickenpox) vaccine  You may need this if you have not been vaccinated. Zoster (shingles) vaccine  You may need this after age 63. Measles, mumps, and rubella (MMR) vaccine  You may need at least one dose of MMR if you were born in 1957 or later. You may also need a second dose. Pneumococcal conjugate (PCV13) vaccine  You may need this if you have certain conditions and were not previously vaccinated. Pneumococcal polysaccharide (PPSV23) vaccine  You may need one or two doses if you smoke cigarettes or if you have certain conditions. Meningococcal conjugate (MenACWY) vaccine  You may need this if you  have certain conditions. Hepatitis A vaccine  You may need this if you have certain conditions or if you travel or work in places where you may be exposed to hepatitis A. Hepatitis B vaccine  You may need this if you have certain conditions or if you travel or work in places where you may be exposed to hepatitis B. Haemophilus influenzae type b (Hib) vaccine  You may need this if you have certain conditions. Human papillomavirus (HPV) vaccine  If recommended by your health care provider, you may need three doses over 6 months. You may receive vaccines as individual doses or as more than one vaccine together in one shot (combination vaccines). Talk with your health care provider about the risks and benefits of combination vaccines. What tests do I need? Blood tests  Lipid and cholesterol levels. These may be checked every 5 years, or more frequently if you are over 50 years old.  Hepatitis C test.  Hepatitis B test. Screening  Lung cancer screening. You may have this screening every year starting at age 55 if you have a 30-pack-year history of smoking and currently smoke or have quit within the past 15 years.  Colorectal cancer screening. All adults should have this screening starting at age 50 and continuing until age 75. Your health care provider may recommend screening at age 45 if you are at increased risk. You will have tests every 1-10 years, depending on your results and the type of screening test.  Diabetes screening. This is done by checking your blood sugar (glucose) after you have not eaten for a while (fasting). You may have this   done every 1-3 years.  Mammogram. This may be done every 1-2 years. Talk with your health care provider about when you should start having regular mammograms. This may depend on whether you have a family history of breast cancer.  BRCA-related cancer screening. This may be done if you have a family history of breast, ovarian, tubal, or peritoneal  cancers.  Pelvic exam and Pap test. This may be done every 3 years starting at age 66. Starting at age 81, this may be done every 5 years if you have a Pap test in combination with an HPV test. Other tests  Sexually transmitted disease (STD) testing.  Bone density scan. This is done to screen for osteoporosis. You may have this scan if you are at high risk for osteoporosis. Follow these instructions at home: Eating and drinking  Eat a diet that includes fresh fruits and vegetables, whole grains, lean protein, and low-fat dairy.  Take vitamin and mineral supplements as recommended by your health care provider.  Do not drink alcohol if: ? Your health care provider tells you not to drink. ? You are pregnant, may be pregnant, or are planning to become pregnant.  If you drink alcohol: ? Limit how much you have to 0-1 drink a day. ? Be aware of how much alcohol is in your drink. In the [place] one drink equals one 12 oz bottle of beer (355 mL), one 5 oz glass of wine (148 mL), or one 1 oz glass of hard liquor (44 mL). Lifestyle  Take daily care of your teeth and gums.  Stay active. Exercise for at least 30 minutes on 5 or more days each week.  Do not use any products that contain nicotine or tobacco, such as cigarettes, e-cigarettes, and chewing tobacco. If you need help quitting, ask your health care provider.  If you are sexually active, practice safe sex. Use a condom or other form of birth control (contraception) in order to prevent pregnancy and STIs (sexually transmitted infections).  If told by your health care provider, take low-dose aspirin daily starting at age 62. What's next?  Visit your health care provider once a year for a well check visit.  Ask your health care provider how often you should have your eyes and teeth checked.  Stay up to date on all vaccines. This information is not intended to replace advice given to you by your health care provider. Make sure you  discuss any questions you have with your health care provider. Document Revised: 02/07/2018 Document Reviewed: 02/07/2018 Elsevier Patient Education  2020 [employer].

## 2019-06-27 NOTE — Progress Notes (Signed)
Name: Denise Mason   MRN: [id number]    DOB: Mar 22, 1956   Date:06/27/2019       Progress Note  Subjective  Chief Complaint  Chief Complaint  Patient presents with  . Annual Exam    HPI  Patient presents for annual CPE and follow up.  Major Depression and GAD: she has had multiple episodesof depression in the past ,shehas tried and not tolerated or not responded to Prozac, Zoloft and Wellbutrin. She states some of the medication makes her very angry and is afraid to take them. We started her on Duloxetine back in September 2016 , she stated the first week she had nausea and headache, however those symptoms resolved. Now family life has improved, son is doing well and her husband finally recovered from [place]. Stress at work has improved also, she has been working from home and that has helped. She states she is feeling more anxious about our country, also worries about COVID-19   Insomnia: . Shewason Temazepam22.5 mgand initially it worked for her, however not waking up refreshed anymore, and granddaughter stated that she snores, also husband has noticed snoring is louder and more frequent. ESSwe advised sleep study in the past because of high ESS but she decided not to go through with test.She has long history of RLS and since started on Gabapentin she started to sleep better, she is still waking up after 4 hours of sleep, sometimes it takes a while to fall back asleep, discussed love and kindness meditation.   CAD and Precordial chest pain: she had a cath done in 2010 that showed mild LAD lesion. She was seen in June 2016 and had an episode of substernal chest pain, heavy sensation, that was triggered by stress. She was seen by Dr. Saralyn Pilar and had a negative echo stress test 2017. LAD lesion was stable, she is on Toprol XL, aspirin and Atorvastatin - but not very compliant with statin because of muscle aches. Last LDL was elevated and we changed to Crestor since last visit she states  myalgia has improved, and she has been more compliant with medication since. We will check labs today   Hypothyroidism: always has dry skin, no fatigue.Last TSH was at [place] is due for recheck TSH , she has been gaining weight   GERD:off medication and is doing well.Unchanged   Osteoporosis: she has been taking Fosamaxand states initially had diarrhea the day after but that has resolved now. Good compliance with medication. Explained she needs to have bone density repeated, and we will check labs including vitamin D level   Hives: she has noticed some hives in the mornings when she gets out of her showed and sometimes at night. She is using lachytrin and seems to help.   Fever blister: she thinks stress made her have an outbreak on lower left lip this week, we will send Valtrex   Diet: she is considering a low carbohydrate diet , she would like to lose weight. Discussed fluid intake Exercise: not at this time, discussed getting up every hour   USPSTF grade A and B recommendations    Office Visit from 06/27/2019 in [facility] [place] [facility]  AUDIT-C Score  0     Depression: Phq 9 is  negative Depression screen [facility] 2/9 06/27/2019 10/14/2018 06/25/2018 04/15/2018 11/27/2017  Decreased Interest 1 1 0 0 0  Down, Depressed, Hopeless 0 0 0 0 0  PHQ - 2 Score 1 1 0 0 0  Altered sleeping 3 3 2  3 3  Tired, decreased energy 0 '1 1 3 1  ' Change in appetite '1 2 1 ' 0 1  Feeling bad or failure about yourself  1 1 0 0 0  Trouble concentrating 0 0 0 0 0  Moving slowly or fidgety/restless 0 0 0 0 0  Suicidal thoughts 0 0 0 0 0  PHQ-9 Score '6 8 4 6 5  ' Difficult doing work/chores Not difficult at all Somewhat difficult Not difficult at all Not difficult at all Somewhat difficult  Some recent data might be hidden   Hypertension: BP Readings from Last 3 Encounters:  06/27/19 100/68  10/14/18 126/72  06/25/18 111/66   Obesity: Wt Readings from Last 3 Encounters:  06/27/19 147 lb 9.6 oz  (67 kg)  10/14/18 149 lb 1.6 oz (67.6 kg)  07/18/18 132 lb (59.9 kg)   BMI Readings from Last 3 Encounters:  06/27/19 29.56 kg/m  10/14/18 29.12 kg/m  07/18/18 25.78 kg/m     Hep C Screening: negative 2016  STD testing and prevention (HIV/chl/gon/syphilis): not interested  Intimate partner violence:negative screen  Sexual History (Partners/Practices/Protection from [employer] hx STI/Pregnancy Plans): one partner, married, no history of STI Pain during Intercourse: sometimes because of dryness Menstrual History/LMP/Abnormal Bleeding:  Incontinence Symptoms:   Breast cancer:  - Last Mammogram: scheduled  - BRCA gene screening: checked in the past   Osteoporosis: Discussed high calcium and vitamin D supplementation, weight bearing exercises  Cervical cancer screening: N/A s/p hysterectomy for endometriosis   Skin cancer: Discussed monitoring for atypical lesions  Colorectal cancer: she is due for screen   Lung cancer:   Low Dose CT Chest recommended if Age 50-80 years, 30 pack-year currently smoking OR have quit w/in 15years. Patient does qualify.  She is in the program  ECG: 07/2016   Advanced Care Planning: A voluntary discussion about advance care planning including the explanation and discussion of advance directives.  Discussed health care proxy and Living will, and the patient was able to identify a health care proxy as husband .  Patient does not have a living will at present time.   Lipids: Lab Results  Component Value Date   CHOL 243 (H) 11/27/2017   CHOL 230 (H) 11/21/2016   CHOL 226 (H) 03/31/2016   Lab Results  Component Value Date   HDL 53 11/27/2017   HDL 52 11/21/2016   HDL 50 03/31/2016   Lab Results  Component Value Date   LDLCALC 157 (H) 11/27/2017   LDLCALC 150 (H) 11/21/2016   LDLCALC 142 (H) 03/31/2016   Lab Results  Component Value Date   TRIG 179 (H) 11/27/2017   TRIG 138 11/21/2016   TRIG 168 (H) 03/31/2016   Lab Results  Component  Value Date   CHOLHDL 4.6 11/27/2017   CHOLHDL 4.4 11/21/2016   CHOLHDL 4.5 (H) 03/31/2016   Lab Results  Component Value Date   LDLDIRECT 170.9 06/18/2009   LDLDIRECT 155.0 08/24/2008   LDLDIRECT 143.1 08/20/2007    Glucose: Glucose  Date Value Ref Range Status  11/21/2016 101 (H) 65 - 99 mg/dL Final  03/31/2016 88 65 - 99 mg/dL Final  03/08/2015 92 65 - 99 mg/dL Final   Glucose, Bld  Date Value Ref Range Status  11/27/2017 91 65 - 139 mg/dL Final    Comment:    .        Non-fasting reference interval .   06/18/2009 83 70 - 99 mg/dL Final  02/12/2009 98 70 - 99 mg/dL Final  Patient Active Problem List   Diagnosis Date Noted  . Nodule of lower lobe of left lung 07/18/2018  . Atherosclerosis of aorta ([place]) 07/18/2018  . Localized, primary osteoarthritis of hand 06/25/2018  . Fever blister 08/17/2015  . Breast cyst 07/06/2015  . GAD (generalized anxiety disorder) 03/05/2015  . CAD in native artery 03/05/2015  . Hyperlipidemia 03/05/2015  . H/O total hysterectomy 11/30/2014  . History of basal cell cancer 11/30/2014  . Insomnia, persistent 11/30/2014  . Calcification of abdominal aorta (HCC) 11/16/2009  . Vitamin D deficiency 03/30/2009  . Major depression, recurrent, chronic ([place]) 05/13/2008  . Osteoporosis 08/20/2007  . Hypothyroidism, adult 04/30/2007  . ADD 04/30/2007    Past Surgical History:  Procedure Laterality Date  . ABDOMINAL HYSTERECTOMY    . APPENDECTOMY    . BREAST BIOPSY Bilateral    FNA- neg  . BREAST EXCISIONAL BIOPSY Left    benign  . CHOLECYSTECTOMY  1999  . TOTAL ABDOMINAL HYSTERECTOMY W/ BILATERAL SALPINGOOPHORECTOMY  1987    Family History  Problem Relation Age of Onset  . Cancer Mother 3       Breast Cancer  . Breast cancer Mother 28  . Diabetes Father   . Hypertension Father   . Heart attack Father   . Heart attack Maternal Grandmother   . CAD Maternal Grandmother   . CAD Maternal Uncle   . Stroke Paternal Grandmother    . Cancer Sister        Breast Cancer  . Diabetes Sister   . Depression Sister   . Breast cancer Sister 58  . Allergic rhinitis Son   . Allergic rhinitis Son   . Gallbladder disease Maternal Grandfather   . Gallstones Maternal Grandfather   . Alzheimer's disease Maternal Grandfather     Social History   Socioeconomic History  . Marital status: Married    Spouse name: Louie Casa  . Number of children: 2  . Years of education: Not on file  . Highest education level: High school graduate  Occupational History  . Not on file  Tobacco Use  . Smoking status: Former Smoker    Packs/day: 1.00    Years: 30.00    Pack years: 30.00    Types: Cigarettes    Start date: 03/05/1983    Quit date: 06/12/2012    Years since quitting: 7.0  . Smokeless tobacco: Never Used  . Tobacco comment: 1 ppd for 30 years   Substance and Sexual Activity  . Alcohol use: Never    Alcohol/week: 0.0 standard drinks  . Drug use: No  . Sexual activity: Yes    Partners: Male    Birth control/protection: Surgical    Comment: Hysterectomy  Other Topics Concern  . Not on file  Social History Narrative   Married, 2 grown children one lives in [place] and one in [place]   She is working remotely now - from home in [place] Strain: [place]   . Difficulty of Paying Living Expenses: Not hard at all  Food Insecurity: No Food Insecurity  . Worried About Charity fundraiser in the Last Year: Never true  . Ran Out of Food in the Last Year: Never true  Transportation Needs: No Transportation Needs  . Lack of Transportation (Medical): No  . Lack of Transportation (Non-Medical): No  Physical Activity: Inactive  . Days of Exercise per Week: 0 days  . Minutes of Exercise per Session:  0 min  Stress: Stress Concern Present  . Feeling of Stress : Rather much  Social Connections: Not Isolated  . Frequency of Communication with Friends and Family: More than three  times a week  . Frequency of Social Gatherings with Friends and Family: More than three times a week  . Attends Religious Services: More than 4 times per year  . Active Member of Clubs or Organizations: Yes  . Attends Archivist Meetings: More than 4 times per year  . Marital Status: Married  Human resources officer Violence: Not At Risk  . Fear of Current or Ex-Partner: No  . Emotionally Abused: No  . Physically Abused: No  . Sexually Abused: No     Current Outpatient Medications:  .  alendronate (FOSAMAX) 70 MG tablet, Take 1 tablet (70 mg total) by mouth every 7 (seven) days. Take with a full glass of water on an empty stomach., Disp: 12 tablet, Rfl: 3 .  ammonium lactate (LAC-HYDRIN) 12 % lotion, APPLY TOPICALLY AS NEEDED FOR DRY SKIN, Disp: 400 g, Rfl: 0 .  ARMOUR THYROID 30 MG tablet, TAKE 1 TABLET BY MOUTH EVERY DAY EXCEPT TAKE 1 1/2 TABLETS ON SUNDAY, Disp: 90 tablet, Rfl: 0 .  aspirin EC 81 MG tablet, Take 1 tablet (81 mg total) by mouth daily., Disp: 30 tablet, Rfl: 5 .  DULoxetine (CYMBALTA) 60 MG capsule, Take 1 capsule (60 mg total) by mouth daily., Disp: 90 capsule, Rfl: 1 .  gabapentin (NEURONTIN) 300 MG capsule, Take 1 capsule (300 mg total) by mouth at bedtime., Disp: 90 capsule, Rfl: 1 .  Krill Oil Omega-3 500 MG CAPS, Take 1 capsule by mouth daily., Disp: 30 capsule, Rfl: 0 .  nitroGLYCERIN (NITROSTAT) 0.4 MG SL tablet, Place 1 tablet (0.4 mg total) under the tongue every 5 (five) minutes as needed for chest pain., Disp: 20 tablet, Rfl: 2 .  PREMARIN vaginal cream, INSERT 1 APPLICATORFUL VAGINALLY 2 TIMES A WEEK, Disp: 30 g, Rfl: 10 .  rosuvastatin (CRESTOR) 10 MG tablet, TAKE 1 TABLET(10 MG) BY MOUTH DAILY, Disp: 90 tablet, Rfl: 1 .  valACYclovir (VALTREX) 1000 MG tablet, Take 1 tablet (1,000 mg total) by mouth 2 (two) times daily., Disp: 10 tablet, Rfl: 0 .  ALPRAZolam (XANAX) 0.5 MG tablet, Take 1 tablet (0.5 mg total) by mouth 2 (two) times daily as needed. for  anxiety, Disp: 10 tablet, Rfl: 0 .  hydrOXYzine (ATARAX/VISTARIL) 10 MG tablet, Take 1 tablet (10 mg total) by mouth 3 (three) times daily as needed., Disp: 30 tablet, Rfl: 0  Allergies  Allergen Reactions  . Aspirin   . Boniva [Ibandronic Acid]     Stomach pain, indigestion, diarrhea  . Bupropion Hcl     REACTION: depression  . Seroquel [Quetiapine Fumarate]     Dry mouth, dizziness  . Varenicline Tartrate     REACTION: depression     ROS  Constitutional: Negative for fever or weight change.  Respiratory: Negative for cough and shortness of breath.   Cardiovascular: Negative for chest pain or palpitations.  Gastrointestinal: Negative for abdominal pain, no bowel changes.  Musculoskeletal: Negative for gait problem or joint swelling.  Skin: Negative for rash.  Neurological: Negative for dizziness or headache.  No other specific complaints in a complete review of systems (except as listed in HPI above).  Objective   Vitals:   06/27/19 0914  BP: 100/68  Pulse: 88  Resp: 16  Temp: (!) 97.3 F (36.3 C)  TempSrc: Temporal  SpO2: 95%  Weight: 147 lb 9.6 oz (67 kg)  Height: 4' 11.25" (1.505 m)    Body mass index is 29.56 kg/m.  Physical Exam  Constitutional: Patient appears well-developed and well-nourished. No distress.  HENT: Head: Normocephalic and atraumatic. Ears: B TMs ok, no erythema or effusion; Nose: Nose normal. Mouth/Throat: not done Eyes: Conjunctivae and EOM are normal. Pupils are equal, round, and reactive to light. No scleral icterus.  Neck: Normal range of motion. Neck supple. No JVD present. No thyromegaly present.  Cardiovascular: Normal rate, regular rhythm and normal heart sounds.  No murmur heard. No BLE edema. Pulmonary/Chest: Effort normal and breath sounds normal. No respiratory distress. Abdominal: Soft. Bowel sounds are normal, no distension. There is no tenderness. no masses Breast: no lumps or masses, no nipple discharge or rashes FEMALE  GENITALIA:  Not done RECTAL: not done Musculoskeletal: Normal range of motion, no joint effusions. No gross deformities Neurological: he is alert and oriented to person, place, and time. No cranial nerve deficit. Coordination, balance, strength, speech and gait are normal.  Skin: Skin is warm and dry. No rash noted. No erythema.  Psychiatric: Patient has a normal mood and affect. behavior is normal. Judgment and thought content normal.  Fall Risk: Fall Risk  06/27/2019 10/14/2018 06/25/2018 04/15/2018 11/27/2017  Falls in the past year? 0 0 0 0 No  Number falls in past yr: 0 0 - - -  Injury with Fall? 0 0 - - -     Functional Status Survey: Is the patient deaf or have difficulty hearing?: No Does the patient have difficulty seeing, even when wearing glasses/contacts?: No Does the patient have difficulty concentrating, remembering, or making decisions?: No Does the patient have difficulty walking or climbing stairs?: No Does the patient have difficulty dressing or bathing?: No Does the patient have difficulty doing errands alone such as visiting a doctor's office or shopping?: No   Assessment & Plan   1. Well adult exam   2. Colon cancer screening  - Cologuard  3. Need for immunization against influenza  - Flu Vaccine QUAD 36+ mos IM  4. Major depression, recurrent, chronic (HCC)  - DULoxetine (CYMBALTA) 60 MG capsule; Take 1 capsule (60 mg total) by mouth daily.  Dispense: 90 capsule; Refill: 1  5. GAD (generalized anxiety disorder)  - DULoxetine (CYMBALTA) 60 MG capsule; Take 1 capsule (60 mg total) by mouth daily.  Dispense: 90 capsule; Refill: 1 - ALPRAZolam (XANAX) 0.5 MG tablet; Take 1 tablet (0.5 mg total) by mouth 2 (two) times daily as needed. for anxiety  Dispense: 10 tablet; Refill: 0 - hydrOXYzine (ATARAX/VISTARIL) 10 MG tablet; Take 1 tablet (10 mg total) by mouth 3 (three) times daily as needed.  Dispense: 30 tablet; Refill: 0  6. Age-related osteoporosis  without current pathological fracture  - DG Bone Density; Future  7. Mixed hyperlipidemia  - Lipid panel - rosuvastatin (CRESTOR) 10 MG tablet; TAKE 1 TABLET(10 MG) BY MOUTH DAILY  Dispense: 90 tablet; Refill: 1  8. CAD in native artery  - nitroGLYCERIN (NITROSTAT) 0.4 MG SL tablet; Place 1 tablet (0.4 mg total) under the tongue every 5 (five) minutes as needed for chest pain.  Dispense: 20 tablet; Refill: 2  9. B12 deficiency  - CBC with Differential/Platelet - Vitamin B12  10. Vitamin D deficiency  - VITAMIN D 25 Hydroxy (Vit-D Deficiency, Fractures)  11. Atherosclerosis of aorta (HCC)  - Lipid panel - rosuvastatin (CRESTOR) 10 MG tablet; TAKE 1 TABLET(10 MG) BY  MOUTH DAILY  Dispense: 90 tablet; Refill: 1  12. Insomnia, persistent  - gabapentin (NEURONTIN) 300 MG capsule; Take 1 capsule (300 mg total) by mouth at bedtime.  Dispense: 90 capsule; Refill: 1  13. Long-term use of high-risk medication  - COMPLETE METABOLIC PANEL WITH GFR  14. Other osteoporosis without current pathological fracture  - alendronate (FOSAMAX) 70 MG tablet; Take 1 tablet (70 mg total) by mouth every 7 (seven) days. Take with a full glass of water on an empty stomach.  Dispense: 12 tablet; Refill: 3  15. Diabetes mellitus screening  - Hemoglobin A1c  16. RLS (restless legs syndrome)  - gabapentin (NEURONTIN) 300 MG capsule; Take 1 capsule (300 mg total) by mouth at bedtime.  Dispense: 90 capsule; Refill: 1  17. Fever blister  - valACYclovir (VALTREX) 1000 MG tablet; Take 1 tablet (1,000 mg total) by mouth 2 (two) times daily.  Dispense: 10 tablet; Refill: 0  18. Hives  - hydrOXYzine (ATARAX/VISTARIL) 10 MG tablet; Take 1 tablet (10 mg total) by mouth 3 (three) times daily as needed.  Dispense: 30 tablet; Refill: 0  19. Hypothyroidism, adult  - TSH  20. Vaginal atrophy  - PREMARIN vaginal cream; INSERT 1 APPLICATORFUL VAGINALLY 2 TIMES A WEEK  Dispense: 30 g; Refill: 10  -USPSTF  grade A and B recommendations reviewed with patient; age-appropriate recommendations, preventive care, screening tests, etc discussed and encouraged; healthy living encouraged; see AVS for patient education given to patient -Discussed importance of 150 minutes of physical activity weekly, eat two servings of fish weekly, eat one serving of tree nuts ( cashews, pistachios, pecans, almonds.Marland Kitchen) every other day, eat 6 servings of fruit/vegetables daily and drink plenty of water and avoid sweet beverages.

## 2019-06-28 LAB — COMPLETE METABOLIC PANEL WITH GFR
AG Ratio: 1.7 (calc) (ref 1.0–2.5)
ALT: 15 U/L (ref 6–29)
AST: 19 U/L (ref 10–35)
Albumin: 4.4 g/dL (ref 3.6–5.1)
Alkaline phosphatase (APISO): 113 U/L (ref 37–153)
BUN: 12 mg/dL (ref 7–25)
CO2: 28 mmol/L (ref 20–32)
Calcium: 9.8 mg/dL (ref 8.6–10.4)
Chloride: 102 mmol/L (ref 98–110)
Creat: 0.84 mg/dL (ref 0.50–0.99)
GFR, Est African American: 86 mL/min/{1.73_m2} (ref >=60)
GFR, Est Non African American: 74 mL/min/{1.73_m2} (ref >=60)
Globulin: 2.6 g/dL (calc) (ref 1.9–3.7)
Glucose, Bld: 98 mg/dL (ref 65–99)
Potassium: 4.1 mmol/L (ref 3.5–5.3)
Sodium: 138 mmol/L (ref 135–146)
Total Bilirubin: 0.4 mg/dL (ref 0.2–1.2)
Total Protein: 7 g/dL (ref 6.1–8.1)

## 2019-06-28 LAB — CBC WITH DIFFERENTIAL/PLATELET
Absolute Monocytes: 616 cells/uL (ref 200–950)
Basophils Absolute: 47 cells/uL (ref 0–200)
Basophils Relative: 0.6 %
Eosinophils Absolute: 273 cells/uL (ref 15–500)
Eosinophils Relative: 3.5 %
HCT: 41.4 % (ref 35.0–45.0)
Hemoglobin: 13.4 g/dL (ref 11.7–15.5)
Lymphs Abs: 1482 cells/uL (ref 850–3900)
MCH: 27 pg (ref 27.0–33.0)
MCHC: 32.4 g/dL (ref 32.0–36.0)
MCV: 83.5 fL (ref 80.0–100.0)
MPV: 10.2 fL (ref 7.5–12.5)
Monocytes Relative: 7.9 %
Neutro Abs: 5382 cells/uL (ref 1500–7800)
Neutrophils Relative %: 69 %
Platelets: 337 10*3/uL (ref 140–400)
RBC: 4.96 10*6/uL (ref 3.80–5.10)
RDW: 12.6 % (ref 11.0–15.0)
Total Lymphocyte: 19 %
WBC: 7.8 10*3/uL (ref 3.8–10.8)

## 2019-06-28 LAB — VITAMIN D 25 HYDROXY (VIT D DEFICIENCY, FRACTURES): Vit D, 25-Hydroxy: 29 ng/mL — ABNORMAL LOW (ref 30–100)

## 2019-06-28 LAB — LIPID PANEL
Cholesterol: 240 mg/dL — ABNORMAL HIGH (ref <200)
HDL: 43 mg/dL — ABNORMAL LOW (ref >=50)
LDL Cholesterol (Calc): 156 mg/dL (calc) — ABNORMAL HIGH
Non-HDL Cholesterol (Calc): 197 mg/dL (calc) — ABNORMAL HIGH (ref <130)
Total CHOL/HDL Ratio: 5.6 (calc) — ABNORMAL HIGH (ref <5.0)
Triglycerides: 240 mg/dL — ABNORMAL HIGH (ref <150)

## 2019-06-28 LAB — TSH: TSH: 2.15 mIU/L (ref 0.40–4.50)

## 2019-06-28 LAB — HEMOGLOBIN A1C
Hgb A1c MFr Bld: 5.8 % of total Hgb — ABNORMAL HIGH (ref <5.7)
Mean Plasma Glucose: 120 (calc)
eAG (mmol/L): 6.6 (calc)

## 2019-06-28 LAB — VITAMIN B12: Vitamin B-12: 552 pg/mL (ref 200–1100)

## 2019-07-18 DIAGNOSIS — L538 Other specified erythematous conditions: Secondary | ICD-10-CM | POA: Diagnosis not present

## 2019-07-18 DIAGNOSIS — L820 Inflamed seborrheic keratosis: Secondary | ICD-10-CM | POA: Diagnosis not present

## 2019-07-18 DIAGNOSIS — L570 Actinic keratosis: Secondary | ICD-10-CM | POA: Diagnosis not present

## 2019-07-18 DIAGNOSIS — L503 Dermatographic urticaria: Secondary | ICD-10-CM | POA: Diagnosis not present

## 2019-07-18 DIAGNOSIS — D2261 Melanocytic nevi of right upper limb, including shoulder: Secondary | ICD-10-CM | POA: Diagnosis not present

## 2019-07-18 DIAGNOSIS — D2262 Melanocytic nevi of left upper limb, including shoulder: Secondary | ICD-10-CM | POA: Diagnosis not present

## 2019-07-18 DIAGNOSIS — X32XXXA Exposure to sunlight, initial encounter: Secondary | ICD-10-CM | POA: Diagnosis not present

## 2019-07-18 DIAGNOSIS — Z85828 Personal history of other malignant neoplasm of skin: Secondary | ICD-10-CM | POA: Diagnosis not present

## 2019-07-21 ENCOUNTER — Encounter: Payer: Self-pay | Admitting: *Deleted

## 2019-07-21 ENCOUNTER — Telehealth: Payer: Self-pay | Admitting: *Deleted

## 2019-07-21 DIAGNOSIS — Z87891 Personal history of nicotine dependence: Secondary | ICD-10-CM

## 2019-07-21 NOTE — Telephone Encounter (Signed)
Left message for patient to notify them that it is time to schedule annual low dose lung cancer screening CT scan. Instructed patient to call back to verify information prior to the scan being scheduled.  

## 2019-07-21 NOTE — Telephone Encounter (Signed)
Patient has been notified that annual lung cancer screening low dose CT scan is due currently or will be in near future. Confirmed that patient is within the age range of 55-77, and asymptomatic, (no signs or symptoms of lung cancer). Patient denies illness that would prevent curative treatment for lung cancer if found. Verified smoking history, (former, quit 06/12/12, 30 pack year). The shared decision making visit was done 07/18/18. Patient is agreeable for CT scan being scheduled.

## 2019-08-01 ENCOUNTER — Ambulatory Visit: Admission: RE | Admit: 2019-08-01 | Payer: BC Managed Care – PPO | Source: Ambulatory Visit

## 2019-08-05 ENCOUNTER — Ambulatory Visit
Admission: RE | Admit: 2019-08-05 | Discharge: 2019-08-05 | Disposition: A | Discharge status: Home / Self Care | Payer: BC Managed Care – PPO | Source: Ambulatory Visit | Attending: Nurse Practitioner | Admitting: Nurse Practitioner

## 2019-08-05 ENCOUNTER — Other Ambulatory Visit: Payer: Self-pay

## 2019-08-05 DIAGNOSIS — Z87891 Personal history of nicotine dependence: Secondary | ICD-10-CM | POA: Diagnosis not present

## 2019-08-08 ENCOUNTER — Encounter: Payer: Self-pay | Admitting: *Deleted

## 2019-08-18 DIAGNOSIS — N390 Urinary tract infection, site not specified: Secondary | ICD-10-CM | POA: Diagnosis not present

## 2019-09-09 ENCOUNTER — Other Ambulatory Visit: Payer: Self-pay | Admitting: Family Medicine

## 2019-09-09 DIAGNOSIS — E039 Hypothyroidism, unspecified: Secondary | ICD-10-CM

## 2019-10-13 DIAGNOSIS — J40 Bronchitis, not specified as acute or chronic: Secondary | ICD-10-CM | POA: Diagnosis not present

## 2019-10-13 DIAGNOSIS — Z1152 Encounter for screening for COVID-19: Secondary | ICD-10-CM | POA: Diagnosis not present

## 2019-12-23 ENCOUNTER — Other Ambulatory Visit: Payer: Self-pay | Admitting: Family Medicine

## 2019-12-23 DIAGNOSIS — E039 Hypothyroidism, unspecified: Secondary | ICD-10-CM

## 2019-12-25 ENCOUNTER — Ambulatory Visit: Payer: BC Managed Care – PPO | Admitting: Family Medicine

## 2020-02-10 ENCOUNTER — Other Ambulatory Visit: Payer: Self-pay | Admitting: Family Medicine

## 2020-02-10 DIAGNOSIS — F339 Major depressive disorder, recurrent, unspecified: Secondary | ICD-10-CM

## 2020-02-10 DIAGNOSIS — F411 Generalized anxiety disorder: Secondary | ICD-10-CM

## 2020-02-10 NOTE — Telephone Encounter (Signed)
Requested medication (s) are due for refill today: Yes  Requested medication (s) are on the active medication list: Yes  Last refill:  06/2019  Future visit scheduled: No  Notes to clinic:  Unable to refill per protocol, failed encounter within last 6 months.     Requested Prescriptions  Pending Prescriptions Disp Refills   DULoxetine (CYMBALTA) 60 MG capsule [Pharmacy Med Name: DULOXETINE DR 60MG  CAPSULES] 90 capsule 1    Sig: TAKE 1 CAPSULE(60 MG) BY MOUTH DAILY      Psychiatry: Antidepressants - SNRI Failed - 02/10/2020  8:53 PM      Failed - Valid encounter within last 6 months    Recent Outpatient Visits           7 months ago Well adult exam   [facility] Steele Sizer, MD   1 year ago Hypothyroidism, adult   [facility] Steele Sizer, MD   1 year ago Well woman exam   [facility] Steele Sizer, MD   1 year ago Major depression, recurrent, chronic [facility])   [facility] Steele Sizer, MD   2 years ago Major depression, recurrent, chronic [facility])   [facility] [place], Drue Stager, MD              Passed - Completed PHQ-2 or PHQ-9 in the last 360 days.      Passed - Last BP in normal range    BP Readings from Last 1 Encounters:  06/27/19 100/68

## 2020-02-13 ENCOUNTER — Other Ambulatory Visit: Payer: Self-pay

## 2020-02-13 DIAGNOSIS — F339 Major depressive disorder, recurrent, unspecified: Secondary | ICD-10-CM

## 2020-02-13 DIAGNOSIS — F411 Generalized anxiety disorder: Secondary | ICD-10-CM

## 2020-02-13 MED ORDER — DULOXETINE HCL 60 MG PO CPEP: 60.0000 mg | ORAL_CAPSULE | Freq: Every day | ORAL | 0 refills | Status: DC

## 2020-02-25 NOTE — Progress Notes (Signed)
Name: Denise Mason   MRN: [id number]    DOB: [birth year]   Date:02/26/2020       Progress Note  Subjective  Chief Complaint  Follow up/Medication Refill  HPI    Major Depression and GAD: she has had multiple episodesof depression in the past ,shehas tried and not tolerated or not responded to Prozac, Zoloft and Wellbutrin. She states some of the medication makes her very angry and is afraid to take them. We started her on Duloxetine back in September 2016 , she stated the first week she had nausea and headache, however those symptoms resolved. Now family life has improved, son is doing well and her husband finally recovered from [place]. Stress at work has improved also, she has been working from home and that has helped. She has not been taking alprazolam, doing well on Duloxetine   Insomnia: . Shewason Temazepam22.5 mgand initially it worked for her but after a while no longer it helped. She  granddaughter stated that she snores, also husband has noticed snoring is louder and more frequent. ESSwas high  advised sleep study but she is not interested. .She has long history of RLS and since started on Gabapentin she started to sleep better, she is still waking up after 4 hours of sleep, she has added Melatonin in the middle of the night and has been falling asleep quickly   CAD and Precordial chest pain: she had a cath done in 2010 that showed mild LAD lesion. She was seen in June 2016 and had an episode of substernal chest pain, heavy sensation, that was triggered by stress. She was seen by Dr. Saralyn Pilar and had a negative echo stress test 2017. LAD lesion was stable, she is on Toprol XL, aspirin and Atorvastatin - but not very compliant with statin because of muscle aches, we switched to Crestor 10 mg but after a few weeks she aches, we will try crestor with coq10 a few days a week and add Zetia, recheck labs next visit.  Hypothyroidism: always has dry skin, no change in bowel movements,   no fatigue.Last TSH was at goal, recheck yearly   Osteoporosis: she has been taking Fosamaxand states initially had diarrhea the day after but that has resolved now. Good compliance with medication. Explained she needs to have bone density repeated, and we will check labs including vitamin D level   Hives: she has noticed some hives in the mornings when she gets out of her showed and sometimes at night. She is using lachytrin and seems to help.   Fever blister: she thinks stress made her have an outbreak on lower left lip this week, we will send Valtrex    Patient Active Problem List   Diagnosis Date Noted  . Nodule of lower lobe of left lung 07/18/2018  . Atherosclerosis of aorta ([place]) 07/18/2018  . Localized, primary osteoarthritis of hand 06/25/2018  . Fever blister 08/17/2015  . Breast cyst 07/06/2015  . GAD (generalized anxiety disorder) 03/05/2015  . CAD in native artery 03/05/2015  . Hyperlipidemia 03/05/2015  . H/O total hysterectomy 11/30/2014  . History of basal cell cancer 11/30/2014  . Insomnia, persistent 11/30/2014  . Calcification of abdominal aorta (HCC) 11/16/2009  . Aortic valve disorder 11/16/2009  . Vitamin D deficiency 03/30/2009  . Abnormal ballistocardiogram 02/12/2009  . Major depression, recurrent, chronic ([place]) 05/13/2008  . Osteoporosis 08/20/2007  . Hypothyroidism, adult 04/30/2007  . ADD 04/30/2007    Past Surgical History:  Procedure Laterality Date  .  ABDOMINAL HYSTERECTOMY    . APPENDECTOMY    . BREAST BIOPSY Bilateral    FNA- neg  . BREAST EXCISIONAL BIOPSY Left    benign  . CHOLECYSTECTOMY  1999  . TOTAL ABDOMINAL HYSTERECTOMY W/ BILATERAL SALPINGOOPHORECTOMY  1987    Family History  Problem Relation Age of Onset  . Cancer Mother 64       Breast Cancer  . Breast cancer Mother 49  . Diabetes Father   . Hypertension Father   . Heart attack Father   . Heart attack Maternal Grandmother   . CAD Maternal Grandmother   . CAD  Maternal Uncle   . Stroke Paternal Grandmother   . Cancer Sister        Breast Cancer  . Diabetes Sister   . Depression Sister   . Breast cancer Sister 12  . Allergic rhinitis Son   . Allergic rhinitis Son   . Gallbladder disease Maternal Grandfather   . Gallstones Maternal Grandfather   . Alzheimer's disease Maternal Grandfather     Social History   Tobacco Use  . Smoking status: Former Smoker    Packs/day: 1.00    Years: 30.00    Pack years: 30.00    Types: Cigarettes    Start date: 03/05/1983    Quit date: 06/12/2012    Years since quitting: 7.7  . Smokeless tobacco: Never Used  . Tobacco comment: 1 ppd for 30 years   Substance Use Topics  . Alcohol use: Never    Alcohol/week: 0.0 standard drinks     Current Outpatient Medications:  .  alendronate (FOSAMAX) 70 MG tablet, Take 1 tablet (70 mg total) by mouth every 7 (seven) days. Take with a full glass of water on an empty stomach., Disp: 12 tablet, Rfl: 3 .  ALPRAZolam (XANAX) 0.5 MG tablet, Take 1 tablet (0.5 mg total) by mouth 2 (two) times daily as needed. for anxiety, Disp: 10 tablet, Rfl: 0 .  ammonium lactate (LAC-HYDRIN) 12 % lotion, APPLY TOPICALLY AS NEEDED FOR DRY SKIN, Disp: 400 g, Rfl: 0 .  ARMOUR THYROID 30 MG tablet, TAKE 1 TABLET BY MOUTH EVERY DAY EXCEPT TAKE 1 1/2 TABLETS ON SUNDAY, Disp: 90 tablet, Rfl: 2 .  aspirin EC 81 MG tablet, Take 1 tablet (81 mg total) by mouth daily., Disp: 30 tablet, Rfl: 5 .  benzonatate (TESSALON) 100 MG capsule, Take by mouth., Disp: , Rfl:  .  DULoxetine (CYMBALTA) 60 MG capsule, Take 1 capsule (60 mg total) by mouth daily., Disp: 30 capsule, Rfl: 0 .  gabapentin (NEURONTIN) 300 MG capsule, Take 1 capsule (300 mg total) by mouth at bedtime., Disp: 90 capsule, Rfl: 1 .  hydrOXYzine (ATARAX/VISTARIL) 10 MG tablet, Take 1 tablet (10 mg total) by mouth 3 (three) times daily as needed., Disp: 30 tablet, Rfl: 0 .  Krill Oil Omega-3 500 MG CAPS, Take 1 capsule by mouth daily., Disp:  30 capsule, Rfl: 0 .  nitroGLYCERIN (NITROSTAT) 0.4 MG SL tablet, Place 1 tablet (0.4 mg total) under the tongue every 5 (five) minutes as needed for chest pain., Disp: 20 tablet, Rfl: 2 .  PREMARIN vaginal cream, INSERT 1 APPLICATORFUL VAGINALLY 2 TIMES A WEEK, Disp: 30 g, Rfl: 10 .  rosuvastatin (CRESTOR) 10 MG tablet, TAKE 1 TABLET(10 MG) BY MOUTH DAILY, Disp: 90 tablet, Rfl: 1 .  valACYclovir (VALTREX) 1000 MG tablet, Take 1 tablet (1,000 mg total) by mouth 2 (two) times daily. (Patient not taking: Reported on 02/26/2020), Disp: 10  tablet, Rfl: 0  Allergies  Allergen Reactions  . Aspirin   . Boniva [Ibandronic Acid]     Stomach pain, indigestion, diarrhea  . Bupropion Hcl     REACTION: depression  . Quetiapine     Dry mouth, dizziness  . Seroquel [Quetiapine Fumarate]     Dry mouth, dizziness  . Varenicline Nausea And Vomiting    REACTION: depression REACTION: depression  . Varenicline Tartrate     REACTION: depression  . Codeine Anxiety    I personally reviewed active problem list, medication list, allergies, family history, social history, health maintenance with the patient/caregiver today.   ROS  Constitutional: Negative for fever or weight change.  Respiratory: Negative for cough and shortness of breath.   Cardiovascular: Negative for chest pain or palpitations.  Gastrointestinal: Negative for abdominal pain, no bowel changes.  Musculoskeletal: Negative for gait problem or joint swelling.  Skin: Negative for rash.  Neurological: Negative for dizziness or headache.  No other specific complaints in a complete review of systems (except as listed in HPI above).  Objective  Vitals:   02/26/20 1126  BP: 108/70  Pulse: 94  Resp: 16  Temp: 98.1 F (36.7 C)  TempSrc: Oral  SpO2: 96%  Weight: 145 lb (65.8 kg)  Height: 4\' 11"  (1.499 m)    Body mass index is 29.29 kg/m.  Physical Exam  Constitutional: Patient appears well-developed and well-nourished.  Overweight. No distress.  HEENT: head atraumatic, normocephalic, pupils equal and reactive to light,  neck supple Cardiovascular: Normal rate, regular rhythm and normal heart sounds.  No murmur heard. No BLE edema. Pulmonary/Chest: Effort normal and breath sounds normal. No respiratory distress. Abdominal: Soft.  There is no tenderness. Psychiatric: Patient has a normal mood and affect. behavior is normal. Judgment and thought content normal.  PHQ2/9: Depression screen [facility] 2/9 02/26/2020 06/27/2019 10/14/2018 06/25/2018 04/15/2018  Decreased Interest 0 1 1 0 0  Down, Depressed, Hopeless 0 0 0 0 0  PHQ - 2 Score 0 1 1 0 0  Altered sleeping - 3 3 2 3   Tired, decreased energy - 0 1 1 3   Change in appetite - 1 2 1  0  Feeling bad or failure about yourself  - 1 1 0 0  Trouble concentrating - 0 0 0 0  Moving slowly or fidgety/restless - 0 0 0 0  Suicidal thoughts - 0 0 0 0  PHQ-9 Score - 6 8 4 6   Difficult doing work/chores - Not difficult at all Somewhat difficult Not difficult at all Not difficult at all  Some recent data might be hidden    phq 9 is negative   Fall Risk: Fall Risk  02/26/2020 06/27/2019 10/14/2018 06/25/2018 04/15/2018  Falls in the past year? 0 0 0 0 0  Number falls in past yr: 0 0 0 - -  Injury with Fall? 0 0 0 - -     Functional Status Survey: Is the patient deaf or have difficulty hearing?: No Does the patient have difficulty seeing, even when wearing glasses/contacts?: No Does the patient have difficulty concentrating, remembering, or making decisions?: No Does the patient have difficulty walking or climbing stairs?: No Does the patient have difficulty dressing or bathing?: No Does the patient have difficulty doing errands alone such as visiting a doctor's office or shopping?: No   Assessment & Plan  1. Major depression in remission ([place])  Continue Duloxetine   2. GAD (generalized anxiety disorder)  - DULoxetine (CYMBALTA) 60 MG capsule; Take  1 capsule (60 mg  total) by mouth daily.  Dispense: 90 capsule; Refill: 1  3. Age-related osteoporosis without current pathological fracture   4. Colon cancer screening  - Ambulatory referral to Gastroenterology  5. B12 deficiency   6. CAD in native artery  On aspirin, resume statin therapy   7. Mixed hyperlipidemia  - rosuvastatin (CRESTOR) 10 MG tablet; Take 1 tablet (10 mg total) by mouth 3 (three) times a week. TAKE 1 TABLET(10 MG) BY MOUTH DAILY  Dispense: 36 tablet; Refill: 1  8. Vitamin D deficiency  Continue supplementation   9. Atherosclerosis of aorta (HCC)  - rosuvastatin (CRESTOR) 10 MG tablet; Take 1 tablet (10 mg total) by mouth 3 (three) times a week. TAKE 1 TABLET(10 MG) BY MOUTH DAILY  Dispense: 36 tablet; Refill: 1  10. Insomnia, persistent  - gabapentin (NEURONTIN) 300 MG capsule; Take 1 capsule (300 mg total) by mouth at bedtime.  Dispense: 90 capsule; Refill: 1  11. Hypothyroidism, adult  Normal TSH   12. Calcification of abdominal aorta (HCC)   13. RLS (restless legs syndrome)  - gabapentin (NEURONTIN) 300 MG capsule; Take 1 capsule (300 mg total) by mouth at bedtime.  Dispense: 90 capsule; Refill: 1  14. Pre-diabetes  Discussed low carb diet

## 2020-02-26 ENCOUNTER — Ambulatory Visit: Payer: BC Managed Care – PPO | Admitting: Family Medicine

## 2020-02-26 ENCOUNTER — Encounter: Payer: Self-pay | Admitting: Family Medicine

## 2020-02-26 ENCOUNTER — Other Ambulatory Visit: Payer: Self-pay

## 2020-02-26 VITALS — BP 108/70 | HR 94 | Temp 98.1°F | Resp 16 | Ht 59.0 in | Wt 145.0 lb

## 2020-02-26 DIAGNOSIS — F411 Generalized anxiety disorder: Secondary | ICD-10-CM

## 2020-02-26 DIAGNOSIS — M810 Age-related osteoporosis without current pathological fracture: Secondary | ICD-10-CM | POA: Diagnosis not present

## 2020-02-26 DIAGNOSIS — G2581 Restless legs syndrome: Secondary | ICD-10-CM

## 2020-02-26 DIAGNOSIS — Z1211 Encounter for screening for malignant neoplasm of colon: Secondary | ICD-10-CM

## 2020-02-26 DIAGNOSIS — G4700 Insomnia, unspecified: Secondary | ICD-10-CM

## 2020-02-26 DIAGNOSIS — I700 Atherosclerosis of aorta: Secondary | ICD-10-CM

## 2020-02-26 DIAGNOSIS — E538 Deficiency of other specified B group vitamins: Secondary | ICD-10-CM

## 2020-02-26 DIAGNOSIS — F339 Major depressive disorder, recurrent, unspecified: Secondary | ICD-10-CM

## 2020-02-26 DIAGNOSIS — I2510 Atherosclerotic heart disease of native coronary artery without angina pectoris: Secondary | ICD-10-CM

## 2020-02-26 DIAGNOSIS — R7303 Prediabetes: Secondary | ICD-10-CM

## 2020-02-26 DIAGNOSIS — E559 Vitamin D deficiency, unspecified: Secondary | ICD-10-CM

## 2020-02-26 DIAGNOSIS — F325 Major depressive disorder, single episode, in full remission: Secondary | ICD-10-CM

## 2020-02-26 DIAGNOSIS — E782 Mixed hyperlipidemia: Secondary | ICD-10-CM

## 2020-02-26 DIAGNOSIS — E039 Hypothyroidism, unspecified: Secondary | ICD-10-CM

## 2020-02-26 MED ORDER — DULOXETINE HCL 60 MG PO CPEP: 60.0000 mg | ORAL_CAPSULE | Freq: Every day | ORAL | 1 refills | Status: AC

## 2020-02-26 MED ORDER — ROSUVASTATIN CALCIUM 10 MG PO TABS: 10.0000 mg | ORAL_TABLET | ORAL | 1 refills | Status: AC

## 2020-02-26 MED ORDER — EZETIMIBE 10 MG PO TABS: 10.0000 mg | ORAL_TABLET | Freq: Every day | ORAL | 1 refills | Status: AC

## 2020-02-26 MED ORDER — GABAPENTIN 300 MG PO CAPS: 300.0000 mg | ORAL_CAPSULE | Freq: Every day | ORAL | 1 refills | Status: AC

## 2020-02-26 MED ORDER — COQ-10 100 MG PO CAPS: 1.0000 | ORAL_CAPSULE | ORAL | 0 refills | Status: AC

## 2020-03-03 DIAGNOSIS — R040 Epistaxis: Secondary | ICD-10-CM | POA: Diagnosis not present

## 2020-03-03 DIAGNOSIS — J342 Deviated nasal septum: Secondary | ICD-10-CM | POA: Diagnosis not present

## 2020-03-12 DIAGNOSIS — R059 Cough, unspecified: Secondary | ICD-10-CM | POA: Diagnosis not present

## 2020-03-21 DIAGNOSIS — R062 Wheezing: Secondary | ICD-10-CM | POA: Diagnosis not present

## 2020-03-21 DIAGNOSIS — R059 Cough, unspecified: Secondary | ICD-10-CM | POA: Diagnosis not present

## 2020-03-21 DIAGNOSIS — J0100 Acute maxillary sinusitis, unspecified: Secondary | ICD-10-CM | POA: Diagnosis not present

## 2020-03-21 DIAGNOSIS — R918 Other nonspecific abnormal finding of lung field: Secondary | ICD-10-CM | POA: Diagnosis not present

## 2020-03-25 DIAGNOSIS — M7542 Impingement syndrome of left shoulder: Secondary | ICD-10-CM | POA: Diagnosis not present

## 2020-04-20 DIAGNOSIS — M7542 Impingement syndrome of left shoulder: Secondary | ICD-10-CM | POA: Diagnosis not present

## 2020-05-13 DIAGNOSIS — M7542 Impingement syndrome of left shoulder: Secondary | ICD-10-CM | POA: Diagnosis not present

## 2020-05-13 DIAGNOSIS — M75122 Complete rotator cuff tear or rupture of left shoulder, not specified as traumatic: Secondary | ICD-10-CM | POA: Diagnosis not present

## 2020-06-29 ENCOUNTER — Encounter: Payer: BC Managed Care – PPO | Admitting: Family Medicine

## 2020-08-04 ENCOUNTER — Telehealth: Payer: Self-pay | Admitting: *Deleted

## 2020-08-04 NOTE — Telephone Encounter (Signed)
Attempted to contact and schedule lung screening scan. Message left for patient to call back to schedule. 

## 2020-08-06 ENCOUNTER — Telehealth: Payer: Self-pay | Admitting: *Deleted

## 2020-08-06 NOTE — Telephone Encounter (Signed)
Left VM asking for return call to set up annual lung ct screening.

## 2020-08-11 ENCOUNTER — Telehealth: Payer: Self-pay | Admitting: *Deleted

## 2020-08-12 ENCOUNTER — Telehealth: Payer: Self-pay | Admitting: *Deleted

## 2020-08-12 ENCOUNTER — Encounter: Payer: Self-pay | Admitting: *Deleted

## 2020-08-12 NOTE — Telephone Encounter (Signed)
Attempted to contact patient to schedule lung screening ct scan. Left a detailed vm for patient to call [phone] to schedule scan. Also sent mychart message.

## 2020-08-13 ENCOUNTER — Telehealth: Payer: Self-pay

## 2020-08-13 NOTE — Telephone Encounter (Signed)
08/13/20 Pt  Sent a message through [place]  On 08/12/20 that she no longer lives in the area, and will not be back anytime soon.[facility]

## 2020-08-19 ENCOUNTER — Other Ambulatory Visit: Payer: Self-pay | Admitting: Family Medicine

## 2020-08-19 DIAGNOSIS — M810 Age-related osteoporosis without current pathological fracture: Secondary | ICD-10-CM

## 2020-08-30 ENCOUNTER — Ambulatory Visit: Payer: BC Managed Care – PPO | Admitting: Family Medicine

## 2021-01-03 IMAGING — MG DIGITAL DIAGNOSTIC BILATERAL MAMMOGRAM WITH TOMO AND CAD
6 of 10 series · 6 of 30 positions shown · non-contrast
Comparison: Previous exam(s).

CLINICAL DATA: Right inner breast area of palpable concern felt by
the patient. History of bilateral benign core needle biopsies and
benign excisional left breast biopsy.

EXAM:
DIGITAL DIAGNOSTIC BILATERAL MAMMOGRAM WITH CAD AND TOMO
ULTRASOUND RIGHT BREAST

[L CC synth-2D]
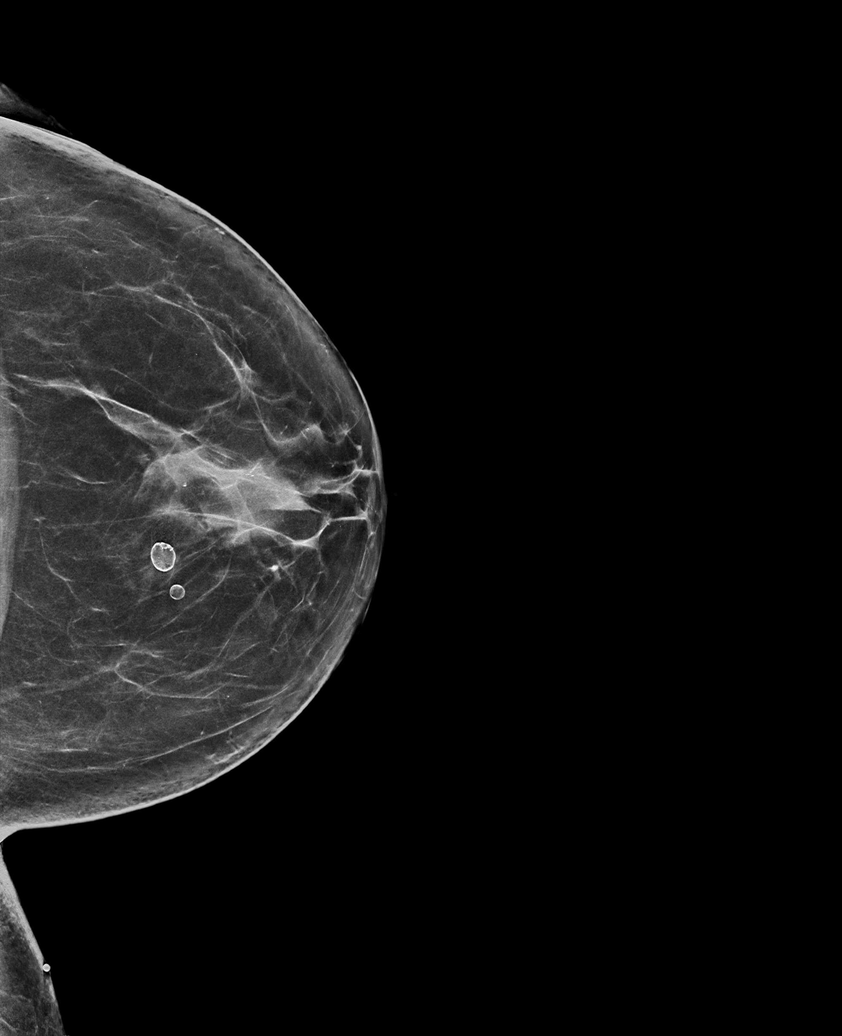

[R MLO synth-2D]
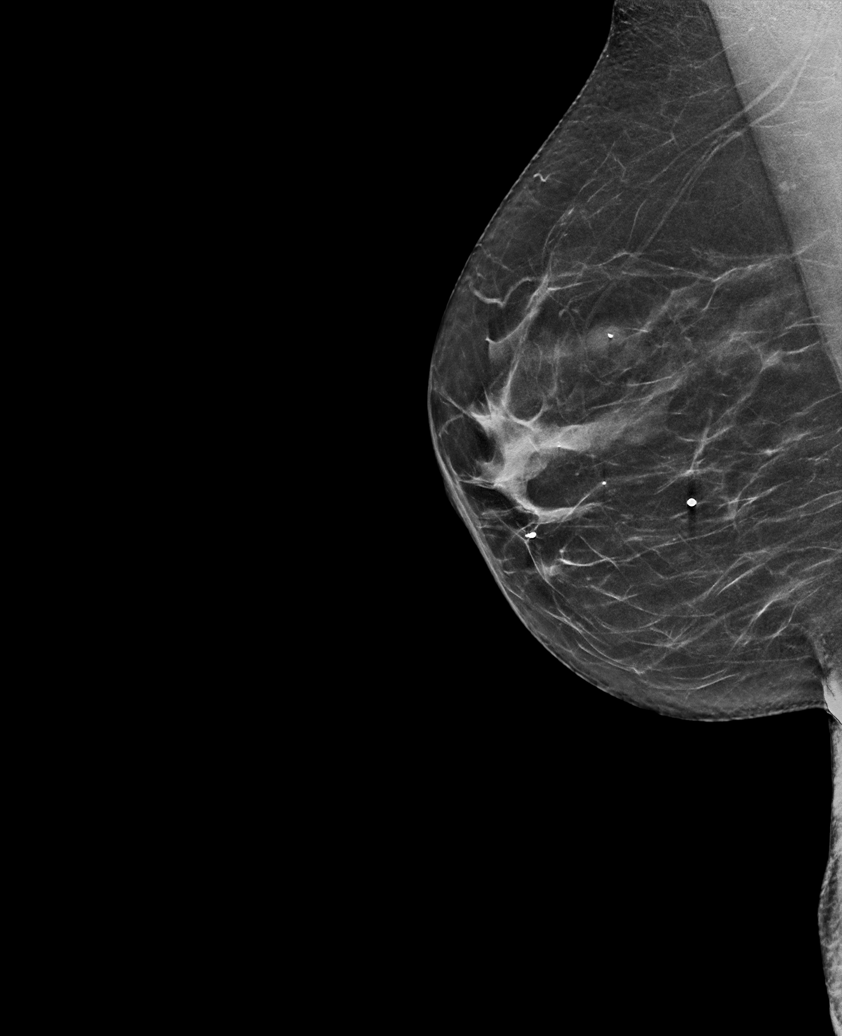

[R CC synth-2D (1 of 2)]
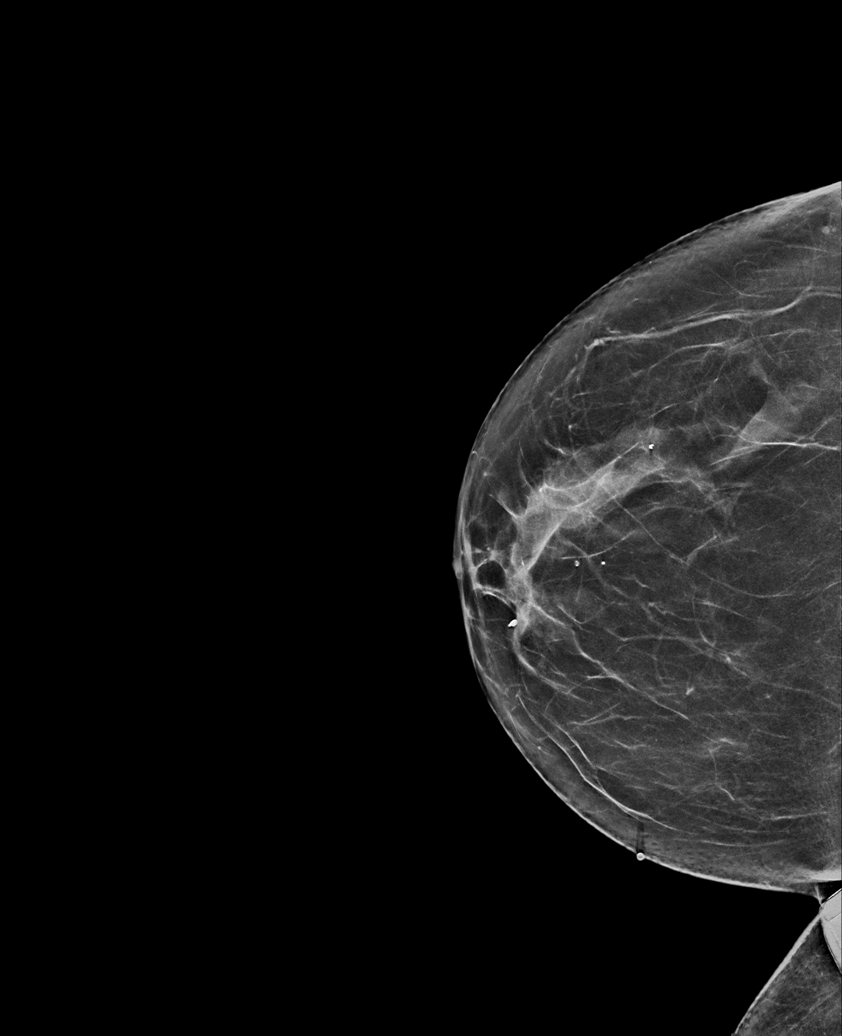

[L MLO synth-2D]
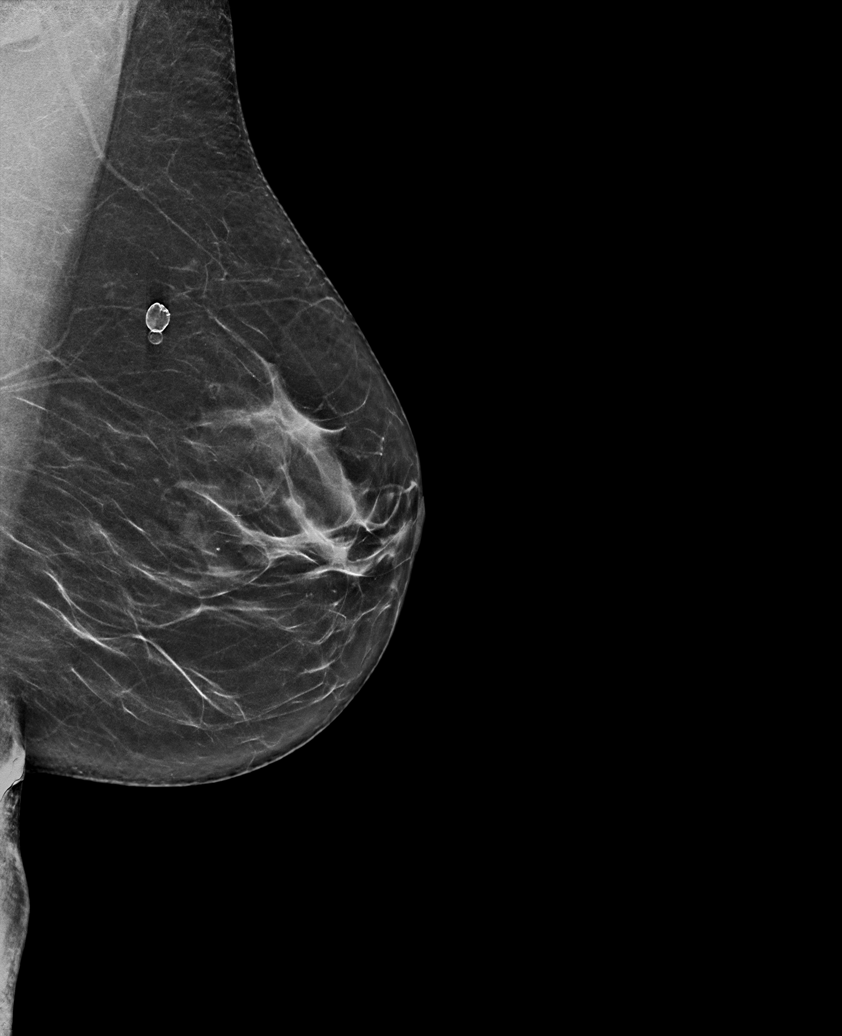

[R CC synth-2D (2 of 2)]
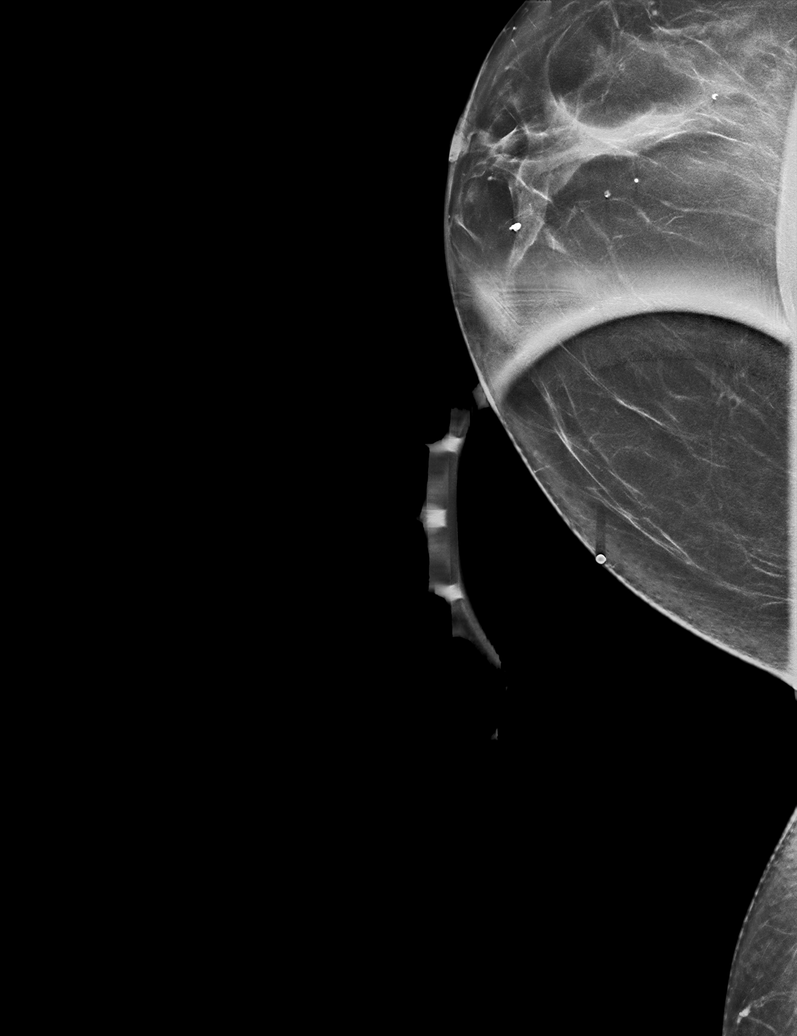

[R MLO tomo · tomo slice 31/62.0]
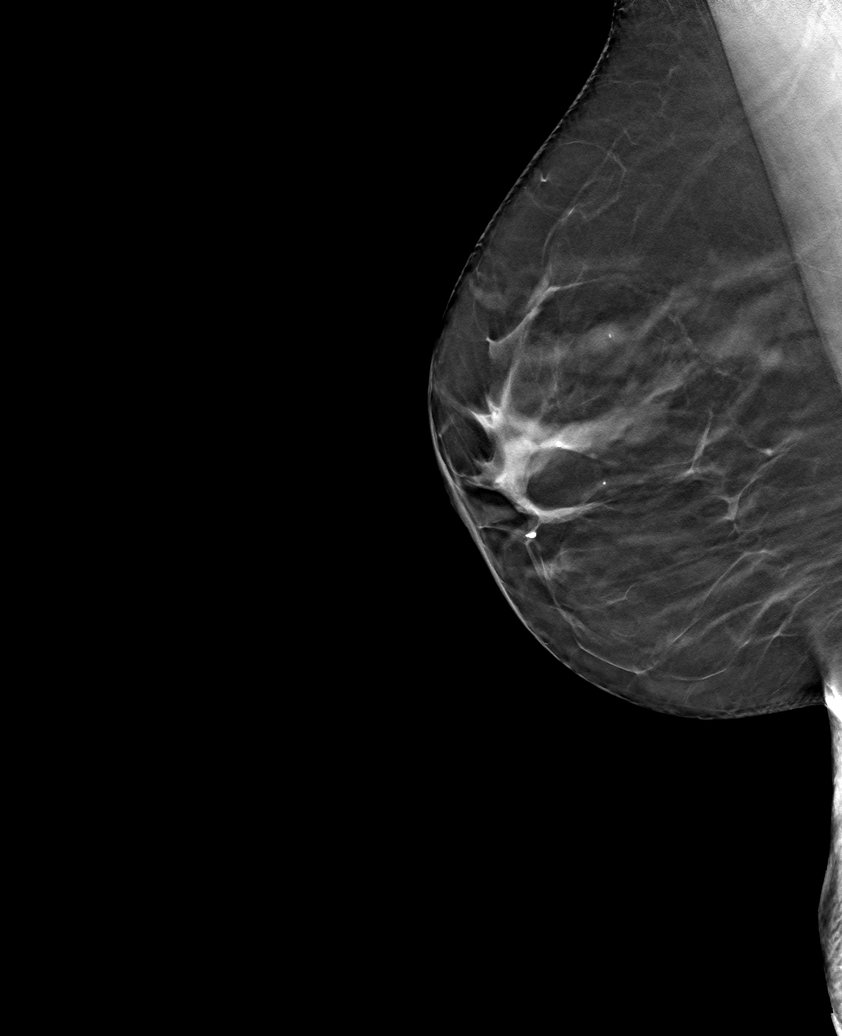

[6 of 30 positions shown; findings below may reference images not displayed]

ACR Breast Density Category b: There are scattered areas of
fibroglandular density.
FINDINGS: Mammographically, there are no suspicious masses, areas of
nonsurgical architectural distortion or microcalcifications in
either breast. No mammographic findings are seen to correspond to
the area of palpable concern in the right medial breast.

Mammographic images were processed with CAD.

On physical exam, soft elongated mobile superficially located
palpable mass in the right 2:30 o'clock breast, posterior depth..

Targeted ultrasound is performed, showing right breast 2:30 o'clock
7 cm from the nipple hyperechoic circumscribed horizontally oriented
superficial mass which measures 1.8 x 0.6 by 1.3 cm. This finding
corresponds to the area of palpable concern. Adjacent to it, there
is a second 0.4 x 0.3 x 0.5 cm similar in imaging characteristics
nodule.
IMPRESSION: Right breast 2:30 o'clock palpable benign-appearing mass, probably
representing a lipoma, with an adjacent similar in appearance 5 mm
nodule.

RECOMMENDATION:
Recommend follow-up with a focused ultrasound of the right breast in
6 months.

I have discussed the findings and recommendations with the patient.
Results were also provided in writing at the conclusion of the
visit. If applicable, a reminder letter will be sent to the patient
regarding the next appointment.

BI-RADS CATEGORY  3: Probably benign.

## 2023-04-17 ENCOUNTER — Other Ambulatory Visit: Payer: Self-pay | Admitting: Internal Medicine

## 2023-04-17 DIAGNOSIS — Z0000 Encounter for general adult medical examination without abnormal findings: Secondary | ICD-10-CM

## 2023-04-17 DIAGNOSIS — E782 Mixed hyperlipidemia: Secondary | ICD-10-CM

## 2023-04-19 ENCOUNTER — Encounter: Payer: BC Managed Care – PPO | Attending: Internal Medicine | Admitting: *Deleted

## 2023-04-19 DIAGNOSIS — U099 Post covid-19 condition, unspecified: Secondary | ICD-10-CM | POA: Insufficient documentation

## 2023-04-19 DIAGNOSIS — J8410 Pulmonary fibrosis, unspecified: Secondary | ICD-10-CM | POA: Insufficient documentation

## 2023-04-19 DIAGNOSIS — Z5189 Encounter for other specified aftercare: Secondary | ICD-10-CM | POA: Insufficient documentation

## 2023-04-19 NOTE — Progress Notes (Signed)
Initial phone call completed. Diagnosis can be found in [facility] 10/31. EP Orientation scheduled for Wednesday 11/13 at 1pm.

## 2023-04-25 ENCOUNTER — Ambulatory Visit
Admission: RE | Admit: 2023-04-25 | Discharge: 2023-04-25 | Disposition: A | Discharge status: Home / Self Care | Payer: Medicare Other | Source: Ambulatory Visit | Attending: Internal Medicine | Admitting: Internal Medicine

## 2023-04-25 VITALS — Ht 60.0 in | Wt 149.9 lb

## 2023-04-25 DIAGNOSIS — E782 Mixed hyperlipidemia: Secondary | ICD-10-CM | POA: Insufficient documentation

## 2023-04-25 DIAGNOSIS — J8410 Pulmonary fibrosis, unspecified: Secondary | ICD-10-CM

## 2023-04-25 DIAGNOSIS — Z0000 Encounter for general adult medical examination without abnormal findings: Secondary | ICD-10-CM | POA: Insufficient documentation

## 2023-04-25 DIAGNOSIS — Z5189 Encounter for other specified aftercare: Secondary | ICD-10-CM | POA: Diagnosis not present

## 2023-04-25 DIAGNOSIS — U099 Post covid-19 condition, unspecified: Secondary | ICD-10-CM | POA: Diagnosis not present

## 2023-04-25 NOTE — Patient Instructions (Signed)
Patient Instructions  Patient Details  Name: Denise Mason MRN: [id number] Date of Birth: 1956-06-10 Referring Provider:  Danella Penton, MD  Below are your personal goals for exercise, nutrition, and risk factors. Our goal is to help you stay on track towards obtaining and maintaining these goals. We will be discussing your progress on these goals with you throughout the program.  Initial Exercise Prescription:  Initial Exercise Prescription - 04/25/23 1500       Date of Initial Exercise RX and Referring Provider   Date 04/25/23    Referring Provider Bethann Punches, MD      Oxygen   Maintain Oxygen Saturation 88% or higher      Treadmill   MPH 2.3    Grade 0    Minutes 15    METs 2.76      Elliptical   Level 1   Try Elliptical   Speed 3    Minutes 15    METs 2.81      REL-XR   Level 2    Speed 50    Minutes 15    METs 2.81      T5 Nustep   Level 2    SPM 80    Minutes 15    METs 2.81      Track   Laps 33    Minutes 15    METs 2.79      Prescription Details   Frequency (times per week) 3    Duration Progress to 30 minutes of continuous aerobic without signs/symptoms of physical distress      Intensity   THRR 40-80% of Max Heartrate 114-141    Ratings of Perceived Exertion 11-13    Perceived Dyspnea 0-4      Progression   Progression Continue to progress workloads to maintain intensity without signs/symptoms of physical distress.      Resistance Training   Training Prescription Yes    Weight 2 lb    Reps 10-15             Exercise Goals: Frequency: Be able to perform aerobic exercise two to three times per week in program working toward 2-5 days per week of home exercise.  Intensity: Work with a perceived exertion of 11 (fairly light) - 15 (hard) while following your exercise prescription.  We will make changes to your prescription with you as you progress through the program.   Duration: Be able to do 30 to 45 minutes of continuous aerobic  exercise in addition to a 5 minute warm-up and a 5 minute cool-down routine.   Nutrition Goals: Your personal nutrition goals will be established when you do your nutrition analysis with the dietician.  The following are general nutrition guidelines to follow: Cholesterol < 200mg /day Sodium < 1500mg /day Fiber: Women over 50 yrs - 21 grams per day  Personal Goals:  Personal Goals and Risk Factors at Admission - 04/25/23 1529       Core Components/Risk Factors/Patient Goals on Admission    Weight Management Yes;Weight Loss    Intervention Weight Management: Develop a combined nutrition and exercise program designed to reach desired caloric intake, while maintaining appropriate intake of nutrient and fiber, sodium and fats, and appropriate energy expenditure required for the weight goal.;Weight Management: Provide education and appropriate resources to help participant work on and attain dietary goals.;Weight Management/Obesity: Establish reasonable short term and long term weight goals.    Admit Weight 149 lb 14.4 oz (68 kg)    Goal  Weight: Short Term 144 lb (65.3 kg)    Goal Weight: Long Term 139 lb (63 kg)    Expected Outcomes Short Term: Continue to assess and modify interventions until short term weight is achieved;Long Term: Adherence to nutrition and physical activity/exercise program aimed toward attainment of established weight goal;Weight Loss: Understanding of general recommendations for a balanced deficit meal plan, which promotes 1-2 lb weight loss per week and includes a negative energy balance of [phone] kcal/d;Understanding recommendations for meals to include 15-35% energy as protein, 25-35% energy from fat, 35-60% energy from carbohydrates, less than 200mg  of dietary cholesterol, 20-35 gm of total fiber daily;Understanding of distribution of calorie intake throughout the day with the consumption of 4-5 meals/snacks    Improve shortness of breath with ADL's Yes    Intervention  Provide education, individualized exercise plan and daily activity instruction to help decrease symptoms of SOB with activities of daily living.    Expected Outcomes Short Term: Improve cardiorespiratory fitness to achieve a reduction of symptoms when performing ADLs;Long Term: Be able to perform more ADLs without symptoms or delay the onset of symptoms    Lipids Yes    Intervention Provide education and support for participant on nutrition & aerobic/resistive exercise along with prescribed medications to achieve LDL 70mg , HDL >40mg .    Expected Outcomes Short Term: Participant states understanding of desired cholesterol values and is compliant with medications prescribed. Participant is following exercise prescription and nutrition guidelines.;Long Term: Cholesterol controlled with medications as prescribed, with individualized exercise RX and with personalized nutrition plan. Value goals: LDL < 70mg , HDL > 40 mg.             Tobacco Use Initial Evaluation: Social History   Tobacco Use  Smoking Status Former   Current packs/day: 0.00   Average packs/day: 1 pack/day for 30.0 years (30.0 ttl pk-yrs)   Types: Cigarettes   Start date: 03/05/1983   Quit date: 06/12/2012   Years since quitting: 10.8  Smokeless Tobacco Never  Tobacco Comments   1 ppd for 30 years     Exercise Goals and Review:  Exercise Goals     Row Name 04/25/23 1522             Exercise Goals   Increase Physical Activity Yes       Intervention Provide advice, education, support and counseling about physical activity/exercise needs.;Develop an individualized exercise prescription for aerobic and resistive training based on initial evaluation findings, risk stratification, comorbidities and participant's personal goals.       Expected Outcomes Short Term: Attend rehab on a regular basis to increase amount of physical activity.;Long Term: Add in home exercise to make exercise part of routine and to increase amount of  physical activity.;Long Term: Exercising regularly at least 3-5 days a week.       Increase Strength and Stamina Yes       Intervention Provide advice, education, support and counseling about physical activity/exercise needs.;Develop an individualized exercise prescription for aerobic and resistive training based on initial evaluation findings, risk stratification, comorbidities and participant's personal goals.       Expected Outcomes Short Term: Increase workloads from initial exercise prescription for resistance, speed, and METs.;Short Term: Perform resistance training exercises routinely during rehab and add in resistance training at home;Long Term: Improve cardiorespiratory fitness, muscular endurance and strength as measured by increased METs and functional capacity ( )       Able to understand and use rate of perceived exertion (RPE) scale Yes  Intervention Provide education and explanation on how to use RPE scale       Expected Outcomes Short Term: Able to use RPE daily in rehab to express subjective intensity level;Long Term:  Able to use RPE to guide intensity level when exercising independently       Able to understand and use Dyspnea scale Yes       Intervention Provide education and explanation on how to use Dyspnea scale       Expected Outcomes Short Term: Able to use Dyspnea scale daily in rehab to express subjective sense of shortness of breath during exertion;Long Term: Able to use Dyspnea scale to guide intensity level when exercising independently       Knowledge and understanding of Target Heart Rate Range (THRR) Yes       Intervention Provide education and explanation of THRR including how the numbers were predicted and where they are located for reference       Expected Outcomes Short Term: Able to state/look up THRR;Short Term: Able to use daily as guideline for intensity in rehab;Long Term: Able to use THRR to govern intensity when exercising independently       Able to  check pulse independently Yes       Intervention Provide education and demonstration on how to check pulse in carotid and radial arteries.;Review the importance of being able to check your own pulse for safety during independent exercise       Expected Outcomes Short Term: Able to explain why pulse checking is important during independent exercise;Long Term: Able to check pulse independently and accurately       Understanding of Exercise Prescription Yes       Intervention Provide education, explanation, and written materials on patient's individual exercise prescription       Expected Outcomes Short Term: Able to explain program exercise prescription;Long Term: Able to explain home exercise prescription to exercise independently

## 2023-04-25 NOTE — Progress Notes (Signed)
Pulmonary Individual Treatment Plan  Patient Details  Name: Denise Mason MRN: [id number] Date of Birth: 19-Sep-1955 Referring Provider:   Flowsheet Row Pulmonary Rehab from 04/25/2023 in [facility] Cardiac and Pulmonary Rehab  Referring Provider Bethann Punches, MD       Initial Encounter Date:  Flowsheet Row Pulmonary Rehab from 04/25/2023 in [facility] Cardiac and Pulmonary Rehab  Date 04/25/23       Visit Diagnosis: Pulmonary fibrosis (HCC)  Patient's Home Medications on Admission:  Current Outpatient Medications:    albuterol (ACCUNEB) 1.25 MG/3ML nebulizer solution, Take 1 ampule by nebulization every 6 (six) hours as needed., Disp: , Rfl:    albuterol (VENTOLIN HFA) 108 (90 Base) MCG/ACT inhaler, Inhale into the lungs., Disp: , Rfl:    alendronate (FOSAMAX) 70 MG tablet, Take 1 tablet (70 mg total) by mouth every 7 (seven) days. Take with a full glass of water on an empty stomach., Disp: 12 tablet, Rfl: 3   ALPRAZolam (XANAX) 0.5 MG tablet, Take 1 tablet (0.5 mg total) by mouth 2 (two) times daily as needed. for anxiety (Patient not taking: Reported on 04/19/2023), Disp: 10 tablet, Rfl: 0   ammonium lactate (LAC-HYDRIN) 12 % lotion, APPLY TOPICALLY AS NEEDED FOR DRY SKIN (Patient not taking: Reported on 04/19/2023), Disp: 400 g, Rfl: 0   ARMOUR THYROID 30 MG tablet, TAKE 1 TABLET BY MOUTH EVERY DAY EXCEPT TAKE 1 1/2 TABLETS ON SUNDAY (Patient not taking: Reported on 04/19/2023), Disp: 90 tablet, Rfl: 2   aspirin EC 81 MG tablet, Take 1 tablet (81 mg total) by mouth daily. (Patient not taking: Reported on 04/19/2023), Disp: 30 tablet, Rfl: 5   Coenzyme Q10 (COQ-10) 100 MG CAPS, Take 1 capsule by mouth 3 (three) times a week. (Patient not taking: Reported on 04/19/2023), Disp: 100 capsule, Rfl: 0   DULoxetine (CYMBALTA) 60 MG capsule, Take 1 capsule (60 mg total) by mouth daily. (Patient not taking: Reported on 04/19/2023), Disp: 90 capsule, Rfl: 1   ezetimibe (ZETIA) 10 MG tablet, Take 1 tablet (10  mg total) by mouth daily. For cholesterol (Patient not taking: Reported on 04/19/2023), Disp: 90 tablet, Rfl: 1   Fluticasone-Umeclidin-Vilant (TRELEGY ELLIPTA) 100-62.5-25 MCG/ACT AEPB, Inhale into the lungs., Disp: , Rfl:    gabapentin (NEURONTIN) 300 MG capsule, Take 1 capsule (300 mg total) by mouth at bedtime. (Patient not taking: Reported on 04/19/2023), Disp: 90 capsule, Rfl: 1   hydrOXYzine (ATARAX/VISTARIL) 10 MG tablet, Take 1 tablet (10 mg total) by mouth 3 (three) times daily as needed. (Patient not taking: Reported on 04/19/2023), Disp: 30 tablet, Rfl: 0   Krill Oil Omega-3 500 MG CAPS, Take 1 capsule by mouth daily. (Patient not taking: Reported on 04/19/2023), Disp: 30 capsule, Rfl: 0   levothyroxine (SYNTHROID) 50 MCG tablet, Take by mouth., Disp: , Rfl:    nitroGLYCERIN (NITROSTAT) 0.4 MG SL tablet, Place 1 tablet (0.4 mg total) under the tongue every 5 (five) minutes as needed for chest pain. (Patient not taking: Reported on 04/19/2023), Disp: 20 tablet, Rfl: 2   PREMARIN vaginal cream, INSERT 1 APPLICATORFUL VAGINALLY 2 TIMES A WEEK (Patient not taking: Reported on 04/19/2023), Disp: 30 g, Rfl: 10   rosuvastatin (CRESTOR) 10 MG tablet, Take 1 tablet (10 mg total) by mouth 3 (three) times a week. TAKE 1 TABLET(10 MG) BY MOUTH DAILY, Disp: 36 tablet, Rfl: 1   valACYclovir (VALTREX) 1000 MG tablet, Take 1 tablet (1,000 mg total) by mouth 2 (two) times daily. (Patient not taking: Reported on  02/26/2020), Disp: 10 tablet, Rfl: 0  Past Medical History: Past Medical History:  Diagnosis Date   ADD (attention deficit disorder with hyperactivity)    CAD (coronary artery disease)    mild, non-obstructive. by cath 02/16/09 (luminal irreg mid LAD and mid RCA)    Cancer (HCC)    basil   Depression    HLD (hyperlipidemia)    Hypothyroidism    Osteopenia     Tobacco Use: Social History   Tobacco Use  Smoking Status Former   Current packs/day: 0.00   Average packs/day: 1 pack/day for 30.0  years (30.0 ttl pk-yrs)   Types: Cigarettes   Start date: 03/05/1983   Quit date: 06/12/2012   Years since quitting: 10.8  Smokeless Tobacco Never  Tobacco Comments   1 ppd for 30 years     Labs: Review Flowsheet  More data exists      Latest Ref Rng & Units 03/08/2015 03/31/2016 11/21/2016 11/27/2017 06/27/2019  Labs for ITP Cardiac and Pulmonary Rehab  Cholestrol <200 mg/dL 295  284  132  440  102   LDL (calc) mg/dL (calc) 725  366  440  347  156   HDL-C > OR = 50 mg/dL 48  50  52  53  43   Trlycerides <150 mg/dL 425  956  387  564  332   Hemoglobin A1c <5.7 % of total Hgb - 5.6  - 5.5  5.8     Details             Pulmonary Assessment Scores:  Pulmonary Assessment Scores     Row Name 04/25/23 1523         ADL UCSD   ADL Phase Entry     SOB Score total 48     Rest 1     Walk 2     Stairs 5     Bath 0     Dress 0     Shop 2       CAT Score   CAT Score 17       mMRC Score   mMRC Score 1              UCSD: Self-administered rating of dyspnea associated with activities of daily living (ADLs) 6-point scale (0 = "not at all" to 5 = "maximal or unable to do because of breathlessness")  Scoring Scores range from 0 to 120.  Minimally important difference is 5 units  CAT: CAT can identify the health impairment of COPD patients and is better correlated with disease progression.  CAT has a scoring range of zero to 40. The CAT score is classified into four groups of low (less than 10), medium (10 - 20), high (21-30) and very high (31-40) based on the impact level of disease on health status. A CAT score over 10 suggests significant symptoms.  A worsening CAT score could be explained by an exacerbation, poor medication adherence, poor inhaler technique, or progression of COPD or comorbid conditions.  CAT MCID is 2 points  mMRC: mMRC (Modified Medical Research Council) Dyspnea Scale is used to assess the degree of baseline functional disability in patients of  respiratory disease due to dyspnea. No minimal important difference is established. A decrease in score of 1 point or greater is considered a positive change.   Pulmonary Function Assessment:   Exercise Target Goals: Exercise Program Goal: Individual exercise prescription set using results from initial 6 min walk test and THRR while considering  patient's activity barriers and safety.   Exercise Prescription Goal: Initial exercise prescription builds to 30-45 minutes a day of aerobic activity, 2-3 days per week.  Home exercise guidelines will be given to patient during program as part of exercise prescription that the participant will acknowledge.  Education: Aerobic Exercise: - Group verbal and visual presentation on the components of exercise prescription. Introduces F.I.T.T principle from ACSM for exercise prescriptions.  Reviews F.I.T.T. principles of aerobic exercise including progression. Written material given at graduation.   Education: Resistance Exercise: - Group verbal and visual presentation on the components of exercise prescription. Introduces F.I.T.T principle from ACSM for exercise prescriptions  Reviews F.I.T.T. principles of resistance exercise including progression. Written material given at graduation.    Education: Exercise & Equipment Safety: - Individual verbal instruction and demonstration of equipment use and safety with use of the equipment. Flowsheet Row Pulmonary Rehab from 04/25/2023 in [facility] Cardiac and Pulmonary Rehab  Date 04/25/23  Educator MB  Instruction Review Code 1- Verbalizes Understanding       Education: Exercise Physiology & General Exercise Guidelines: - Group verbal and written instruction with models to review the exercise physiology of the cardiovascular system and associated critical values. Provides general exercise guidelines with specific guidelines to those with heart or lung disease.    Education: Flexibility, Balance, Mind/Body  Relaxation: - Group verbal and visual presentation with interactive activity on the components of exercise prescription. Introduces F.I.T.T principle from ACSM for exercise prescriptions. Reviews F.I.T.T. principles of flexibility and balance exercise training including progression. Also discusses the mind body connection.  Reviews various relaxation techniques to help reduce and manage stress (i.e. Deep breathing, progressive muscle relaxation, and visualization). Balance handout provided to take home. Written material given at graduation.   Activity Barriers & Risk Stratification:  Activity Barriers & Cardiac Risk Stratification - 04/25/23 1518       Activity Barriers & Cardiac Risk Stratification   Activity Barriers Joint Problems;Shortness of Breath;Other (comment)    Comments Osteoporosis             6 Minute Walk:  6 Minute Walk     Row Name 04/25/23 1516         6 Minute Walk   Phase Initial     Distance 1225 feet     Walk Time 6 minutes     # of Rest Breaks 0     MPH 2.3     METS 2.81     RPE 11     Perceived Dyspnea  1     VO2 Peak 9.83     Symptoms No     Resting HR 88 bpm     Resting BP 110/70     Resting Oxygen Saturation  96 %     Exercise Oxygen Saturation  during 6 min walk 87 %     Max Ex. HR 116 bpm     Max Ex. BP 112/76     2 Minute Post BP 108/72       Interval HR   1 Minute HR 110     2 Minute HR 112     3 Minute HR 112     4 Minute HR 113     5 Minute HR 116     6 Minute HR 114     2 Minute Post HR 95     Interval Heart Rate? Yes       Interval Oxygen   Interval Oxygen? Yes  Baseline Oxygen Saturation % 96 %     1 Minute Oxygen Saturation % 93 %     1 Minute Liters of Oxygen 0 L     2 Minute Oxygen Saturation % 87 %     2 Minute Liters of Oxygen 0 L     3 Minute Oxygen Saturation % 87 %     3 Minute Liters of Oxygen 0 L     4 Minute Oxygen Saturation % 87 %     4 Minute Liters of Oxygen 0 L     5 Minute Oxygen Saturation % 88 %      5 Minute Liters of Oxygen 0 L     6 Minute Oxygen Saturation % 88 %     6 Minute Liters of Oxygen 0 L     2 Minute Post Oxygen Saturation % 95 %     2 Minute Post Liters of Oxygen 0 L             Oxygen Initial Assessment:  Oxygen Initial Assessment - 04/19/23 1335       Home Oxygen   Home Oxygen Device None    Sleep Oxygen Prescription None    Home Exercise Oxygen Prescription None    Home Resting Oxygen Prescription None    Compliance with Home Oxygen Use Yes      Intervention   Short Term Goals To learn and understand importance of monitoring SPO2 with pulse oximeter and demonstrate accurate use of the pulse oximeter.;To learn and understand importance of maintaining oxygen saturations>88%;To learn and demonstrate proper pursed lip breathing techniques or other breathing techniques. ;To learn and demonstrate proper use of respiratory medications    Long  Term Goals Verbalizes importance of monitoring SPO2 with pulse oximeter and return demonstration;Maintenance of O2 saturations>88%;Exhibits proper breathing techniques, such as pursed lip breathing or other method taught during program session;Compliance with respiratory medication;Demonstrates proper use of MDI's             Oxygen Re-Evaluation:   Oxygen Discharge (Final Oxygen Re-Evaluation):   Initial Exercise Prescription:  Initial Exercise Prescription - 04/25/23 1500       Date of Initial Exercise RX and Referring Provider   Date 04/25/23    Referring Provider Bethann Punches, MD      Oxygen   Maintain Oxygen Saturation 88% or higher      Treadmill   MPH 2.3    Grade 0    Minutes 15    METs 2.76      Elliptical   Level 1   Try Elliptical   Speed 3    Minutes 15    METs 2.81      REL-XR   Level 2    Speed 50    Minutes 15    METs 2.81      T5 Nustep   Level 2    SPM 80    Minutes 15    METs 2.81      Track   Laps 33    Minutes 15    METs 2.79      Prescription Details    Frequency (times per week) 3    Duration Progress to 30 minutes of continuous aerobic without signs/symptoms of physical distress      Intensity   THRR 40-80% of Max Heartrate 114-141    Ratings of Perceived Exertion 11-13    Perceived Dyspnea 0-4      Progression   Progression Continue to  progress workloads to maintain intensity without signs/symptoms of physical distress.      Resistance Training   Training Prescription Yes    Weight 2 lb    Reps 10-15             Perform Capillary Blood Glucose checks as needed.  Exercise Prescription Changes:   Exercise Prescription Changes     Row Name 04/25/23 1500             Response to Exercise   Blood Pressure (Admit) 110/70       Blood Pressure (Exercise) 112/76       Blood Pressure (Exit) 108/72       Heart Rate (Admit) 88 bpm       Heart Rate (Exercise) 116 bpm       Heart Rate (Exit) 95 bpm       Oxygen Saturation (Admit) 96 %       Oxygen Saturation (Exercise) 87 %       Oxygen Saturation (Exit) 95 %       Rating of Perceived Exertion (Exercise) 11       Perceived Dyspnea (Exercise) 1       Symptoms none       Comments results       Duration Progress to 30 minutes of  aerobic without signs/symptoms of physical distress       Intensity THRR New         Progression   Progression Continue to progress workloads to maintain intensity without signs/symptoms of physical distress.       Average METs 2.81                Exercise Comments:   Exercise Goals and Review:   Exercise Goals     Row Name 04/25/23 1522             Exercise Goals   Increase Physical Activity Yes       Intervention Provide advice, education, support and counseling about physical activity/exercise needs.;Develop an individualized exercise prescription for aerobic and resistive training based on initial evaluation findings, risk stratification, comorbidities and participant's personal goals.       Expected Outcomes Short  Term: Attend rehab on a regular basis to increase amount of physical activity.;Long Term: Add in home exercise to make exercise part of routine and to increase amount of physical activity.;Long Term: Exercising regularly at least 3-5 days a week.       Increase Strength and Stamina Yes       Intervention Provide advice, education, support and counseling about physical activity/exercise needs.;Develop an individualized exercise prescription for aerobic and resistive training based on initial evaluation findings, risk stratification, comorbidities and participant's personal goals.       Expected Outcomes Short Term: Increase workloads from initial exercise prescription for resistance, speed, and METs.;Short Term: Perform resistance training exercises routinely during rehab and add in resistance training at home;Long Term: Improve cardiorespiratory fitness, muscular endurance and strength as measured by increased METs and functional capacity ( )       Able to understand and use rate of perceived exertion (RPE) scale Yes       Intervention Provide education and explanation on how to use RPE scale       Expected Outcomes Short Term: Able to use RPE daily in rehab to express subjective intensity level;Long Term:  Able to use RPE to guide intensity level when exercising independently       Able to understand  and use Dyspnea scale Yes       Intervention Provide education and explanation on how to use Dyspnea scale       Expected Outcomes Short Term: Able to use Dyspnea scale daily in rehab to express subjective sense of shortness of breath during exertion;Long Term: Able to use Dyspnea scale to guide intensity level when exercising independently       Knowledge and understanding of Target Heart Rate Range (THRR) Yes       Intervention Provide education and explanation of THRR including how the numbers were predicted and where they are located for reference       Expected Outcomes Short Term: Able to  state/look up THRR;Short Term: Able to use daily as guideline for intensity in rehab;Long Term: Able to use THRR to govern intensity when exercising independently       Able to check pulse independently Yes       Intervention Provide education and demonstration on how to check pulse in carotid and radial arteries.;Review the importance of being able to check your own pulse for safety during independent exercise       Expected Outcomes Short Term: Able to explain why pulse checking is important during independent exercise;Long Term: Able to check pulse independently and accurately       Understanding of Exercise Prescription Yes       Intervention Provide education, explanation, and written materials on patient's individual exercise prescription       Expected Outcomes Short Term: Able to explain program exercise prescription;Long Term: Able to explain home exercise prescription to exercise independently                Exercise Goals Re-Evaluation :   Discharge Exercise Prescription (Final Exercise Prescription Changes):  Exercise Prescription Changes - 04/25/23 1500       Response to Exercise   Blood Pressure (Admit) 110/70    Blood Pressure (Exercise) 112/76    Blood Pressure (Exit) 108/72    Heart Rate (Admit) 88 bpm    Heart Rate (Exercise) 116 bpm    Heart Rate (Exit) 95 bpm    Oxygen Saturation (Admit) 96 %    Oxygen Saturation (Exercise) 87 %    Oxygen Saturation (Exit) 95 %    Rating of Perceived Exertion (Exercise) 11    Perceived Dyspnea (Exercise) 1    Symptoms none    Comments results    Duration Progress to 30 minutes of  aerobic without signs/symptoms of physical distress    Intensity THRR New      Progression   Progression Continue to progress workloads to maintain intensity without signs/symptoms of physical distress.    Average METs 2.81             Nutrition:  Target Goals: Understanding of nutrition guidelines, daily intake of sodium 1500mg ,  cholesterol 200mg , calories 30% from fat and 7% or less from saturated fats, daily to have 5 or more servings of fruits and vegetables.  Education: All About Nutrition: -Group instruction provided by verbal, written material, interactive activities, discussions, models, and posters to present general guidelines for heart healthy nutrition including fat, fiber, MyPlate, the role of sodium in heart healthy nutrition, utilization of the nutrition label, and utilization of this knowledge for meal planning. Follow up email sent as well. Written material given at graduation.   Biometrics:  Pre Biometrics - 04/25/23 1523       Pre Biometrics   Height 5' (1.524 m)  Weight 149 lb 14.4 oz (68 kg)    Waist Circumference 40 inches    Hip Circumference 42 inches    Waist to Hip Ratio 0.95 %    BMI (Calculated) 29.28    Single Leg Stand 30 seconds              Nutrition Therapy Plan and Nutrition Goals:  Nutrition Therapy & Goals - 04/25/23 1527       Personal Nutrition Goals   Nutrition Goal Will meet with RD on 11/18      Intervention Plan   Intervention Prescribe, educate and counsel regarding individualized specific dietary modifications aiming towards targeted core components such as weight, hypertension, lipid management, diabetes, heart failure and other comorbidities.;Nutrition handout(s) given to patient.    Expected Outcomes Short Term Goal: Understand basic principles of dietary content, such as calories, fat, sodium, cholesterol and nutrients.;Short Term Goal: A plan has been developed with personal nutrition goals set during dietitian appointment.;Long Term Goal: Adherence to prescribed nutrition plan.             Nutrition Assessments:  MEDIFICTS Score Key: >=70 Need to make dietary changes  40-70 Heart Healthy Diet <= 40 Therapeutic Level Cholesterol Diet  Flowsheet Row Pulmonary Rehab from 04/25/2023 in [facility] Cardiac and Pulmonary Rehab  Picture Your Plate Total  Score on Admission 38      Picture Your Plate Scores: <16 Unhealthy dietary pattern with much room for improvement. 41-50 Dietary pattern unlikely to meet recommendations for good health and room for improvement. 51-60 More healthful dietary pattern, with some room for improvement.  >60 Healthy dietary pattern, although there may be some specific behaviors that could be improved.   Nutrition Goals Re-Evaluation:   Nutrition Goals Discharge (Final Nutrition Goals Re-Evaluation):   Psychosocial: Target Goals: Acknowledge presence or absence of significant depression and/or stress, maximize coping skills, provide positive support system. Participant is able to verbalize types and ability to use techniques and skills needed for reducing stress and depression.   Education: Stress, Anxiety, and Depression - Group verbal and visual presentation to define topics covered.  Reviews how body is impacted by stress, anxiety, and depression.  Also discusses healthy ways to reduce stress and to treat/manage anxiety and depression.  Written material given at graduation.   Education: Sleep Hygiene -Provides group verbal and written instruction about how sleep can affect your health.  Define sleep hygiene, discuss sleep cycles and impact of sleep habits. Review good sleep hygiene tips.    Initial Review & Psychosocial Screening:  Initial Psych Review & Screening - 04/19/23 1340       Initial Review   Current issues with None Identified      Family Dynamics   Good Support System? Yes      Barriers   Psychosocial barriers to participate in program There are no identifiable barriers or psychosocial needs.      Screening Interventions   Interventions Encouraged to exercise;Provide feedback about the scores to participant;To provide support and resources with identified psychosocial needs    Expected Outcomes Long Term Goal: Stressors or current issues are controlled or eliminated.;Short Term  goal: Utilizing psychosocial counselor, staff and physician to assist with identification of specific Stressors or current issues interfering with healing process. Setting desired goal for each stressor or current issue identified.;Short Term goal: Identification and review with participant of any Quality of Life or Depression concerns found by scoring the questionnaire.;Long Term goal: The participant improves quality of Life and PHQ9  Scores as seen by post scores and/or verbalization of changes             Quality of Life Scores:  Scores of 19 and below usually indicate a poorer quality of life in these areas.  A difference of  2-3 points is a clinically meaningful difference.  A difference of 2-3 points in the total score of the Quality of Life Index has been associated with significant improvement in overall quality of life, self-image, physical symptoms, and general health in studies assessing change in quality of life.  PHQ-9: Review Flowsheet  More data exists      04/25/2023 02/26/2020 06/27/2019 10/14/2018 06/25/2018  Depression screen PHQ 2/9  Decreased Interest 0 0 1 1 0  Down, Depressed, Hopeless 0 0 0 0 0  PHQ - 2 Score 0 0 1 1 0  Altered sleeping 3 - 3 3 2   Tired, decreased energy 2 - 0 1 1  Change in appetite 1 - 1 2 1   Feeling bad or failure about yourself  0 - 1 1 0  Trouble concentrating 0 - 0 0 0  Moving slowly or fidgety/restless 0 - 0 0 0  Suicidal thoughts 0 - 0 0 0  PHQ-9 Score 6 - 6 8 4   Difficult doing work/chores Not difficult at all - Not difficult at all Somewhat difficult Not difficult at all    Details           Interpretation of Total Score  Total Score Depression Severity:  1-4 = Minimal depression, 5-9 = Mild depression, 10-14 = Moderate depression, 15-19 = Moderately severe depression, 20-27 = Severe depression   Psychosocial Evaluation and Intervention:  Psychosocial Evaluation - 04/19/23 1355       Psychosocial Evaluation & Interventions    Interventions Encouraged to exercise with the program and follow exercise prescription    Comments Sharis is coming 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 When asked about her mental health, she states  that currently she feels strong and ready to make progress in her health. It did come has as a shock at first when her health declined rapidly, but she feels at peace with it. [place] and to get away from some of the humidity. She misses walking on the beach, but had a hard time doing so after she got sick. They are originally from this area, so she has support locally. She wants to work on her stamina and shortness of breath, which is worse mainly when walking up the stairs like in her condo.    Expected Outcomes Short: attend pulmonary rehab for education and exercise. Long: develop and maintain positive self care habits    Continue Psychosocial Services  Follow up required by staff             Psychosocial Re-Evaluation:   Psychosocial Discharge (Final Psychosocial Re-Evaluation):   Education: Education Goals: Education classes will be provided on a weekly basis, covering required topics. Participant will state understanding/return demonstration of topics presented.  Learning Barriers/Preferences:  Learning Barriers/Preferences - 04/19/23 1347       Learning Barriers/Preferences   Learning Barriers None    Learning Preferences None             General Pulmonary Education Topics:  Infection Prevention: - Provides verbal and written material to individual with discussion of infection control including proper hand washing and proper equipment cleaning  during exercise session. Flowsheet Row Pulmonary Rehab from 04/25/2023 in [facility]  Date 04/25/23  Educator MB  Instruction Review Code 1- Verbalizes Understanding       Falls Prevention: - Provides verbal and written  material to individual with discussion of falls prevention and safety. Flowsheet Row Pulmonary Rehab from 04/25/2023 in [facility] Pulmonary Re[facility]  Date 04/25/23  Educator MB  Instruction Review Code 1- Verbalizes Understanding       Chronic Lung Disease Review: - Group verbal instruction with posters, models, PowerPoint presentations and videos,  to review new updates, new respiratory medications, new advancements in procedures and treatments. Providing information on websites and "800" numbers for continued self-education. Includes information about supplement oxygen, available portable oxygen systems, continuous and intermittent flow rates, oxygen safety, concentrators, and Medicare reimbursement for oxygen. Explanation of Pulmonary Drugs, including class, frequency, complications, importance of spacers, rinsing mouth after steroid MDI's, and proper cleaning methods for nebulizers. Review of basic lung anatomy and physiology related to function, structure, and complications of lung disease. Review of risk factors. Discussion about methods for diagnosing sleep apnea and types of masks and machines for OSA. Includes a review of the use of types of environmental controls: home humidity, furnaces, filters, dust mite/pet prevention, HEPA vacuums. Discussion about weather changes, air quality and the benefits of nasal washing. Instruction on Warning signs, infection symptoms, calling MD promptly, preventive modes, and value of vaccinations. Review of effective airway clearance, coughing and/or vibration techniques. Emphasizing that all should Create an Action Plan. Written material given at graduation. Flowsheet Row Pulmonary Rehab from 04/25/2023 in [facility] Pulmonary Re[facility]  Education need identified 04/25/23       AED/CPR: - Group verbal and written instruction with the use of models to demonstrate the basic use of the AED with the basic ABC's of resuscitation.    Anatomy and  Cardiac Procedures: - Group verbal and visual presentation and models provide information about basic cardiac anatomy and function. Reviews the testing methods done to diagnose heart disease and the outcomes of the test results. Describes the treatment choices: Medical Management, Angioplasty, or Coronary Bypass Surgery for treating various heart conditions including Myocardial Infarction, Angina, Valve Disease, and Cardiac Arrhythmias.  Written material given at graduation.   Medication Safety: - Group verbal and visual instruction to review commonly prescribed medications for heart and lung disease. Reviews the medication, class of the drug, and side effects. Includes the steps to properly store meds and maintain the prescription regimen.  Written material given at graduation.   Other: -Provides group and verbal instruction on various topics (see comments)   Knowledge Questionnaire Score:  Knowledge Questionnaire Score - 04/25/23 1529       Knowledge Questionnaire Score   Pre Score 16/18              Core Components/Risk Factors/Patient Goals at Admission:  Personal Goals and Risk Factors at Admission - 04/25/23 1529       Core Components/Risk Factors/Patient Goals on Admission    Weight Management Yes;Weight Loss    Intervention Weight Management: Develop a combined nutrition and exercise program designed to reach desired caloric intake, while maintaining appropriate intake of nutrient and fiber, sodium and fats, and appropriate energy expenditure required for the weight goal.;Weight Management: Provide education and appropriate resources to help participant work on and attain dietary goals.;Weight Management/Obesity: Establish reasonable short term and long term weight goals.    Admit Weight 149 lb 14.4 oz (68  kg)    Goal Weight: Short Term 144 lb (65.3 kg)    Goal Weight: Long Term 139 lb (63 kg)    Expected Outcomes Short Term: Continue to assess and modify interventions  until short term weight is achieved;Long Term: Adherence to nutrition and physical activity/exercise program aimed toward attainment of established weight goal;Weight Loss: Understanding of general recommendations for a balanced deficit meal plan, which promotes 1-2 lb weight loss per week and includes a negative energy balance of [phone] kcal/d;Understanding recommendations for meals to include 15-35% energy as protein, 25-35% energy from fat, 35-60% energy from carbohydrates, less than 200mg  of dietary cholesterol, 20-35 gm of total fiber daily;Understanding of distribution of calorie intake throughout the day with the consumption of 4-5 meals/snacks    Improve shortness of breath with ADL's Yes    Intervention Provide education, individualized exercise plan and daily activity instruction to help decrease symptoms of SOB with activities of daily living.    Expected Outcomes Short Term: Improve cardiorespiratory fitness to achieve a reduction of symptoms when performing ADLs;Long Term: Be able to perform more ADLs without symptoms or delay the onset of symptoms    Lipids Yes    Intervention Provide education and support for participant on nutrition & aerobic/resistive exercise along with prescribed medications to achieve LDL 70mg , HDL >40mg .    Expected Outcomes Short Term: Participant states understanding of desired cholesterol values and is compliant with medications prescribed. Participant is following exercise prescription and nutrition guidelines.;Long Term: Cholesterol controlled with medications as prescribed, with individualized exercise RX and with personalized nutrition plan. Value goals: LDL < 70mg , HDL > 40 mg.             Education:Diabetes - Individual verbal and written instruction to review signs/symptoms of diabetes, desired ranges of glucose level fasting, after meals and with exercise. Acknowledge that pre and post exercise glucose checks will be done for 3 sessions at entry of  program.   Know Your Numbers and Heart Failure: - Group verbal and visual instruction to discuss disease risk factors for cardiac and pulmonary disease and treatment options.  Reviews associated critical values for Overweight/Obesity, Hypertension, Cholesterol, and Diabetes.  Discusses basics of heart failure: signs/symptoms and treatments.  Introduces Heart Failure Zone chart for action plan for heart failure.  Written material given at graduation.   Core Components/Risk Factors/Patient Goals Review:    Core Components/Risk Factors/Patient Goals at Discharge (Final Review):    ITP Comments:  ITP Comments     Row Name 04/19/23 1345 04/25/23 1515         ITP Comments Initial phone call completed. Diagnosis can be found in [facility] 10/31. EP Orientation scheduled for Wednesday 11/13 at 1pm. Completed and gym orientation. Initial ITP created and sent for review to Dr. Jinny Sanders, Medical Director.               Comments: Initial ITP

## 2023-05-02 ENCOUNTER — Encounter: Payer: Self-pay | Admitting: *Deleted

## 2023-05-02 DIAGNOSIS — J8410 Pulmonary fibrosis, unspecified: Secondary | ICD-10-CM

## 2023-05-02 NOTE — Progress Notes (Signed)
Pulmonary Individual Treatment Plan  Patient Details  Name: Denise Mason MRN: [id number] Date of Birth: [birth year] Referring Provider:   Flowsheet Row Pulmonary Rehab from 04/25/2023 in [facility] Cardiac and Pulmonary Rehab  Referring Provider Bethann Punches, MD       Initial Encounter Date:  Flowsheet Row Pulmonary Rehab from 04/25/2023 in [facility] Cardiac and Pulmonary Rehab  Date 04/25/23       Visit Diagnosis: Pulmonary fibrosis (HCC)  Patient's Home Medications on Admission:  Current Outpatient Medications:    albuterol (ACCUNEB) 1.25 MG/3ML nebulizer solution, Take 1 ampule by nebulization every 6 (six) hours as needed., Disp: , Rfl:    albuterol (VENTOLIN HFA) 108 (90 Base) MCG/ACT inhaler, Inhale into the lungs., Disp: , Rfl:    alendronate (FOSAMAX) 70 MG tablet, Take 1 tablet (70 mg total) by mouth every 7 (seven) days. Take with a full glass of water on an empty stomach., Disp: 12 tablet, Rfl: 3   ALPRAZolam (XANAX) 0.5 MG tablet, Take 1 tablet (0.5 mg total) by mouth 2 (two) times daily as needed. for anxiety (Patient not taking: Reported on 04/19/2023), Disp: 10 tablet, Rfl: 0   ammonium lactate (LAC-HYDRIN) 12 % lotion, APPLY TOPICALLY AS NEEDED FOR DRY SKIN (Patient not taking: Reported on 04/19/2023), Disp: 400 g, Rfl: 0   ARMOUR THYROID 30 MG tablet, TAKE 1 TABLET BY MOUTH EVERY DAY EXCEPT TAKE 1 1/2 TABLETS ON SUNDAY (Patient not taking: Reported on 04/19/2023), Disp: 90 tablet, Rfl: 2   aspirin EC 81 MG tablet, Take 1 tablet (81 mg total) by mouth daily. (Patient not taking: Reported on 04/19/2023), Disp: 30 tablet, Rfl: 5   Coenzyme Q10 (COQ-10) 100 MG CAPS, Take 1 capsule by mouth 3 (three) times a week. (Patient not taking: Reported on 04/19/2023), Disp: 100 capsule, Rfl: 0   DULoxetine (CYMBALTA) 60 MG capsule, Take 1 capsule (60 mg total) by mouth daily. (Patient not taking: Reported on 04/19/2023), Disp: 90 capsule, Rfl: 1   ezetimibe (ZETIA) 10 MG tablet, Take 1 tablet (10  mg total) by mouth daily. For cholesterol (Patient not taking: Reported on 04/19/2023), Disp: 90 tablet, Rfl: 1   Fluticasone-Umeclidin-Vilant (TRELEGY ELLIPTA) 100-62.5-25 MCG/ACT AEPB, Inhale into the lungs., Disp: , Rfl:    gabapentin (NEURONTIN) 300 MG capsule, Take 1 capsule (300 mg total) by mouth at bedtime. (Patient not taking: Reported on 04/19/2023), Disp: 90 capsule, Rfl: 1   hydrOXYzine (ATARAX/VISTARIL) 10 MG tablet, Take 1 tablet (10 mg total) by mouth 3 (three) times daily as needed. (Patient not taking: Reported on 04/19/2023), Disp: 30 tablet, Rfl: 0   Krill Oil Omega-3 500 MG CAPS, Take 1 capsule by mouth daily. (Patient not taking: Reported on 04/19/2023), Disp: 30 capsule, Rfl: 0   levothyroxine (SYNTHROID) 50 MCG tablet, Take by mouth., Disp: , Rfl:    nitroGLYCERIN (NITROSTAT) 0.4 MG SL tablet, Place 1 tablet (0.4 mg total) under the tongue every 5 (five) minutes as needed for chest pain. (Patient not taking: Reported on 04/19/2023), Disp: 20 tablet, Rfl: 2   PREMARIN vaginal cream, INSERT 1 APPLICATORFUL VAGINALLY 2 TIMES A WEEK (Patient not taking: Reported on 04/19/2023), Disp: 30 g, Rfl: 10   rosuvastatin (CRESTOR) 10 MG tablet, Take 1 tablet (10 mg total) by mouth 3 (three) times a week. TAKE 1 TABLET(10 MG) BY MOUTH DAILY, Disp: 36 tablet, Rfl: 1   valACYclovir (VALTREX) 1000 MG tablet, Take 1 tablet (1,000 mg total) by mouth 2 (two) times daily. (Patient not taking: Reported on  02/26/2020), Disp: 10 tablet, Rfl: 0  Past Medical History: Past Medical History:  Diagnosis Date   ADD (attention deficit disorder with hyperactivity)    CAD (coronary artery disease)    mild, non-obstructive. by cath 02/16/09 (luminal irreg mid LAD and mid RCA)    Cancer (HCC)    basil   Depression    HLD (hyperlipidemia)    Hypothyroidism    Osteopenia     Tobacco Use: Social History   Tobacco Use  Smoking Status Former   Current packs/day: 0.00   Average packs/day: 1 pack/day for 30.0  years (30.0 ttl pk-yrs)   Types: Cigarettes   Start date: 03/05/1983   Quit date: 06/12/2012   Years since quitting: 10.8  Smokeless Tobacco Never  Tobacco Comments   1 ppd for 30 years     Labs: Review Flowsheet  More data exists      Latest Ref Rng & Units 03/08/2015 03/31/2016 11/21/2016 11/27/2017 06/27/2019  Labs for ITP Cardiac and Pulmonary Rehab  Cholestrol <200 mg/dL 161  096  045  409  811   LDL (calc) mg/dL (calc) 914  782  956  213  156   HDL-C > OR = 50 mg/dL 48  50  52  53  43   Trlycerides <150 mg/dL 086  578  469  629  528   Hemoglobin A1c <5.7 % of total Hgb - 5.6  - 5.5  5.8     Details             Pulmonary Assessment Scores:  Pulmonary Assessment Scores     Row Name 04/25/23 1523         ADL UCSD   ADL Phase Entry     SOB Score total 48     Rest 1     Walk 2     Stairs 5     Bath 0     Dress 0     Shop 2       CAT Score   CAT Score 17       mMRC Score   mMRC Score 1              UCSD: Self-administered rating of dyspnea associated with activities of daily living (ADLs) 6-point scale (0 = "not at all" to 5 = "maximal or unable to do because of breathlessness")  Scoring Scores range from 0 to 120.  Minimally important difference is 5 units  CAT: CAT can identify the health impairment of COPD patients and is better correlated with disease progression.  CAT has a scoring range of zero to 40. The CAT score is classified into four groups of low (less than 10), medium (10 - 20), high (21-30) and very high (31-40) based on the impact level of disease on health status. A CAT score over 10 suggests significant symptoms.  A worsening CAT score could be explained by an exacerbation, poor medication adherence, poor inhaler technique, or progression of COPD or comorbid conditions.  CAT MCID is 2 points  mMRC: mMRC (Modified Medical Research Council) Dyspnea Scale is used to assess the degree of baseline functional disability in patients of  respiratory disease due to dyspnea. No minimal important difference is established. A decrease in score of 1 point or greater is considered a positive change.   Pulmonary Function Assessment:   Exercise Target Goals: Exercise Program Goal: Individual exercise prescription set using results from initial 6 min walk test and THRR while considering  patient's activity barriers and safety.   Exercise Prescription Goal: Initial exercise prescription builds to 30-45 minutes a day of aerobic activity, 2-3 days per week.  Home exercise guidelines will be given to patient during program as part of exercise prescription that the participant will acknowledge.  Education: Aerobic Exercise: - Group verbal and visual presentation on the components of exercise prescription. Introduces F.I.T.T principle from ACSM for exercise prescriptions.  Reviews F.I.T.T. principles of aerobic exercise including progression. Written material given at graduation.   Education: Resistance Exercise: - Group verbal and visual presentation on the components of exercise prescription. Introduces F.I.T.T principle from ACSM for exercise prescriptions  Reviews F.I.T.T. principles of resistance exercise including progression. Written material given at graduation.    Education: Exercise & Equipment Safety: - Individual verbal instruction and demonstration of equipment use and safety with use of the equipment. Flowsheet Row Pulmonary Rehab from 04/25/2023 in [facility] Cardiac and Pulmonary Rehab  Date 04/25/23  Educator MB  Instruction Review Code 1- Verbalizes Understanding       Education: Exercise Physiology & General Exercise Guidelines: - Group verbal and written instruction with models to review the exercise physiology of the cardiovascular system and associated critical values. Provides general exercise guidelines with specific guidelines to those with heart or lung disease.    Education: Flexibility, Balance, Mind/Body  Relaxation: - Group verbal and visual presentation with interactive activity on the components of exercise prescription. Introduces F.I.T.T principle from ACSM for exercise prescriptions. Reviews F.I.T.T. principles of flexibility and balance exercise training including progression. Also discusses the mind body connection.  Reviews various relaxation techniques to help reduce and manage stress (i.e. Deep breathing, progressive muscle relaxation, and visualization). Balance handout provided to take home. Written material given at graduation.   Activity Barriers & Risk Stratification:  Activity Barriers & Cardiac Risk Stratification - 04/25/23 1518       Activity Barriers & Cardiac Risk Stratification   Activity Barriers Joint Problems;Shortness of Breath;Other (comment)    Comments Osteoporosis             6 Minute Walk:  6 Minute Walk     Row Name 04/25/23 1516         6 Minute Walk   Phase Initial     Distance 1225 feet     Walk Time 6 minutes     # of Rest Breaks 0     MPH 2.3     METS 2.81     RPE 11     Perceived Dyspnea  1     VO2 Peak 9.83     Symptoms No     Resting HR 88 bpm     Resting BP 110/70     Resting Oxygen Saturation  96 %     Exercise Oxygen Saturation  during 6 min walk 87 %     Max Ex. HR 116 bpm     Max Ex. BP 112/76     2 Minute Post BP 108/72       Interval HR   1 Minute HR 110     2 Minute HR 112     3 Minute HR 112     4 Minute HR 113     5 Minute HR 116     6 Minute HR 114     2 Minute Post HR 95     Interval Heart Rate? Yes       Interval Oxygen   Interval Oxygen? Yes  Baseline Oxygen Saturation % 96 %     1 Minute Oxygen Saturation % 93 %     1 Minute Liters of Oxygen 0 L     2 Minute Oxygen Saturation % 87 %     2 Minute Liters of Oxygen 0 L     3 Minute Oxygen Saturation % 87 %     3 Minute Liters of Oxygen 0 L     4 Minute Oxygen Saturation % 87 %     4 Minute Liters of Oxygen 0 L     5 Minute Oxygen Saturation % 88 %      5 Minute Liters of Oxygen 0 L     6 Minute Oxygen Saturation % 88 %     6 Minute Liters of Oxygen 0 L     2 Minute Post Oxygen Saturation % 95 %     2 Minute Post Liters of Oxygen 0 L             Oxygen Initial Assessment:  Oxygen Initial Assessment - 04/19/23 1335       Home Oxygen   Home Oxygen Device None    Sleep Oxygen Prescription None    Home Exercise Oxygen Prescription None    Home Resting Oxygen Prescription None    Compliance with Home Oxygen Use Yes      Intervention   Short Term Goals To learn and understand importance of monitoring SPO2 with pulse oximeter and demonstrate accurate use of the pulse oximeter.;To learn and understand importance of maintaining oxygen saturations>88%;To learn and demonstrate proper pursed lip breathing techniques or other breathing techniques. ;To learn and demonstrate proper use of respiratory medications    Long  Term Goals Verbalizes importance of monitoring SPO2 with pulse oximeter and return demonstration;Maintenance of O2 saturations>88%;Exhibits proper breathing techniques, such as pursed lip breathing or other method taught during program session;Compliance with respiratory medication;Demonstrates proper use of MDI's             Oxygen Re-Evaluation:   Oxygen Discharge (Final Oxygen Re-Evaluation):   Initial Exercise Prescription:  Initial Exercise Prescription - 04/25/23 1500       Date of Initial Exercise RX and Referring Provider   Date 04/25/23    Referring Provider Bethann Punches, MD      Oxygen   Maintain Oxygen Saturation 88% or higher      Treadmill   MPH 2.3    Grade 0    Minutes 15    METs 2.76      Elliptical   Level 1   Try Elliptical   Speed 3    Minutes 15    METs 2.81      REL-XR   Level 2    Speed 50    Minutes 15    METs 2.81      T5 Nustep   Level 2    SPM 80    Minutes 15    METs 2.81      Track   Laps 33    Minutes 15    METs 2.79      Prescription Details    Frequency (times per week) 3    Duration Progress to 30 minutes of continuous aerobic without signs/symptoms of physical distress      Intensity   THRR 40-80% of Max Heartrate 114-141    Ratings of Perceived Exertion 11-13    Perceived Dyspnea 0-4      Progression   Progression Continue to  progress workloads to maintain intensity without signs/symptoms of physical distress.      Resistance Training   Training Prescription Yes    Weight 2 lb    Reps 10-15             Perform Capillary Blood Glucose checks as needed.  Exercise Prescription Changes:   Exercise Prescription Changes     Row Name 04/25/23 1500             Response to Exercise   Blood Pressure (Admit) 110/70       Blood Pressure (Exercise) 112/76       Blood Pressure (Exit) 108/72       Heart Rate (Admit) 88 bpm       Heart Rate (Exercise) 116 bpm       Heart Rate (Exit) 95 bpm       Oxygen Saturation (Admit) 96 %       Oxygen Saturation (Exercise) 87 %       Oxygen Saturation (Exit) 95 %       Rating of Perceived Exertion (Exercise) 11       Perceived Dyspnea (Exercise) 1       Symptoms none       Comments results       Duration Progress to 30 minutes of  aerobic without signs/symptoms of physical distress       Intensity THRR New         Progression   Progression Continue to progress workloads to maintain intensity without signs/symptoms of physical distress.       Average METs 2.81                Exercise Comments:   Exercise Goals and Review:   Exercise Goals     Row Name 04/25/23 1522             Exercise Goals   Increase Physical Activity Yes       Intervention Provide advice, education, support and counseling about physical activity/exercise needs.;Develop an individualized exercise prescription for aerobic and resistive training based on initial evaluation findings, risk stratification, comorbidities and participant's personal goals.       Expected Outcomes Short  Term: Attend rehab on a regular basis to increase amount of physical activity.;Long Term: Add in home exercise to make exercise part of routine and to increase amount of physical activity.;Long Term: Exercising regularly at least 3-5 days a week.       Increase Strength and Stamina Yes       Intervention Provide advice, education, support and counseling about physical activity/exercise needs.;Develop an individualized exercise prescription for aerobic and resistive training based on initial evaluation findings, risk stratification, comorbidities and participant's personal goals.       Expected Outcomes Short Term: Increase workloads from initial exercise prescription for resistance, speed, and METs.;Short Term: Perform resistance training exercises routinely during rehab and add in resistance training at home;Long Term: Improve cardiorespiratory fitness, muscular endurance and strength as measured by increased METs and functional capacity ( )       Able to understand and use rate of perceived exertion (RPE) scale Yes       Intervention Provide education and explanation on how to use RPE scale       Expected Outcomes Short Term: Able to use RPE daily in rehab to express subjective intensity level;Long Term:  Able to use RPE to guide intensity level when exercising independently       Able to understand  and use Dyspnea scale Yes       Intervention Provide education and explanation on how to use Dyspnea scale       Expected Outcomes Short Term: Able to use Dyspnea scale daily in rehab to express subjective sense of shortness of breath during exertion;Long Term: Able to use Dyspnea scale to guide intensity level when exercising independently       Knowledge and understanding of Target Heart Rate Range (THRR) Yes       Intervention Provide education and explanation of THRR including how the numbers were predicted and where they are located for reference       Expected Outcomes Short Term: Able to  state/look up THRR;Short Term: Able to use daily as guideline for intensity in rehab;Long Term: Able to use THRR to govern intensity when exercising independently       Able to check pulse independently Yes       Intervention Provide education and demonstration on how to check pulse in carotid and radial arteries.;Review the importance of being able to check your own pulse for safety during independent exercise       Expected Outcomes Short Term: Able to explain why pulse checking is important during independent exercise;Long Term: Able to check pulse independently and accurately       Understanding of Exercise Prescription Yes       Intervention Provide education, explanation, and written materials on patient's individual exercise prescription       Expected Outcomes Short Term: Able to explain program exercise prescription;Long Term: Able to explain home exercise prescription to exercise independently                Exercise Goals Re-Evaluation :   Discharge Exercise Prescription (Final Exercise Prescription Changes):  Exercise Prescription Changes - 04/25/23 1500       Response to Exercise   Blood Pressure (Admit) 110/70    Blood Pressure (Exercise) 112/76    Blood Pressure (Exit) 108/72    Heart Rate (Admit) 88 bpm    Heart Rate (Exercise) 116 bpm    Heart Rate (Exit) 95 bpm    Oxygen Saturation (Admit) 96 %    Oxygen Saturation (Exercise) 87 %    Oxygen Saturation (Exit) 95 %    Rating of Perceived Exertion (Exercise) 11    Perceived Dyspnea (Exercise) 1    Symptoms none    Comments results    Duration Progress to 30 minutes of  aerobic without signs/symptoms of physical distress    Intensity THRR New      Progression   Progression Continue to progress workloads to maintain intensity without signs/symptoms of physical distress.    Average METs 2.81             Nutrition:  Target Goals: Understanding of nutrition guidelines, daily intake of sodium 1500mg ,  cholesterol 200mg , calories 30% from fat and 7% or less from saturated fats, daily to have 5 or more servings of fruits and vegetables.  Education: All About Nutrition: -Group instruction provided by verbal, written material, interactive activities, discussions, models, and posters to present general guidelines for heart healthy nutrition including fat, fiber, MyPlate, the role of sodium in heart healthy nutrition, utilization of the nutrition label, and utilization of this knowledge for meal planning. Follow up email sent as well. Written material given at graduation.   Biometrics:  Pre Biometrics - 04/25/23 1523       Pre Biometrics   Height 5' (1.524 m)  Weight 149 lb 14.4 oz (68 kg)    Waist Circumference 40 inches    Hip Circumference 42 inches    Waist to Hip Ratio 0.95 %    BMI (Calculated) 29.28    Single Leg Stand 30 seconds              Nutrition Therapy Plan and Nutrition Goals:  Nutrition Therapy & Goals - 04/25/23 1527       Personal Nutrition Goals   Nutrition Goal Will meet with RD on 11/18      Intervention Plan   Intervention Prescribe, educate and counsel regarding individualized specific dietary modifications aiming towards targeted core components such as weight, hypertension, lipid management, diabetes, heart failure and other comorbidities.;Nutrition handout(s) given to patient.    Expected Outcomes Short Term Goal: Understand basic principles of dietary content, such as calories, fat, sodium, cholesterol and nutrients.;Short Term Goal: A plan has been developed with personal nutrition goals set during dietitian appointment.;Long Term Goal: Adherence to prescribed nutrition plan.             Nutrition Assessments:  MEDIFICTS Score Key: >=70 Need to make dietary changes  40-70 Heart Healthy Diet <= 40 Therapeutic Level Cholesterol Diet  Flowsheet Row Pulmonary Rehab from 04/25/2023 in [facility] Cardiac and Pulmonary Rehab  Picture Your Plate Total  Score on Admission 38      Picture Your Plate Scores: <21 Unhealthy dietary pattern with much room for improvement. 41-50 Dietary pattern unlikely to meet recommendations for good health and room for improvement. 51-60 More healthful dietary pattern, with some room for improvement.  >60 Healthy dietary pattern, although there may be some specific behaviors that could be improved.   Nutrition Goals Re-Evaluation:   Nutrition Goals Discharge (Final Nutrition Goals Re-Evaluation):   Psychosocial: Target Goals: Acknowledge presence or absence of significant depression and/or stress, maximize coping skills, provide positive support system. Participant is able to verbalize types and ability to use techniques and skills needed for reducing stress and depression.   Education: Stress, Anxiety, and Depression - Group verbal and visual presentation to define topics covered.  Reviews how body is impacted by stress, anxiety, and depression.  Also discusses healthy ways to reduce stress and to treat/manage anxiety and depression.  Written material given at graduation.   Education: Sleep Hygiene -Provides group verbal and written instruction about how sleep can affect your health.  Define sleep hygiene, discuss sleep cycles and impact of sleep habits. Review good sleep hygiene tips.    Initial Review & Psychosocial Screening:  Initial Psych Review & Screening - 04/19/23 1340       Initial Review   Current issues with None Identified      Family Dynamics   Good Support System? Yes      Barriers   Psychosocial barriers to participate in program There are no identifiable barriers or psychosocial needs.      Screening Interventions   Interventions Encouraged to exercise;Provide feedback about the scores to participant;To provide support and resources with identified psychosocial needs    Expected Outcomes Long Term Goal: Stressors or current issues are controlled or eliminated.;Short Term  goal: Utilizing psychosocial counselor, staff and physician to assist with identification of specific Stressors or current issues interfering with healing process. Setting desired goal for each stressor or current issue identified.;Short Term goal: Identification and review with participant of any Quality of Life or Depression concerns found by scoring the questionnaire.;Long Term goal: The participant improves quality of Life and PHQ9  Scores as seen by post scores and/or verbalization of changes             Quality of Life Scores:  Scores of 19 and below usually indicate a poorer quality of life in these areas.  A difference of  2-3 points is a clinically meaningful difference.  A difference of 2-3 points in the total score of the Quality of Life Index has been associated with significant improvement in overall quality of life, self-image, physical symptoms, and general health in studies assessing change in quality of life.  PHQ-9: Review Flowsheet  More data exists      04/25/2023 02/26/2020 06/27/2019 10/14/2018 06/25/2018  Depression screen PHQ 2/9  Decreased Interest 0 0 1 1 0  Down, Depressed, Hopeless 0 0 0 0 0  PHQ - 2 Score 0 0 1 1 0  Altered sleeping 3 - 3 3 2   Tired, decreased energy 2 - 0 1 1  Change in appetite 1 - 1 2 1   Feeling bad or failure about yourself  0 - 1 1 0  Trouble concentrating 0 - 0 0 0  Moving slowly or fidgety/restless 0 - 0 0 0  Suicidal thoughts 0 - 0 0 0  PHQ-9 Score 6 - 6 8 4   Difficult doing work/chores Not difficult at all - Not difficult at all Somewhat difficult Not difficult at all    Details           Interpretation of Total Score  Total Score Depression Severity:  1-4 = Minimal depression, 5-9 = Mild depression, 10-14 = Moderate depression, 15-19 = Moderately severe depression, 20-27 = Severe depression   Psychosocial Evaluation and Intervention:  Psychosocial Evaluation - 04/19/23 1355       Psychosocial Evaluation & Interventions    Interventions Encouraged to exercise with the program and follow exercise prescription    Comments Marciela is coming 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 When asked about her mental health, she states  that currently she feels strong and ready to make progress in her health. It did come has as a shock at first when her health declined rapidly, but she feels at peace with it. [place] and to get away from some of the humidity. She misses walking on the beach, but had a hard time doing so after she got sick. They are originally from this area, so she has support locally. She wants to work on her stamina and shortness of breath, which is worse mainly when walking up the stairs like in her condo.    Expected Outcomes Short: attend pulmonary rehab for education and exercise. Long: develop and maintain positive self care habits    Continue Psychosocial Services  Follow up required by staff             Psychosocial Re-Evaluation:   Psychosocial Discharge (Final Psychosocial Re-Evaluation):   Education: Education Goals: Education classes will be provided on a weekly basis, covering required topics. Participant will state understanding/return demonstration of topics presented.  Learning Barriers/Preferences:  Learning Barriers/Preferences - 04/19/23 1347       Learning Barriers/Preferences   Learning Barriers None    Learning Preferences None             General Pulmonary Education Topics:  Infection Prevention: - Provides verbal and written material to individual with discussion of infection control including proper hand washing and proper equipment cleaning  during exercise session. Flowsheet Row Pulmonary Rehab from 04/25/2023 in [facility] Cardiac and Pulmonary Rehab  Date 04/25/23  Educator MB  Instruction Review Code 1- Verbalizes Understanding       Falls Prevention: - Provides verbal and written  material to individual with discussion of falls prevention and safety. Flowsheet Row Pulmonary Rehab from 04/25/2023 in [facility] Cardiac and Pulmonary Rehab  Date 04/25/23  Educator MB  Instruction Review Code 1- Verbalizes Understanding       Chronic Lung Disease Review: - Group verbal instruction with posters, models, PowerPoint presentations and videos,  to review new updates, new respiratory medications, new advancements in procedures and treatments. Providing information on websites and "800" numbers for continued self-education. Includes information about supplement oxygen, available portable oxygen systems, continuous and intermittent flow rates, oxygen safety, concentrators, and Medicare reimbursement for oxygen. Explanation of Pulmonary Drugs, including class, frequency, complications, importance of spacers, rinsing mouth after steroid MDI's, and proper cleaning methods for nebulizers. Review of basic lung anatomy and physiology related to function, structure, and complications of lung disease. Review of risk factors. Discussion about methods for diagnosing sleep apnea and types of masks and machines for OSA. Includes a review of the use of types of environmental controls: home humidity, furnaces, filters, dust mite/pet prevention, HEPA vacuums. Discussion about weather changes, air quality and the benefits of nasal washing. Instruction on Warning signs, infection symptoms, calling MD promptly, preventive modes, and value of vaccinations. Review of effective airway clearance, coughing and/or vibration techniques. Emphasizing that all should Create an Action Plan. Written material given at graduation. Flowsheet Row Pulmonary Rehab from 04/25/2023 in [facility] Cardiac and Pulmonary Rehab  Education need identified 04/25/23       AED/CPR: - Group verbal and written instruction with the use of models to demonstrate the basic use of the AED with the basic ABC's of resuscitation.    Anatomy and  Cardiac Procedures: - Group verbal and visual presentation and models provide information about basic cardiac anatomy and function. Reviews the testing methods done to diagnose heart disease and the outcomes of the test results. Describes the treatment choices: Medical Management, Angioplasty, or Coronary Bypass Surgery for treating various heart conditions including Myocardial Infarction, Angina, Valve Disease, and Cardiac Arrhythmias.  Written material given at graduation.   Medication Safety: - Group verbal and visual instruction to review commonly prescribed medications for heart and lung disease. Reviews the medication, class of the drug, and side effects. Includes the steps to properly store meds and maintain the prescription regimen.  Written material given at graduation.   Other: -Provides group and verbal instruction on various topics (see comments)   Knowledge Questionnaire Score:  Knowledge Questionnaire Score - 04/25/23 1529       Knowledge Questionnaire Score   Pre Score 16/18              Core Components/Risk Factors/Patient Goals at Admission:  Personal Goals and Risk Factors at Admission - 04/25/23 1529       Core Components/Risk Factors/Patient Goals on Admission    Weight Management Yes;Weight Loss    Intervention Weight Management: Develop a combined nutrition and exercise program designed to reach desired caloric intake, while maintaining appropriate intake of nutrient and fiber, sodium and fats, and appropriate energy expenditure required for the weight goal.;Weight Management: Provide education and appropriate resources to help participant work on and attain dietary goals.;Weight Management/Obesity: Establish reasonable short term and long term weight goals.    Admit Weight 149 lb 14.4 oz (68  kg)    Goal Weight: Short Term 144 lb (65.3 kg)    Goal Weight: Long Term 139 lb (63 kg)    Expected Outcomes Short Term: Continue to assess and modify interventions  until short term weight is achieved;Long Term: Adherence to nutrition and physical activity/exercise program aimed toward attainment of established weight goal;Weight Loss: Understanding of general recommendations for a balanced deficit meal plan, which promotes 1-2 lb weight loss per week and includes a negative energy balance of [phone] kcal/d;Understanding recommendations for meals to include 15-35% energy as protein, 25-35% energy from fat, 35-60% energy from carbohydrates, less than 200mg  of dietary cholesterol, 20-35 gm of total fiber daily;Understanding of distribution of calorie intake throughout the day with the consumption of 4-5 meals/snacks    Improve shortness of breath with ADL's Yes    Intervention Provide education, individualized exercise plan and daily activity instruction to help decrease symptoms of SOB with activities of daily living.    Expected Outcomes Short Term: Improve cardiorespiratory fitness to achieve a reduction of symptoms when performing ADLs;Long Term: Be able to perform more ADLs without symptoms or delay the onset of symptoms    Lipids Yes    Intervention Provide education and support for participant on nutrition & aerobic/resistive exercise along with prescribed medications to achieve LDL 70mg , HDL >40mg .    Expected Outcomes Short Term: Participant states understanding of desired cholesterol values and is compliant with medications prescribed. Participant is following exercise prescription and nutrition guidelines.;Long Term: Cholesterol controlled with medications as prescribed, with individualized exercise RX and with personalized nutrition plan. Value goals: LDL < 70mg , HDL > 40 mg.             Education:Diabetes - Individual verbal and written instruction to review signs/symptoms of diabetes, desired ranges of glucose level fasting, after meals and with exercise. Acknowledge that pre and post exercise glucose checks will be done for 3 sessions at entry of  program.   Know Your Numbers and Heart Failure: - Group verbal and visual instruction to discuss disease risk factors for cardiac and pulmonary disease and treatment options.  Reviews associated critical values for Overweight/Obesity, Hypertension, Cholesterol, and Diabetes.  Discusses basics of heart failure: signs/symptoms and treatments.  Introduces Heart Failure Zone chart for action plan for heart failure.  Written material given at graduation.   Core Components/Risk Factors/Patient Goals Review:    Core Components/Risk Factors/Patient Goals at Discharge (Final Review):    ITP Comments:  ITP Comments     Row Name 04/19/23 1345 04/25/23 1515 05/02/23 1114       ITP Comments Initial phone call completed. Diagnosis can be found in [facility] 10/31. EP Orientation scheduled for Wednesday 11/13 at 1pm. Completed and gym orientation. Initial ITP created and sent for review to Dr. Jinny Sanders, Medical Director. 30 Day review completed. Medical Director ITP review done, changes made as directed, and signed approval by Medical Director.    new to program              Comments:

## 2023-05-07 ENCOUNTER — Encounter: Payer: BC Managed Care – PPO | Admitting: *Deleted

## 2023-05-07 DIAGNOSIS — J8410 Pulmonary fibrosis, unspecified: Secondary | ICD-10-CM

## 2023-05-07 DIAGNOSIS — U099 Post covid-19 condition, unspecified: Secondary | ICD-10-CM | POA: Diagnosis not present

## 2023-05-07 DIAGNOSIS — Z5189 Encounter for other specified aftercare: Secondary | ICD-10-CM | POA: Diagnosis not present

## 2023-05-07 NOTE — Progress Notes (Signed)
Daily Session Note  Patient Details  Name: Denise Mason MRN: [id number] Date of Birth: 09-Jan-1956 Referring Provider:   Flowsheet Row Pulmonary Rehab from 04/25/2023 in [facility] Cardiac and Pulmonary Rehab  Referring Provider Bethann Punches, MD       Encounter Date: 05/07/2023  Check In:  Session Check In - 05/07/23 1116       Check-In   Supervising physician immediately available to respond to emergencies See telemetry face sheet for immediately available ER MD    Location [facility]-Cardiac & Pulmonary Rehab    Staff Present Cora Collum, RN, BSN, CCRP;Margaret Best, MS, Exercise Physiologist;Meredith Jewel Baize, RN Atilano Median, RN, ADN    Virtual Visit No    Medication changes reported     No    Fall or balance concerns reported    No    Warm-up and Cool-down Performed on first and last piece of equipment    Resistance Training Performed Yes    VAD Patient? No    PAD/SET Patient? No      Pain Assessment   Currently in Pain? No/denies                Social History   Tobacco Use  Smoking Status Former   Current packs/day: 0.00   Average packs/day: 1 pack/day for 30.0 years (30.0 ttl pk-yrs)   Types: Cigarettes   Start date: 03/05/1983   Quit date: 06/12/2012   Years since quitting: 10.9  Smokeless Tobacco Never  Tobacco Comments   1 ppd for 30 years     Goals Met:  Independence with exercise equipment Exercise tolerated well No report of concerns or symptoms today Strength training completed today  Goals Unmet:  Not Applicable  Comments: First full day of exercise!  Patient was oriented to gym and equipment including functions, settings, policies, and procedures.  Patient's individual exercise prescription and treatment plan were reviewed.  All starting workloads were established based on the results of the 6 minute walk test done at initial orientation visit.  The plan for exercise progression was also introduced and progression will be customized based on  patient's performance and goals.    Dr. Bethann Punches is Medical Director for [facility] Cardiac Rehabilitation.  Dr. Vida Rigger is Medical Director for [facility] Pulmonary Rehabilitation.

## 2023-05-16 ENCOUNTER — Encounter: Payer: BC Managed Care – PPO | Attending: Internal Medicine | Admitting: *Deleted

## 2023-05-16 DIAGNOSIS — J8410 Pulmonary fibrosis, unspecified: Secondary | ICD-10-CM | POA: Diagnosis present

## 2023-05-16 DIAGNOSIS — U099 Post covid-19 condition, unspecified: Secondary | ICD-10-CM | POA: Insufficient documentation

## 2023-05-16 NOTE — Progress Notes (Signed)
Daily Session Note  Patient Details  Name: Denise Mason MRN: [id number] Date of Birth: 1955/06/30 Referring Provider:   Flowsheet Row Pulmonary Rehab from 04/25/2023 in [facility] Cardiac and Pulmonary Rehab  Referring Provider Bethann Punches, MD       Encounter Date: 05/16/2023  Check In:  Session Check In - 05/16/23 1053       Check-In   Supervising physician immediately available to respond to emergencies See telemetry face sheet for immediately available ER MD    Location [facility]-Cardiac & Pulmonary Rehab    Staff Present Rory Percy, MS, Exercise Physiologist;Meredith Jewel Baize, RN BSN;Joseph Reino Kent, Guinevere Ferrari, RN, ADN    Virtual Visit No    Medication changes reported     No    Fall or balance concerns reported    No    Warm-up and Cool-down Performed on first and last piece of equipment    Resistance Training Performed Yes    VAD Patient? No    PAD/SET Patient? No      Pain Assessment   Currently in Pain? No/denies                Social History   Tobacco Use  Smoking Status Former   Current packs/day: 0.00   Average packs/day: 1 pack/day for 30.0 years (30.0 ttl pk-yrs)   Types: Cigarettes   Start date: 03/05/1983   Quit date: 06/12/2012   Years since quitting: 10.9  Smokeless Tobacco Never  Tobacco Comments   1 ppd for 30 years     Goals Met:  Independence with exercise equipment Exercise tolerated well No report of concerns or symptoms today Strength training completed today  Goals Unmet:  Not Applicable  Comments: Pt able to follow exercise prescription today without complaint.  Will continue to monitor for progression.    Dr. Bethann Punches is Medical Director for [facility] Cardiac Rehabilitation.  Dr. Vida Rigger is Medical Director for [facility] Pulmonary Rehabilitation.

## 2023-05-18 ENCOUNTER — Encounter: Payer: BC Managed Care – PPO | Admitting: *Deleted

## 2023-05-18 DIAGNOSIS — J8410 Pulmonary fibrosis, unspecified: Secondary | ICD-10-CM

## 2023-05-18 NOTE — Progress Notes (Signed)
Daily Session Note  Patient Details  Name: Denise Mason MRN: [id number] Date of Birth: 02-24-1956 Referring Provider:   Flowsheet Row Pulmonary Rehab from 04/25/2023 in [facility] Cardiac and Pulmonary Rehab  Referring Provider Bethann Punches, MD       Encounter Date: 05/18/2023  Check In:  Session Check In - 05/18/23 1118       Check-In   Supervising physician immediately available to respond to emergencies See telemetry face sheet for immediately available ER MD    Location [facility]-Cardiac & Pulmonary Rehab    Staff Present Elige Ko, RCP,RRT,BSRT;Noah Tickle, BS, Exercise Physiologist;Taran Hable, RN, BSN, CCRP    Virtual Visit No    Medication changes reported     No    Fall or balance concerns reported    No    Warm-up and Cool-down Performed on first and last piece of equipment    Resistance Training Performed Yes    VAD Patient? No    PAD/SET Patient? No      Pain Assessment   Currently in Pain? No/denies                Social History   Tobacco Use  Smoking Status Former   Current packs/day: 0.00   Average packs/day: 1 pack/day for 30.0 years (30.0 ttl pk-yrs)   Types: Cigarettes   Start date: 03/05/1983   Quit date: 06/12/2012   Years since quitting: 10.9  Smokeless Tobacco Never  Tobacco Comments   1 ppd for 30 years     Goals Met:  Proper associated with RPD/PD & O2 Sat Independence with exercise equipment Exercise tolerated well No report of concerns or symptoms today  Goals Unmet:  Not Applicable  Comments: Pt able to follow exercise prescription today without complaint.  Will continue to monitor for progression.    Dr. Bethann Punches is Medical Director for [facility] Cardiac Rehabilitation.  Dr. Vida Rigger is Medical Director for [facility] Pulmonary Rehabilitation.

## 2023-05-21 ENCOUNTER — Encounter: Payer: BC Managed Care – PPO | Admitting: *Deleted

## 2023-05-21 DIAGNOSIS — J8410 Pulmonary fibrosis, unspecified: Secondary | ICD-10-CM

## 2023-05-21 NOTE — Progress Notes (Signed)
Daily Session Note  Patient Details  Name: Denise Mason MRN: [id number] Date of Birth: 1955/08/17 Referring Provider:   Flowsheet Row Pulmonary Rehab from 04/25/2023 in [facility] Cardiac and Pulmonary Rehab  Referring Provider Bethann Punches, MD       Encounter Date: 05/21/2023  Check In:  Session Check In - 05/21/23 1115       Check-In   Supervising physician immediately available to respond to emergencies See telemetry face sheet for immediately available ER MD    Location [facility]-Cardiac & Pulmonary Rehab    Staff Present Cora Collum, RN, BSN, CCRP;Margaret Best, MS, Exercise Physiologist;Maxon Conetta BS, , Exercise Physiologist;Mariaisabel Bodiford Katrinka Blazing, RN, ADN    Virtual Visit No    Medication changes reported     No    Fall or balance concerns reported    No    Warm-up and Cool-down Performed on first and last piece of equipment    Resistance Training Performed Yes    VAD Patient? No    PAD/SET Patient? No      Pain Assessment   Currently in Pain? No/denies                Social History   Tobacco Use  Smoking Status Former   Current packs/day: 0.00   Average packs/day: 1 pack/day for 30.0 years (30.0 ttl pk-yrs)   Types: Cigarettes   Start date: 03/05/1983   Quit date: 06/12/2012   Years since quitting: 10.9  Smokeless Tobacco Never  Tobacco Comments   1 ppd for 30 years     Goals Met:  Independence with exercise equipment Exercise tolerated well No report of concerns or symptoms today Strength training completed today  Goals Unmet:  Not Applicable  Comments: Pt able to follow exercise prescription today without complaint.  Will continue to monitor for progression.    Dr. Bethann Punches is Medical Director for [facility] Cardiac Rehabilitation.  Dr. Vida Rigger is Medical Director for [facility] Pulmonary Rehabilitation.

## 2023-05-23 ENCOUNTER — Encounter: Payer: BC Managed Care – PPO | Admitting: *Deleted

## 2023-05-23 DIAGNOSIS — J8410 Pulmonary fibrosis, unspecified: Secondary | ICD-10-CM

## 2023-05-23 NOTE — Progress Notes (Signed)
Daily Session Note  Patient Details  Name: Denise Mason MRN: [id number] Date of Birth: 05/15/1956 Referring Provider:   Flowsheet Row Pulmonary Rehab from 04/25/2023 in [facility] Cardiac and Pulmonary Rehab  Referring Provider Bethann Punches, MD       Encounter Date: 05/23/2023  Check In:  Session Check In - 05/23/23 1100       Check-In   Supervising physician immediately available to respond to emergencies See telemetry face sheet for immediately available ER MD    Location [facility]-Cardiac & Pulmonary Rehab    Staff Present Cora Collum, RN, BSN, CCRP;Noah Tickle, BS, Exercise Physiologist;Maxon Conetta BS, , Exercise Physiologist;Maverick Patman Katrinka Blazing, RN, ADN    Virtual Visit No    Medication changes reported     No    Fall or balance concerns reported    No    Warm-up and Cool-down Performed on first and last piece of equipment    Resistance Training Performed Yes    VAD Patient? No    PAD/SET Patient? No      Pain Assessment   Currently in Pain? No/denies                Social History   Tobacco Use  Smoking Status Former   Current packs/day: 0.00   Average packs/day: 1 pack/day for 30.0 years (30.0 ttl pk-yrs)   Types: Cigarettes   Start date: 03/05/1983   Quit date: 06/12/2012   Years since quitting: 10.9  Smokeless Tobacco Never  Tobacco Comments   1 ppd for 30 years     Goals Met:  Independence with exercise equipment Exercise tolerated well No report of concerns or symptoms today Strength training completed today  Goals Unmet:  Not Applicable  Comments: Pt able to follow exercise prescription today without complaint.  Will continue to monitor for progression.    Dr. Bethann Punches is Medical Director for [facility] Cardiac Rehabilitation.  Dr. Vida Rigger is Medical Director for [facility] Pulmonary Rehabilitation.

## 2023-05-28 ENCOUNTER — Encounter: Payer: BC Managed Care – PPO | Admitting: *Deleted

## 2023-05-28 DIAGNOSIS — J8410 Pulmonary fibrosis, unspecified: Secondary | ICD-10-CM | POA: Diagnosis not present

## 2023-05-28 NOTE — Progress Notes (Signed)
Daily Session Note  Patient Details  Name: Denise Mason MRN: [id number] Date of Birth: 1955-07-29 Referring Provider:   Flowsheet Row Pulmonary Rehab from 04/25/2023 in [facility] Cardiac and Pulmonary Rehab  Referring Provider Bethann Punches, MD       Encounter Date: 05/28/2023  Check In:  Session Check In - 05/28/23 1047       Check-In   Supervising physician immediately available to respond to emergencies See telemetry face sheet for immediately available ER MD    Location [facility]-Cardiac & Pulmonary Rehab    Staff Present Rory Percy, MS, Exercise Physiologist;Maxon Suzzette Righter, , Exercise Physiologist;Kelly Madilyn Fireman, BS, ACSM CEP, Exercise Physiologist;Kaliel Bolds Katrinka Blazing, RN, ADN    Virtual Visit No    Medication changes reported     No    Fall or balance concerns reported    No    Warm-up and Cool-down Performed on first and last piece of equipment    Resistance Training Performed Yes    VAD Patient? No    PAD/SET Patient? No      Pain Assessment   Currently in Pain? No/denies                Social History   Tobacco Use  Smoking Status Former   Current packs/day: 0.00   Average packs/day: 1 pack/day for 30.0 years (30.0 ttl pk-yrs)   Types: Cigarettes   Start date: 03/05/1983   Quit date: 06/12/2012   Years since quitting: 10.9  Smokeless Tobacco Never  Tobacco Comments   1 ppd for 30 years     Goals Met:  Independence with exercise equipment Exercise tolerated well No report of concerns or symptoms today Strength training completed today  Goals Unmet:  Not Applicable  Comments: Pt able to follow exercise prescription today without complaint.  Will continue to monitor for progression.    Dr. Bethann Punches is Medical Director for [facility] Cardiac Rehabilitation.  Dr. Vida Rigger is Medical Director for [facility] Pulmonary Rehabilitation.

## 2023-05-30 ENCOUNTER — Encounter: Payer: BC Managed Care – PPO | Admitting: *Deleted

## 2023-05-30 ENCOUNTER — Encounter: Payer: Self-pay | Admitting: *Deleted

## 2023-05-30 DIAGNOSIS — J8410 Pulmonary fibrosis, unspecified: Secondary | ICD-10-CM | POA: Diagnosis not present

## 2023-05-30 NOTE — Progress Notes (Signed)
Daily Session Note  Patient Details  Name: Denise Mason MRN: [id number] Date of Birth: 1955-10-29 Referring Provider:   Flowsheet Row Pulmonary Rehab from 04/25/2023 in [facility] Cardiac and Pulmonary Rehab  Referring Provider Bethann Punches, MD       Encounter Date: 05/30/2023  Check In:  Session Check In - 05/30/23 1112       Check-In   Supervising physician immediately available to respond to emergencies See telemetry face sheet for immediately available ER MD    Location [facility]-Cardiac & Pulmonary Rehab    Staff Present Elige Ko, RCP,RRT,BSRT;Krista Karleen Hampshire RN, BSN;Maxon Conetta BS, , Exercise Physiologist;Azzure Garabedian Katrinka Blazing, RN, ADN    Virtual Visit No    Medication changes reported     No    Fall or balance concerns reported    No    Warm-up and Cool-down Performed on first and last piece of equipment    Resistance Training Performed Yes    VAD Patient? No    PAD/SET Patient? No      Pain Assessment   Currently in Pain? No/denies                Social History   Tobacco Use  Smoking Status Former   Current packs/day: 0.00   Average packs/day: 1 pack/day for 30.0 years (30.0 ttl pk-yrs)   Types: Cigarettes   Start date: 03/05/1983   Quit date: 06/12/2012   Years since quitting: 10.9  Smokeless Tobacco Never  Tobacco Comments   1 ppd for 30 years     Goals Met:  Independence with exercise equipment Exercise tolerated well No report of concerns or symptoms today Strength training completed today  Goals Unmet:  Not Applicable  Comments: Pt able to follow exercise prescription today without complaint.  Will continue to monitor for progression.    Dr. Bethann Punches is Medical Director for [facility] Cardiac Rehabilitation.  Dr. Vida Rigger is Medical Director for [facility] Pulmonary Rehabilitation.

## 2023-05-30 NOTE — Progress Notes (Signed)
Pulmonary Individual Treatment Plan  Patient Details  Name: Denise Mason MRN: [id number] Date of Birth: 23-Jun-1955 Referring Provider:   Flowsheet Row Pulmonary Rehab from 04/25/2023 in [facility] Cardiac and Pulmonary Rehab  Referring Provider Bethann Punches, MD       Initial Encounter Date:  Flowsheet Row Pulmonary Rehab from 04/25/2023 in [facility] Cardiac and Pulmonary Rehab  Date 04/25/23       Visit Diagnosis: Pulmonary fibrosis (HCC)  Patient's Home Medications on Admission:  Current Outpatient Medications:    albuterol (ACCUNEB) 1.25 MG/3ML nebulizer solution, Take 1 ampule by nebulization every 6 (six) hours as needed., Disp: , Rfl:    albuterol (VENTOLIN HFA) 108 (90 Base) MCG/ACT inhaler, Inhale into the lungs., Disp: , Rfl:    alendronate (FOSAMAX) 70 MG tablet, Take 1 tablet (70 mg total) by mouth every 7 (seven) days. Take with a full glass of water on an empty stomach., Disp: 12 tablet, Rfl: 3   ALPRAZolam (XANAX) 0.5 MG tablet, Take 1 tablet (0.5 mg total) by mouth 2 (two) times daily as needed. for anxiety (Patient not taking: Reported on 04/19/2023), Disp: 10 tablet, Rfl: 0   ammonium lactate (LAC-HYDRIN) 12 % lotion, APPLY TOPICALLY AS NEEDED FOR DRY SKIN (Patient not taking: Reported on 04/19/2023), Disp: 400 g, Rfl: 0   ARMOUR THYROID 30 MG tablet, TAKE 1 TABLET BY MOUTH EVERY DAY EXCEPT TAKE 1 1/2 TABLETS ON SUNDAY (Patient not taking: Reported on 04/19/2023), Disp: 90 tablet, Rfl: 2   aspirin EC 81 MG tablet, Take 1 tablet (81 mg total) by mouth daily. (Patient not taking: Reported on 04/19/2023), Disp: 30 tablet, Rfl: 5   Coenzyme Q10 (COQ-10) 100 MG CAPS, Take 1 capsule by mouth 3 (three) times a week. (Patient not taking: Reported on 04/19/2023), Disp: 100 capsule, Rfl: 0   DULoxetine (CYMBALTA) 60 MG capsule, Take 1 capsule (60 mg total) by mouth daily. (Patient not taking: Reported on 04/19/2023), Disp: 90 capsule, Rfl: 1   ezetimibe (ZETIA) 10 MG tablet, Take 1 tablet (10  mg total) by mouth daily. For cholesterol (Patient not taking: Reported on 04/19/2023), Disp: 90 tablet, Rfl: 1   Fluticasone-Umeclidin-Vilant (TRELEGY ELLIPTA) 100-62.5-25 MCG/ACT AEPB, Inhale into the lungs., Disp: , Rfl:    gabapentin (NEURONTIN) 300 MG capsule, Take 1 capsule (300 mg total) by mouth at bedtime. (Patient not taking: Reported on 04/19/2023), Disp: 90 capsule, Rfl: 1   hydrOXYzine (ATARAX/VISTARIL) 10 MG tablet, Take 1 tablet (10 mg total) by mouth 3 (three) times daily as needed. (Patient not taking: Reported on 04/19/2023), Disp: 30 tablet, Rfl: 0   Krill Oil Omega-3 500 MG CAPS, Take 1 capsule by mouth daily. (Patient not taking: Reported on 04/19/2023), Disp: 30 capsule, Rfl: 0   levothyroxine (SYNTHROID) 50 MCG tablet, Take by mouth., Disp: , Rfl:    nitroGLYCERIN (NITROSTAT) 0.4 MG SL tablet, Place 1 tablet (0.4 mg total) under the tongue every 5 (five) minutes as needed for chest pain. (Patient not taking: Reported on 04/19/2023), Disp: 20 tablet, Rfl: 2   PREMARIN vaginal cream, INSERT 1 APPLICATORFUL VAGINALLY 2 TIMES A WEEK (Patient not taking: Reported on 04/19/2023), Disp: 30 g, Rfl: 10   rosuvastatin (CRESTOR) 10 MG tablet, Take 1 tablet (10 mg total) by mouth 3 (three) times a week. TAKE 1 TABLET(10 MG) BY MOUTH DAILY, Disp: 36 tablet, Rfl: 1   valACYclovir (VALTREX) 1000 MG tablet, Take 1 tablet (1,000 mg total) by mouth 2 (two) times daily. (Patient not taking: Reported on  02/26/2020), Disp: 10 tablet, Rfl: 0  Past Medical History: Past Medical History:  Diagnosis Date   ADD (attention deficit disorder with hyperactivity)    CAD (coronary artery disease)    mild, non-obstructive. by cath 02/16/09 (luminal irreg mid LAD and mid RCA)    Cancer (HCC)    basil   Depression    HLD (hyperlipidemia)    Hypothyroidism    Osteopenia     Tobacco Use: Social History   Tobacco Use  Smoking Status Former   Current packs/day: 0.00   Average packs/day: 1 pack/day for 30.0  years (30.0 ttl pk-yrs)   Types: Cigarettes   Start date: 03/05/1983   Quit date: 06/12/2012   Years since quitting: 10.9  Smokeless Tobacco Never  Tobacco Comments   1 ppd for 30 years     Labs: Review Flowsheet  More data exists      Latest Ref Rng & Units 03/08/2015 03/31/2016 11/21/2016 11/27/2017 06/27/2019  Labs for ITP Cardiac and Pulmonary Rehab  Cholestrol <200 mg/dL 161  096  045  409  811   LDL (calc) mg/dL (calc) 914  782  956  213  156   HDL-C > OR = 50 mg/dL 48  50  52  53  43   Trlycerides <150 mg/dL 086  578  469  629  528   Hemoglobin A1c <5.7 % of total Hgb - 5.6  - 5.5  5.8      Pulmonary Assessment Scores:  Pulmonary Assessment Scores     Row Name 04/25/23 1523         ADL UCSD   ADL Phase Entry     SOB Score total 48     Rest 1     Walk 2     Stairs 5     Bath 0     Dress 0     Shop 2       CAT Score   CAT Score 17       mMRC Score   mMRC Score 1              UCSD: Self-administered rating of dyspnea associated with activities of daily living (ADLs) 6-point scale (0 = "not at all" to 5 = "maximal or unable to do because of breathlessness")  Scoring Scores range from 0 to 120.  Minimally important difference is 5 units  CAT: CAT can identify the health impairment of COPD patients and is better correlated with disease progression.  CAT has a scoring range of zero to 40. The CAT score is classified into four groups of low (less than 10), medium (10 - 20), high (21-30) and very high (31-40) based on the impact level of disease on health status. A CAT score over 10 suggests significant symptoms.  A worsening CAT score could be explained by an exacerbation, poor medication adherence, poor inhaler technique, or progression of COPD or comorbid conditions.  CAT MCID is 2 points  mMRC: mMRC (Modified Medical Research Council) Dyspnea Scale is used to assess the degree of baseline functional disability in patients of respiratory disease due to  dyspnea. No minimal important difference is established. A decrease in score of 1 point or greater is considered a positive change.   Pulmonary Function Assessment:   Exercise Target Goals: Exercise Program Goal: Individual exercise prescription set using results from initial 6 min walk test and THRR while considering  patient's activity barriers and safety.   Exercise Prescription Goal: Initial exercise  prescription builds to 30-45 minutes a day of aerobic activity, 2-3 days per week.  Home exercise guidelines will be given to patient during program as part of exercise prescription that the participant will acknowledge.  Education: Aerobic Exercise: - Group verbal and visual presentation on the components of exercise prescription. Introduces F.I.T.T principle from ACSM for exercise prescriptions.  Reviews F.I.T.T. principles of aerobic exercise including progression. Written material given at graduation.   Education: Resistance Exercise: - Group verbal and visual presentation on the components of exercise prescription. Introduces F.I.T.T principle from ACSM for exercise prescriptions  Reviews F.I.T.T. principles of resistance exercise including progression. Written material given at graduation.    Education: Exercise & Equipment Safety: - Individual verbal instruction and demonstration of equipment use and safety with use of the equipment. Flowsheet Row Pulmonary Rehab from 04/25/2023 in [facility] Cardiac and Pulmonary Rehab  Date 04/25/23  Educator MB  Instruction Review Code 1- Verbalizes Understanding       Education: Exercise Physiology & General Exercise Guidelines: - Group verbal and written instruction with models to review the exercise physiology of the cardiovascular system and associated critical values. Provides general exercise guidelines with specific guidelines to those with heart or lung disease.    Education: Flexibility, Balance, Mind/Body Relaxation: - Group verbal  and visual presentation with interactive activity on the components of exercise prescription. Introduces F.I.T.T principle from ACSM for exercise prescriptions. Reviews F.I.T.T. principles of flexibility and balance exercise training including progression. Also discusses the mind body connection.  Reviews various relaxation techniques to help reduce and manage stress (i.e. Deep breathing, progressive muscle relaxation, and visualization). Balance handout provided to take home. Written material given at graduation.   Activity Barriers & Risk Stratification:  Activity Barriers & Cardiac Risk Stratification - 04/25/23 1518       Activity Barriers & Cardiac Risk Stratification   Activity Barriers Joint Problems;Shortness of Breath;Other (comment)    Comments Osteoporosis             6 Minute Walk:  6 Minute Walk     Row Name 04/25/23 1516         6 Minute Walk   Phase Initial     Distance 1225 feet     Walk Time 6 minutes     # of Rest Breaks 0     MPH 2.3     METS 2.81     RPE 11     Perceived Dyspnea  1     VO2 Peak 9.83     Symptoms No     Resting HR 88 bpm     Resting BP 110/70     Resting Oxygen Saturation  96 %     Exercise Oxygen Saturation  during 6 min walk 87 %     Max Ex. HR 116 bpm     Max Ex. BP 112/76     2 Minute Post BP 108/72       Interval HR   1 Minute HR 110     2 Minute HR 112     3 Minute HR 112     4 Minute HR 113     5 Minute HR 116     6 Minute HR 114     2 Minute Post HR 95     Interval Heart Rate? Yes       Interval Oxygen   Interval Oxygen? Yes     Baseline Oxygen Saturation % 96 %     1  Minute Oxygen Saturation % 93 %     1 Minute Liters of Oxygen 0 L     2 Minute Oxygen Saturation % 87 %     2 Minute Liters of Oxygen 0 L     3 Minute Oxygen Saturation % 87 %     3 Minute Liters of Oxygen 0 L     4 Minute Oxygen Saturation % 87 %     4 Minute Liters of Oxygen 0 L     5 Minute Oxygen Saturation % 88 %     5 Minute Liters of  Oxygen 0 L     6 Minute Oxygen Saturation % 88 %     6 Minute Liters of Oxygen 0 L     2 Minute Post Oxygen Saturation % 95 %     2 Minute Post Liters of Oxygen 0 L             Oxygen Initial Assessment:  Oxygen Initial Assessment - 04/19/23 1335       Home Oxygen   Home Oxygen Device None    Sleep Oxygen Prescription None    Home Exercise Oxygen Prescription None    Home Resting Oxygen Prescription None    Compliance with Home Oxygen Use Yes      Intervention   Short Term Goals To learn and understand importance of monitoring SPO2 with pulse oximeter and demonstrate accurate use of the pulse oximeter.;To learn and understand importance of maintaining oxygen saturations>88%;To learn and demonstrate proper pursed lip breathing techniques or other breathing techniques. ;To learn and demonstrate proper use of respiratory medications    Long  Term Goals Verbalizes importance of monitoring SPO2 with pulse oximeter and return demonstration;Maintenance of O2 saturations>88%;Exhibits proper breathing techniques, such as pursed lip breathing or other method taught during program session;Compliance with respiratory medication;Demonstrates proper use of MDI's             Oxygen Re-Evaluation:  Oxygen Re-Evaluation     Row Name 05/07/23 1118             Goals/Expected Outcomes   Comments Reviewed PLB technique with pt.  Talked about how it works and it's importance in maintaining their exercise saturations.       Goals/Expected Outcomes Short: Become more profiecient at using PLB. Long: Become independent at using PLB.                Oxygen Discharge (Final Oxygen Re-Evaluation):  Oxygen Re-Evaluation - 05/07/23 1118       Goals/Expected Outcomes   Comments Reviewed PLB technique with pt.  Talked about how it works and it's importance in maintaining their exercise saturations.    Goals/Expected Outcomes Short: Become more profiecient at using PLB. Long: Become independent  at using PLB.             Initial Exercise Prescription:  Initial Exercise Prescription - 04/25/23 1500       Date of Initial Exercise RX and Referring Provider   Date 04/25/23    Referring Provider Bethann Punches, MD      Oxygen   Maintain Oxygen Saturation 88% or higher      Treadmill   MPH 2.3    Grade 0    Minutes 15    METs 2.76      Elliptical   Level 1   Try Elliptical   Speed 3    Minutes 15    METs 2.81      REL-XR  Level 2    Speed 50    Minutes 15    METs 2.81      T5 Nustep   Level 2    SPM 80    Minutes 15    METs 2.81      Track   Laps 33    Minutes 15    METs 2.79      Prescription Details   Frequency (times per week) 3    Duration Progress to 30 minutes of continuous aerobic without signs/symptoms of physical distress      Intensity   THRR 40-80% of Max Heartrate 114-141    Ratings of Perceived Exertion 11-13    Perceived Dyspnea 0-4      Progression   Progression Continue to progress workloads to maintain intensity without signs/symptoms of physical distress.      Resistance Training   Training Prescription Yes    Weight 2 lb    Reps 10-15             Perform Capillary Blood Glucose checks as needed.  Exercise Prescription Changes:   Exercise Prescription Changes     Row Name 04/25/23 1500 05/23/23 1400           Response to Exercise   Blood Pressure (Admit) 110/70 102/64      Blood Pressure (Exercise) 112/76 122/60      Blood Pressure (Exit) 108/72 100/60      Heart Rate (Admit) 88 bpm 95 bpm      Heart Rate (Exercise) 116 bpm 125 bpm      Heart Rate (Exit) 95 bpm 105 bpm      Oxygen Saturation (Admit) 96 % 93 %      Oxygen Saturation (Exercise) 87 % 88 %      Oxygen Saturation (Exit) 95 % 94 %      Rating of Perceived Exertion (Exercise) 11 15      Perceived Dyspnea (Exercise) 1 1      Symptoms none none      Comments results First three days of exercise      Duration Progress to 30 minutes of   aerobic without signs/symptoms of physical distress Continue with 30 min of aerobic exercise without signs/symptoms of physical distress.      Intensity THRR New THRR unchanged        Progression   Progression Continue to progress workloads to maintain intensity without signs/symptoms of physical distress. Continue to progress workloads to maintain intensity without signs/symptoms of physical distress.      Average METs 2.81 2.45        Resistance Training   Training Prescription -- Yes      Weight -- 2 lb      Reps -- 10-15        Interval Training   Interval Training -- No        Oxygen   Oxygen -- Continuous        Treadmill   MPH -- 2.7      Grade -- 0      Minutes -- 15      METs -- 3.07        NuStep   Level -- 3      Minutes -- 15      METs -- 2.9        Arm Ergometer   Level -- 1      Minutes -- 15      METs -- 1  T5 Nustep   Level -- 2      Minutes -- 15      METs -- 2        Oxygen   Maintain Oxygen Saturation -- 88% or higher               Exercise Comments:   Exercise Comments     Row Name 05/07/23 1117           Exercise Comments First full day of exercise!  Patient was oriented to gym and equipment including functions, settings, policies, and procedures.  Patient's individual exercise prescription and treatment plan were reviewed.  All starting workloads were established based on the results of the 6 minute walk test done at initial orientation visit.  The plan for exercise progression was also introduced and progression will be customized based on patient's performance and goals.                Exercise Goals and Review:   Exercise Goals     Row Name 04/25/23 1522             Exercise Goals   Increase Physical Activity Yes       Intervention Provide advice, education, support and counseling about physical activity/exercise needs.;Develop an individualized exercise prescription for aerobic and resistive training based on  initial evaluation findings, risk stratification, comorbidities and participant's personal goals.       Expected Outcomes Short Term: Attend rehab on a regular basis to increase amount of physical activity.;Long Term: Add in home exercise to make exercise part of routine and to increase amount of physical activity.;Long Term: Exercising regularly at least 3-5 days a week.       Increase Strength and Stamina Yes       Intervention Provide advice, education, support and counseling about physical activity/exercise needs.;Develop an individualized exercise prescription for aerobic and resistive training based on initial evaluation findings, risk stratification, comorbidities and participant's personal goals.       Expected Outcomes Short Term: Increase workloads from initial exercise prescription for resistance, speed, and METs.;Short Term: Perform resistance training exercises routinely during rehab and add in resistance training at home;Long Term: Improve cardiorespiratory fitness, muscular endurance and strength as measured by increased METs and functional capacity ( )       Able to understand and use rate of perceived exertion (RPE) scale Yes       Intervention Provide education and explanation on how to use RPE scale       Expected Outcomes Short Term: Able to use RPE daily in rehab to express subjective intensity level;Long Term:  Able to use RPE to guide intensity level when exercising independently       Able to understand and use Dyspnea scale Yes       Intervention Provide education and explanation on how to use Dyspnea scale       Expected Outcomes Short Term: Able to use Dyspnea scale daily in rehab to express subjective sense of shortness of breath during exertion;Long Term: Able to use Dyspnea scale to guide intensity level when exercising independently       Knowledge and understanding of Target Heart Rate Range (THRR) Yes       Intervention Provide education and explanation of THRR  including how the numbers were predicted and where they are located for reference       Expected Outcomes Short Term: Able to state/look up THRR;Short Term: Able to use daily as guideline for intensity  in rehab;Long Term: Able to use THRR to govern intensity when exercising independently       Able to check pulse independently Yes       Intervention Provide education and demonstration on how to check pulse in carotid and radial arteries.;Review the importance of being able to check your own pulse for safety during independent exercise       Expected Outcomes Short Term: Able to explain why pulse checking is important during independent exercise;Long Term: Able to check pulse independently and accurately       Understanding of Exercise Prescription Yes       Intervention Provide education, explanation, and written materials on patient's individual exercise prescription       Expected Outcomes Short Term: Able to explain program exercise prescription;Long Term: Able to explain home exercise prescription to exercise independently                Exercise Goals Re-Evaluation :  Exercise Goals Re-Evaluation     Row Name 05/07/23 1117 05/23/23 1424           Exercise Goal Re-Evaluation   Exercise Goals Review Able to understand and use rate of perceived exertion (RPE) scale;Able to understand and use Dyspnea scale;Knowledge and understanding of Target Heart Rate Range (THRR);Understanding of Exercise Prescription Increase Physical Activity;Increase Strength and Stamina;Understanding of Exercise Prescription      Comments Reviewed RPE and dyspnea scale, THR and program prescription with pt today.  Pt voiced understanding and was given a copy of goals to take home. Denise Mason is off to a good start in the program. She was able to increase her treadmill workload up to a speed of 2.7 mph with no incline. She also did well at level 1 on the arm crank, level 2 on the T5 nustep, and level 3 on the T4 nustep.  She also did well with 2 lb hand weights for resistance training. We will continue to monitor her progress in the program.      Expected Outcomes Short: Use RPE daily to regulate intensity. Long: Follow program prescription in THR. Short: Continue to follow current exercise prescription. Long: Continue exercise to improve strength and stamina.               Discharge Exercise Prescription (Final Exercise Prescription Changes):  Exercise Prescription Changes - 05/23/23 1400       Response to Exercise   Blood Pressure (Admit) 102/64    Blood Pressure (Exercise) 122/60    Blood Pressure (Exit) 100/60    Heart Rate (Admit) 95 bpm    Heart Rate (Exercise) 125 bpm    Heart Rate (Exit) 105 bpm    Oxygen Saturation (Admit) 93 %    Oxygen Saturation (Exercise) 88 %    Oxygen Saturation (Exit) 94 %    Rating of Perceived Exertion (Exercise) 15    Perceived Dyspnea (Exercise) 1    Symptoms none    Comments First three days of exercise    Duration Continue with 30 min of aerobic exercise without signs/symptoms of physical distress.    Intensity THRR unchanged      Progression   Progression Continue to progress workloads to maintain intensity without signs/symptoms of physical distress.    Average METs 2.45      Resistance Training   Training Prescription Yes    Weight 2 lb    Reps 10-15      Interval Training   Interval Training No      Oxygen  Oxygen Continuous      Treadmill   MPH 2.7    Grade 0    Minutes 15    METs 3.07      NuStep   Level 3    Minutes 15    METs 2.9      Arm Ergometer   Level 1    Minutes 15    METs 1      T5 Nustep   Level 2    Minutes 15    METs 2      Oxygen   Maintain Oxygen Saturation 88% or higher             Nutrition:  Target Goals: Understanding of nutrition guidelines, daily intake of sodium 1500mg , cholesterol 200mg , calories 30% from fat and 7% or less from saturated fats, daily to have 5 or more servings of fruits  and vegetables.  Education: All About Nutrition: -Group instruction provided by verbal, written material, interactive activities, discussions, models, and posters to present general guidelines for heart healthy nutrition including fat, fiber, MyPlate, the role of sodium in heart healthy nutrition, utilization of the nutrition label, and utilization of this knowledge for meal planning. Follow up email sent as well. Written material given at graduation.   Biometrics:  Pre Biometrics - 04/25/23 1523       Pre Biometrics   Height 5' (1.524 m)    Weight 149 lb 14.4 oz (68 kg)    Waist Circumference 40 inches    Hip Circumference 42 inches    Waist to Hip Ratio 0.95 %    BMI (Calculated) 29.28    Single Leg Stand 30 seconds              Nutrition Therapy Plan and Nutrition Goals:  Nutrition Therapy & Goals - 04/25/23 1527       Personal Nutrition Goals   Nutrition Goal Will meet with RD on 11/18      Intervention Plan   Intervention Prescribe, educate and counsel regarding individualized specific dietary modifications aiming towards targeted core components such as weight, hypertension, lipid management, diabetes, heart failure and other comorbidities.;Nutrition handout(s) given to patient.    Expected Outcomes Short Term Goal: Understand basic principles of dietary content, such as calories, fat, sodium, cholesterol and nutrients.;Short Term Goal: A plan has been developed with personal nutrition goals set during dietitian appointment.;Long Term Goal: Adherence to prescribed nutrition plan.             Nutrition Assessments:  MEDIFICTS Score Key: >=70 Need to make dietary changes  40-70 Heart Healthy Diet <= 40 Therapeutic Level Cholesterol Diet  Flowsheet Row Pulmonary Rehab from 04/25/2023 in [facility] Cardiac and Pulmonary Rehab  Picture Your Plate Total Score on Admission 38      Picture Your Plate Scores: <62 Unhealthy dietary pattern with much room for  improvement. 41-50 Dietary pattern unlikely to meet recommendations for good health and room for improvement. 51-60 More healthful dietary pattern, with some room for improvement.  >60 Healthy dietary pattern, although there may be some specific behaviors that could be improved.   Nutrition Goals Re-Evaluation:   Nutrition Goals Discharge (Final Nutrition Goals Re-Evaluation):   Psychosocial: Target Goals: Acknowledge presence or absence of significant depression and/or stress, maximize coping skills, provide positive support system. Participant is able to verbalize types and ability to use techniques and skills needed for reducing stress and depression.   Education: Stress, Anxiety, and Depression - Group verbal and visual presentation to define  topics covered.  Reviews how body is impacted by stress, anxiety, and depression.  Also discusses healthy ways to reduce stress and to treat/manage anxiety and depression.  Written material given at graduation.   Education: Sleep Hygiene -Provides group verbal and written instruction about how sleep can affect your health.  Define sleep hygiene, discuss sleep cycles and impact of sleep habits. Review good sleep hygiene tips.    Initial Review & Psychosocial Screening:  Initial Psych Review & Screening - 04/19/23 1340       Initial Review   Current issues with None Identified      Family Dynamics   Good Support System? Yes      Barriers   Psychosocial barriers to participate in program There are no identifiable barriers or psychosocial needs.      Screening Interventions   Interventions Encouraged to exercise;Provide feedback about the scores to participant;To provide support and resources with identified psychosocial needs    Expected Outcomes Long Term Goal: Stressors or current issues are controlled or eliminated.;Short Term goal: Utilizing psychosocial counselor, staff and physician to assist with identification of specific Stressors  or current issues interfering with healing process. Setting desired goal for each stressor or current issue identified.;Short Term goal: Identification and review with participant of any Quality of Life or Depression concerns found by scoring the questionnaire.;Long Term goal: The participant improves quality of Life and PHQ9 Scores as seen by post scores and/or verbalization of changes             Quality of Life Scores:  Scores of 19 and below usually indicate a poorer quality of life in these areas.  A difference of  2-3 points is a clinically meaningful difference.  A difference of 2-3 points in the total score of the Quality of Life Index has been associated with significant improvement in overall quality of life, self-image, physical symptoms, and general health in studies assessing change in quality of life.  PHQ-9: Review Flowsheet  More data exists      04/25/2023 02/26/2020 06/27/2019 10/14/2018 06/25/2018  Depression screen PHQ 2/9  Decreased Interest 0 0 1 1 0  Down, Depressed, Hopeless 0 0 0 0 0  PHQ - 2 Score 0 0 1 1 0  Altered sleeping 3 - 3 3 2   Tired, decreased energy 2 - 0 1 1  Change in appetite 1 - 1 2 1   Feeling bad or failure about yourself  0 - 1 1 0  Trouble concentrating 0 - 0 0 0  Moving slowly or fidgety/restless 0 - 0 0 0  Suicidal thoughts 0 - 0 0 0  PHQ-9 Score 6 - 6 8 4   Difficult doing work/chores Not difficult at all - Not difficult at all Somewhat difficult Not difficult at all   Interpretation of Total Score  Total Score Depression Severity:  1-4 = Minimal depression, 5-9 = Mild depression, 10-14 = Moderate depression, 15-19 = Moderately severe depression, 20-27 = Severe depression   Psychosocial Evaluation and Intervention:  Psychosocial Evaluation - 04/19/23 1355       Psychosocial Evaluation & Interventions   Interventions Encouraged to exercise with the program and follow exercise prescription    Comments Ysela is coming to pulmonary rehab w  ith post covid pulmonary fibrosis. When asked about her mental health, she states  that currently she feels strong and ready to make progress in her health. It did come has as a shock at first when her health declined rapidly, but  she feels at peace with it. [place] and to get away from some of the humidity. She misses walking on the beach, but had a hard time doing so after she got sick. They are originally from this area, so she has support locally. She wants to work on her stamina and shortness of breath, which is worse mainly when walking up the stairs like in her condo.    Expected Outcomes Short: attend pulmonary rehab for education and exercise. Long: develop and maintain positive self care habits    Continue Psychosocial Services  Follow up required by staff             Psychosocial Re-Evaluation:   Psychosocial Discharge (Final Psychosocial Re-Evaluation):   Education: Education Goals: Education classes will be provided on a weekly basis, covering required topics. Participant will state understanding/return demonstration of topics presented.  Learning Barriers/Preferences:  Learning Barriers/Preferences - 04/19/23 1347       Learning Barriers/Preferences   Learning Barriers None    Learning Preferences None             General Pulmonary Education Topics:  Infection Prevention: - Provides verbal and written material to individual with discussion of infection control including proper hand washing and proper equipment cleaning during exercise session. Flowsheet Row Pulmonary Rehab from 04/25/2023 in [facility] Cardiac and Pulmonary Rehab  Date 04/25/23  Educator MB  Instruction Review Code 1- Verbalizes Understanding       Falls Prevention: - Provides verbal and written material to individual with discussion of falls prevention and safety. Flowsheet Row Pulmonary Rehab from 04/25/2023 in [facility] Cardiac  and Pulmonary Rehab  Date 04/25/23  Educator MB  Instruction Review Code 1- Verbalizes Understanding       Chronic Lung Disease Review: - Group verbal instruction with posters, models, PowerPoint presentations and videos,  to review new updates, new respiratory medications, new advancements in procedures and treatments. Providing information on websites and "800" numbers for continued self-education. Includes information about supplement oxygen, available portable oxygen systems, continuous and intermittent flow rates, oxygen safety, concentrators, and Medicare reimbursement for oxygen. Explanation of Pulmonary Drugs, including class, frequency, complications, importance of spacers, rinsing mouth after steroid MDI's, and proper cleaning methods for nebulizers. Review of basic lung anatomy and physiology related to function, structure, and complications of lung disease. Review of risk factors. Discussion about methods for diagnosing sleep apnea and types of masks and machines for OSA. Includes a review of the use of types of environmental controls: home humidity, furnaces, filters, dust mite/pet prevention, HEPA vacuums. Discussion about weather changes, air quality and the benefits of nasal washing. Instruction on Warning signs, infection symptoms, calling MD promptly, preventive modes, and value of vaccinations. Review of effective airway clearance, coughing and/or vibration techniques. Emphasizing that all should Create an Action Plan. Written material given at graduation. Flowsheet Row Pulmonary Rehab from 04/25/2023 in [facility] Cardiac and Pulmonary Rehab  Education need identified 04/25/23       AED/CPR: - Group verbal and written instruction with the use of models to demonstrate the basic use of the AED with the basic ABC's of resuscitation.    Anatomy and Cardiac Procedures: - Group verbal and visual presentation and models provide information about basic cardiac anatomy and function.  Reviews the testing methods done to diagnose heart disease and the outcomes of the test results. Describes the treatment choices: Medical Management, Angioplasty, or Coronary Bypass Surgery for  treating various heart conditions including Myocardial Infarction, Angina, Valve Disease, and Cardiac Arrhythmias.  Written material given at graduation.   Medication Safety: - Group verbal and visual instruction to review commonly prescribed medications for heart and lung disease. Reviews the medication, class of the drug, and side effects. Includes the steps to properly store meds and maintain the prescription regimen.  Written material given at graduation.   Other: -Provides group and verbal instruction on various topics (see comments)   Knowledge Questionnaire Score:  Knowledge Questionnaire Score - 04/25/23 1529       Knowledge Questionnaire Score   Pre Score 16/18              Core Components/Risk Factors/Patient Goals at Admission:  Personal Goals and Risk Factors at Admission - 04/25/23 1529       Core Components/Risk Factors/Patient Goals on Admission    Weight Management Yes;Weight Loss    Intervention Weight Management: Develop a combined nutrition and exercise program designed to reach desired caloric intake, while maintaining appropriate intake of nutrient and fiber, sodium and fats, and appropriate energy expenditure required for the weight goal.;Weight Management: Provide education and appropriate resources to help participant work on and attain dietary goals.;Weight Management/Obesity: Establish reasonable short term and long term weight goals.    Admit Weight 149 lb 14.4 oz (68 kg)    Goal Weight: Short Term 144 lb (65.3 kg)    Goal Weight: Long Term 139 lb (63 kg)    Expected Outcomes Short Term: Continue to assess and modify interventions until short term weight is achieved;Long Term: Adherence to nutrition and physical activity/exercise program aimed toward attainment of  established weight goal;Weight Loss: Understanding of general recommendations for a balanced deficit meal plan, which promotes 1-2 lb weight loss per week and includes a negative energy balance of [id number] kcal/d;Understanding recommendations for meals to include 15-35% energy as protein, 25-35% energy from fat, 35-60% energy from carbohydrates, less than 200mg  of dietary cholesterol, 20-35 gm of total fiber daily;Understanding of distribution of calorie intake throughout the day with the consumption of 4-5 meals/snacks    Improve shortness of breath with ADL's Yes    Intervention Provide education, individualized exercise plan and daily activity instruction to help decrease symptoms of SOB with activities of daily living.    Expected Outcomes Short Term: Improve cardiorespiratory fitness to achieve a reduction of symptoms when performing ADLs;Long Term: Be able to perform more ADLs without symptoms or delay the onset of symptoms    Lipids Yes    Intervention Provide education and support for participant on nutrition & aerobic/resistive exercise along with prescribed medications to achieve LDL 70mg , HDL >40mg .    Expected Outcomes Short Term: Participant states understanding of desired cholesterol values and is compliant with medications prescribed. Participant is following exercise prescription and nutrition guidelines.;Long Term: Cholesterol controlled with medications as prescribed, with individualized exercise RX and with personalized nutrition plan. Value goals: LDL < 70mg , HDL > 40 mg.             Education:Diabetes - Individual verbal and written instruction to review signs/symptoms of diabetes, desired ranges of glucose level fasting, after meals and with exercise. Acknowledge that pre and post exercise glucose checks will be done for 3 sessions at entry of program.   Know Your Numbers and Heart Failure: - Group verbal and visual instruction to discuss disease risk factors for cardiac and  pulmonary disease and treatment options.  Reviews associated critical values for Overweight/Obesity, Hypertension, Cholesterol, and  Diabetes.  Discusses basics of heart failure: signs/symptoms and treatments.  Introduces Heart Failure Zone chart for action plan for heart failure.  Written material given at graduation.   Core Components/Risk Factors/Patient Goals Review:    Core Components/Risk Factors/Patient Goals at Discharge (Final Review):    ITP Comments:  ITP Comments     Row Name 04/19/23 1345 04/25/23 1515 05/02/23 1114 05/07/23 1117 05/30/23 1133   ITP Comments Initial phone call completed. Diagnosis can be found in [facility] 10/31. EP Orientation scheduled for Wednesday 11/13 at 1pm. Completed and gym orientation. Initial ITP created and sent for review to Dr. Jinny Sanders, Medical Director. 30 Day review completed. Medical Director ITP review done, changes made as directed, and signed approval by Medical Director.    new to program First full day of exercise!  Patient was oriented to gym and equipment including functions, settings, policies, and procedures.  Patient's individual exercise prescription and treatment plan were reviewed.  All starting workloads were established based on the results of the 6 minute walk test done at initial orientation visit.  The plan for exercise progression was also introduced and progression will be customized based on patient's performance and goals. 30 Day review completed. Medical Director ITP review done, changes made as directed, and signed approval by Medical Director.    new to program            Comments:

## 2023-06-01 ENCOUNTER — Encounter: Payer: BC Managed Care – PPO | Admitting: *Deleted

## 2023-06-01 DIAGNOSIS — J8410 Pulmonary fibrosis, unspecified: Secondary | ICD-10-CM

## 2023-06-01 NOTE — Progress Notes (Signed)
Daily Session Note  Patient Details  Name: KENDRAH KESSINGER MRN: [id number] Date of Birth: 13-Jun-1955 Referring Provider:   Flowsheet Row Pulmonary Rehab from 04/25/2023 in [facility] Cardiac and Pulmonary Rehab  Referring Provider Bethann Punches, MD       Encounter Date: 06/01/2023  Check In:  Session Check In - 06/01/23 1122       Check-In   Supervising physician immediately available to respond to emergencies See telemetry face sheet for immediately available ER MD    Location [facility]-Cardiac & Pulmonary Rehab    Staff Present Cora Collum, RN, BSN, CCRP;Noah Tickle, BS, Exercise Physiologist;Joseph [place], [place]    Virtual Visit No    Medication changes reported     No    Fall or balance concerns reported    No    Warm-up and Cool-down Performed on first and last piece of equipment    Resistance Training Performed Yes    VAD Patient? No    PAD/SET Patient? No      Pain Assessment   Currently in Pain? No/denies                Social History   Tobacco Use  Smoking Status Former   Current packs/day: 0.00   Average packs/day: 1 pack/day for 30.0 years (30.0 ttl pk-yrs)   Types: Cigarettes   Start date: 03/05/1983   Quit date: 06/12/2012   Years since quitting: 10.9  Smokeless Tobacco Never  Tobacco Comments   1 ppd for 30 years     Goals Met:  Proper associated with RPD/PD & O2 Sat Independence with exercise equipment Exercise tolerated well No report of concerns or symptoms today  Goals Unmet:  Not Applicable  Comments: Pt able to follow exercise prescription today without complaint.  Will continue to monitor for progression.    Dr. Bethann Punches is Medical Director for [facility] Cardiac Rehabilitation.  Dr. Vida Rigger is Medical Director for [facility] Pulmonary Rehabilitation.

## 2023-06-04 ENCOUNTER — Encounter: Payer: BC Managed Care – PPO | Admitting: *Deleted

## 2023-06-04 DIAGNOSIS — J8410 Pulmonary fibrosis, unspecified: Secondary | ICD-10-CM

## 2023-06-04 NOTE — Progress Notes (Signed)
Daily Session Note  Patient Details  Name: Denise Mason MRN: [id number] Date of Birth: 1955/10/26 Referring Provider:   Flowsheet Row Pulmonary Rehab from 04/25/2023 in [facility] Cardiac and Pulmonary Rehab  Referring Provider Bethann Punches, MD       Encounter Date: 06/04/2023  Check In:  Session Check In - 06/04/23 1118       Check-In   Supervising physician immediately available to respond to emergencies See telemetry face sheet for immediately available ER MD    Location [facility]-Cardiac & Pulmonary Rehab    Staff Present Rory Percy, MS, Exercise Physiologist;Dannisha Eckmann, RN, BSN, CCRP;Kristen Coble, RN,BC,MSN;Megan Katrinka Blazing, RN, [place]    Virtual Visit No    Medication changes reported     No    Fall or balance concerns reported    No    Warm-up and Cool-down Performed on first and last piece of equipment    Resistance Training Performed Yes    VAD Patient? No    PAD/SET Patient? No      Pain Assessment   Currently in Pain? No/denies                Social History   Tobacco Use  Smoking Status Former   Current packs/day: 0.00   Average packs/day: 1 pack/day for 30.0 years (30.0 ttl pk-yrs)   Types: Cigarettes   Start date: 03/05/1983   Quit date: 06/12/2012   Years since quitting: 10.9  Smokeless Tobacco Never  Tobacco Comments   1 ppd for 30 years     Goals Met:  Proper associated with RPD/PD & O2 Sat Independence with exercise equipment Exercise tolerated well No report of concerns or symptoms today  Goals Unmet:  Not Applicable  Comments: Pt able to follow exercise prescription today without complaint.  Will continue to monitor for progression.    Dr. Bethann Punches is Medical Director for [facility] Cardiac Rehabilitation.  Dr. Vida Rigger is Medical Director for [facility] Pulmonary Rehabilitation.

## 2023-06-14 NOTE — Telephone Encounter (Signed)
 Error

## 2023-06-15 ENCOUNTER — Encounter: Payer: BC Managed Care – PPO | Attending: Internal Medicine | Admitting: *Deleted

## 2023-06-15 DIAGNOSIS — J8410 Pulmonary fibrosis, unspecified: Secondary | ICD-10-CM | POA: Insufficient documentation

## 2023-06-15 DIAGNOSIS — U099 Post covid-19 condition, unspecified: Secondary | ICD-10-CM | POA: Insufficient documentation

## 2023-06-15 NOTE — Progress Notes (Signed)
 Daily Session Note  Patient Details  Name: Denise Mason MRN: [id number] Date of Birth: 1955/11/10 Referring Provider:   Flowsheet Row Pulmonary Rehab from 04/25/2023 in [facility] Cardiac and Pulmonary Rehab  Referring Provider Cleotilde Anes, MD       Encounter Date: 06/15/2023  Check In:  Session Check In - 06/15/23 1131       Check-In   Supervising physician immediately available to respond to emergencies See telemetry face sheet for immediately available ER MD    Staff Present Othel Durand, RN, BSN, CCRP;Noah Tickle, BS, Exercise Physiologist;Maxon Conetta BS, , Exercise Physiologist    Virtual Visit No    Medication changes reported     No    Fall or balance concerns reported    No    Warm-up and Cool-down Performed on first and last piece of equipment    Resistance Training Performed Yes    VAD Patient? No    PAD/SET Patient? No      Pain Assessment   Currently in Pain? No/denies                Social History   Tobacco Use  Smoking Status Former   Current packs/day: 0.00   Average packs/day: 1 pack/day for 30.0 years (30.0 ttl pk-yrs)   Types: Cigarettes   Start date: 03/05/1983   Quit date: 06/12/2012   Years since quitting: 11.0  Smokeless Tobacco Never  Tobacco Comments   1 ppd for 30 years     Goals Met:  Proper associated with RPD/PD & O2 Sat Independence with exercise equipment Exercise tolerated well No report of concerns or symptoms today  Goals Unmet:  Not Applicable  Comments: Pt able to follow exercise prescription today without complaint.  Will continue to monitor for progression.    Dr. Anes Cleotilde is Medical Director for [facility] Cardiac Rehabilitation.  Dr. Fuad Aleskerov is Medical Director for [facility] Pulmonary Rehabilitation.

## 2023-06-18 ENCOUNTER — Encounter: Payer: BC Managed Care – PPO | Admitting: *Deleted

## 2023-06-20 ENCOUNTER — Encounter: Payer: BC Managed Care – PPO | Admitting: *Deleted

## 2023-06-20 DIAGNOSIS — J8410 Pulmonary fibrosis, unspecified: Secondary | ICD-10-CM

## 2023-06-20 NOTE — Progress Notes (Signed)
 Daily Session Note  Patient Details  Name: Denise Mason MRN: [id number] Date of Birth: 07-18-1955 Referring Provider:   Flowsheet Row Pulmonary Rehab from 04/25/2023 in [facility] Cardiac and Pulmonary Rehab  Referring Provider Cleotilde Anes, MD       Encounter Date: 06/20/2023  Check In:  Session Check In - 06/20/23 1049       Check-In   Supervising physician immediately available to respond to emergencies See telemetry face sheet for immediately available ER MD    Location [facility]-Cardiac & Pulmonary Rehab    Staff Present Devaughn Jaeger, BS, Exercise Physiologist;Susanne Bice, RN, BSN, CCRP;Joseph Hood, [place] Arzella Claudene, RN, [place]    Virtual Visit No    Medication changes reported     No    Fall or balance concerns reported    No    Warm-up and Cool-down Performed on first and last piece of equipment    Resistance Training Performed Yes    VAD Patient? No    PAD/SET Patient? No      Pain Assessment   Currently in Pain? No/denies                Social History   Tobacco Use  Smoking Status Former   Current packs/day: 0.00   Average packs/day: 1 pack/day for 30.0 years (30.0 ttl pk-yrs)   Types: Cigarettes   Start date: 03/05/1983   Quit date: 06/12/2012   Years since quitting: 11.0  Smokeless Tobacco Never  Tobacco Comments   1 ppd for 30 years     Goals Met:  Independence with exercise equipment Exercise tolerated well No report of concerns or symptoms today Strength training completed today  Goals Unmet:  Not Applicable  Comments: Pt able to follow exercise prescription today without complaint.  Will continue to monitor for progression.  Reviewed home exercise with pt today.  Pt plans to walk, use hand weights and possibly join the wellzone for exercise.  Reviewed THR, pulse, RPE, sign and symptoms, pulse oximetery and when to call 911 or MD.  Also discussed weather considerations and indoor options.  Pt voiced understanding.    Dr. Anes Cleotilde is  Medical Director for [facility] Cardiac Rehabilitation.  Dr. Fuad Aleskerov is Medical Director for [facility] Pulmonary Rehabilitation.

## 2023-06-20 NOTE — Progress Notes (Signed)
 Daily Session Note  Patient Details  Name: Denise Mason MRN: [id number] Date of Birth: 27-May-1956 Referring Provider:   Flowsheet Row Pulmonary Rehab from 04/25/2023 in [facility] Cardiac and Pulmonary Rehab  Referring Provider Cleotilde Anes, MD       Encounter Date: 06/20/2023  Check In:  Session Check In - 06/20/23 1049       Check-In   Supervising physician immediately available to respond to emergencies See telemetry face sheet for immediately available ER MD    Location [facility]-Cardiac & Pulmonary Rehab    Staff Present Devaughn Jaeger, BS, Exercise Physiologist;Susanne Bice, RN, BSN, CCRP;Joseph Hood, [place] Arzella Claudene, RN, [place]    Virtual Visit No    Medication changes reported     No    Fall or balance concerns reported    No    Warm-up and Cool-down Performed on first and last piece of equipment    Resistance Training Performed Yes    VAD Patient? No    PAD/SET Patient? No      Pain Assessment   Currently in Pain? No/denies                Social History   Tobacco Use  Smoking Status Former   Current packs/day: 0.00   Average packs/day: 1 pack/day for 30.0 years (30.0 ttl pk-yrs)   Types: Cigarettes   Start date: 03/05/1983   Quit date: 06/12/2012   Years since quitting: 11.0  Smokeless Tobacco Never  Tobacco Comments   1 ppd for 30 years     Goals Met:  Independence with exercise equipment Exercise tolerated well No report of concerns or symptoms today Strength training completed today  Goals Unmet:  Not Applicable  Comments: Pt able to follow exercise prescription today without complaint.  Will continue to monitor for progression.    Dr. Anes Cleotilde is Medical Director for [facility] Cardiac Rehabilitation.  Dr. Fuad Aleskerov is Medical Director for [facility] Pulmonary Rehabilitation.

## 2023-06-27 ENCOUNTER — Encounter: Payer: BC Managed Care – PPO | Admitting: *Deleted

## 2023-06-27 DIAGNOSIS — J8410 Pulmonary fibrosis, unspecified: Secondary | ICD-10-CM | POA: Diagnosis not present

## 2023-06-27 NOTE — Progress Notes (Signed)
 Pulmonary Individual Treatment Plan  Patient Details  Name: Denise Mason MRN: [id number] Date of Birth: 06-20-1955 Referring Provider:   Flowsheet Row Pulmonary Rehab from 04/25/2023 in [facility] Cardiac and Pulmonary Rehab  Referring Provider Firman Hughes, MD       Initial Encounter Date:  Flowsheet Row Pulmonary Rehab from 04/25/2023 in [facility] Cardiac and Pulmonary Rehab  Date 04/25/23       Visit Diagnosis: Pulmonary fibrosis (HCC)  Patient's Home Medications on Admission:  Current Outpatient Medications:    albuterol (ACCUNEB) 1.25 MG/3ML nebulizer solution, Take 1 ampule by nebulization every 6 (six) hours as needed., Disp: , Rfl:    albuterol (VENTOLIN HFA) 108 (90 Base) MCG/ACT inhaler, Inhale into the lungs., Disp: , Rfl:    alendronate  (FOSAMAX ) 70 MG tablet, Take 1 tablet (70 mg total) by mouth every 7 (seven) days. Take with a full glass of water on an empty stomach., Disp: 12 tablet, Rfl: 3   ALPRAZolam  (XANAX ) 0.5 MG tablet, Take 1 tablet (0.5 mg total) by mouth 2 (two) times daily as needed. for anxiety (Patient not taking: Reported on 04/19/2023), Disp: 10 tablet, Rfl: 0   ammonium lactate  (LAC-HYDRIN ) 12 % lotion, APPLY TOPICALLY AS NEEDED FOR DRY SKIN (Patient not taking: Reported on 04/19/2023), Disp: 400 g, Rfl: 0   ARMOUR THYROID  30 MG tablet, TAKE 1 TABLET BY MOUTH EVERY DAY EXCEPT TAKE 1 1/2 TABLETS ON SUNDAY (Patient not taking: Reported on 04/19/2023), Disp: 90 tablet, Rfl: 2   aspirin  EC 81 MG tablet, Take 1 tablet (81 mg total) by mouth daily. (Patient not taking: Reported on 04/19/2023), Disp: 30 tablet, Rfl: 5   Coenzyme Q10 (COQ-10) 100 MG CAPS, Take 1 capsule by mouth 3 (three) times a week. (Patient not taking: Reported on 04/19/2023), Disp: 100 capsule, Rfl: 0   DULoxetine  (CYMBALTA ) 60 MG capsule, Take 1 capsule (60 mg total) by mouth daily. (Patient not taking: Reported on 04/19/2023), Disp: 90 capsule, Rfl: 1   ezetimibe  (ZETIA ) 10 MG tablet, Take 1 tablet (10  mg total) by mouth daily. For cholesterol (Patient not taking: Reported on 04/19/2023), Disp: 90 tablet, Rfl: 1   Fluticasone -Umeclidin-Vilant (TRELEGY ELLIPTA) 100-62.5-25 MCG/ACT AEPB, Inhale into the lungs., Disp: , Rfl:    gabapentin  (NEURONTIN ) 300 MG capsule, Take 1 capsule (300 mg total) by mouth at bedtime. (Patient not taking: Reported on 04/19/2023), Disp: 90 capsule, Rfl: 1   hydrOXYzine  (ATARAX /VISTARIL ) 10 MG tablet, Take 1 tablet (10 mg total) by mouth 3 (three) times daily as needed. (Patient not taking: Reported on 04/19/2023), Disp: 30 tablet, Rfl: 0   Krill Oil Omega-3 500 MG CAPS, Take 1 capsule by mouth daily. (Patient not taking: Reported on 04/19/2023), Disp: 30 capsule, Rfl: 0   levothyroxine (SYNTHROID) 50 MCG tablet, Take by mouth., Disp: , Rfl:    nitroGLYCERIN  (NITROSTAT ) 0.4 MG SL tablet, Place 1 tablet (0.4 mg total) under the tongue every 5 (five) minutes as needed for chest pain. (Patient not taking: Reported on 04/19/2023), Disp: 20 tablet, Rfl: 2   PREMARIN  vaginal cream, INSERT 1 APPLICATORFUL VAGINALLY 2 TIMES A WEEK (Patient not taking: Reported on 04/19/2023), Disp: 30 g, Rfl: 10   rosuvastatin  (CRESTOR ) 10 MG tablet, Take 1 tablet (10 mg total) by mouth 3 (three) times a week. TAKE 1 TABLET(10 MG) BY MOUTH DAILY, Disp: 36 tablet, Rfl: 1   valACYclovir  (VALTREX ) 1000 MG tablet, Take 1 tablet (1,000 mg total) by mouth 2 (two) times daily. (Patient not taking: Reported on  02/26/2020), Disp: 10 tablet, Rfl: 0  Past Medical History: Past Medical History:  Diagnosis Date   ADD (attention deficit disorder with hyperactivity)    CAD (coronary artery disease)    mild, non-obstructive. by cath 02/16/09 (luminal irreg mid LAD and mid RCA)    Cancer (HCC)    basil   Depression    HLD (hyperlipidemia)    Hypothyroidism    Osteopenia     Tobacco Use: Social History   Tobacco Use  Smoking Status Former   Current packs/day: 0.00   Average packs/day: 1 pack/day for 30.0  years (30.0 ttl pk-yrs)   Types: Cigarettes   Start date: 03/05/1983   Quit date: 06/12/2012   Years since quitting: 11.0  Smokeless Tobacco Never  Tobacco Comments   1 ppd for 30 years     Labs: Review Flowsheet  More data exists      Latest Ref Rng & Units 03/08/2015 03/31/2016 11/21/2016 11/27/2017 06/27/2019  Labs for ITP Cardiac and Pulmonary Rehab  Cholestrol <200 mg/dL 829  562  130  865  784   LDL (calc) mg/dL (calc) 696  295  284  132  156   HDL-C > OR = 50 mg/dL 48  50  52  53  43   Trlycerides <150 mg/dL 440  102  725  366  440   Hemoglobin A1c <5.7 % of total Hgb - 5.6  - 5.5  5.8      Pulmonary Assessment Scores:  Pulmonary Assessment Scores     Row Name 04/25/23 1523         ADL UCSD   ADL Phase Entry     SOB Score total 48     Rest 1     Walk 2     Stairs 5     Bath 0     Dress 0     Shop 2       CAT Score   CAT Score 17       mMRC Score   mMRC Score 1              UCSD: Self-administered rating of dyspnea associated with activities of daily living (ADLs) 6-point scale (0 = "not at all" to 5 = "maximal or unable to do because of breathlessness")  Scoring Scores range from 0 to 120.  Minimally important difference is 5 units  CAT: CAT can identify the health impairment of COPD patients and is better correlated with disease progression.  CAT has a scoring range of zero to 40. The CAT score is classified into four groups of low (less than 10), medium (10 - 20), high (21-30) and very high (31-40) based on the impact level of disease on health status. A CAT score over 10 suggests significant symptoms.  A worsening CAT score could be explained by an exacerbation, poor medication adherence, poor inhaler technique, or progression of COPD or comorbid conditions.  CAT MCID is 2 points  mMRC: mMRC (Modified Medical Research Council) Dyspnea Scale is used to assess the degree of baseline functional disability in patients of respiratory disease due to  dyspnea. No minimal important difference is established. A decrease in score of 1 point or greater is considered a positive change.   Pulmonary Function Assessment:   Exercise Target Goals: Exercise Program Goal: Individual exercise prescription set using results from initial 6 min walk test and THRR while considering  patient's activity barriers and safety.   Exercise Prescription Goal: Initial exercise  prescription builds to 30-45 minutes a day of aerobic activity, 2-3 days per week.  Home exercise guidelines will be given to patient during program as part of exercise prescription that the participant will acknowledge.  Education: Aerobic Exercise: - Group verbal and visual presentation on the components of exercise prescription. Introduces F.I.T.T principle from ACSM for exercise prescriptions.  Reviews F.I.T.T. principles of aerobic exercise including progression. Written material given at graduation.   Education: Resistance Exercise: - Group verbal and visual presentation on the components of exercise prescription. Introduces F.I.T.T principle from ACSM for exercise prescriptions  Reviews F.I.T.T. principles of resistance exercise including progression. Written material given at graduation.    Education: Exercise & Equipment Safety: - Individual verbal instruction and demonstration of equipment use and safety with use of the equipment. Flowsheet Row Pulmonary Rehab from 04/25/2023 in [facility] Cardiac and Pulmonary Rehab  Date 04/25/23  Educator MB  Instruction Review Code 1- Verbalizes Understanding       Education: Exercise Physiology & General Exercise Guidelines: - Group verbal and written instruction with models to review the exercise physiology of the cardiovascular system and associated critical values. Provides general exercise guidelines with specific guidelines to those with heart or lung disease.    Education: Flexibility, Balance, Mind/Body Relaxation: - Group verbal  and visual presentation with interactive activity on the components of exercise prescription. Introduces F.I.T.T principle from ACSM for exercise prescriptions. Reviews F.I.T.T. principles of flexibility and balance exercise training including progression. Also discusses the mind body connection.  Reviews various relaxation techniques to help reduce and manage stress (i.e. Deep breathing, progressive muscle relaxation, and visualization). Balance handout provided to take home. Written material given at graduation.   Activity Barriers & Risk Stratification:  Activity Barriers & Cardiac Risk Stratification - 04/25/23 1518       Activity Barriers & Cardiac Risk Stratification   Activity Barriers Joint Problems;Shortness of Breath;Other (comment)    Comments Osteoporosis             6 Minute Walk:  6 Minute Walk     Row Name 04/25/23 1516         6 Minute Walk   Phase Initial     Distance 1225 feet     Walk Time 6 minutes     # of Rest Breaks 0     MPH 2.3     METS 2.81     RPE 11     Perceived Dyspnea  1     VO2 Peak 9.83     Symptoms No     Resting HR 88 bpm     Resting BP 110/70     Resting Oxygen Saturation  96 %     Exercise Oxygen Saturation  during 6 min walk 87 %     Max Ex. HR 116 bpm     Max Ex. BP 112/76     2 Minute Post BP 108/72       Interval HR   1 Minute HR 110     2 Minute HR 112     3 Minute HR 112     4 Minute HR 113     5 Minute HR 116     6 Minute HR 114     2 Minute Post HR 95     Interval Heart Rate? Yes       Interval Oxygen   Interval Oxygen? Yes     Baseline Oxygen Saturation % 96 %     1  Minute Oxygen Saturation % 93 %     1 Minute Liters of Oxygen 0 L     2 Minute Oxygen Saturation % 87 %     2 Minute Liters of Oxygen 0 L     3 Minute Oxygen Saturation % 87 %     3 Minute Liters of Oxygen 0 L     4 Minute Oxygen Saturation % 87 %     4 Minute Liters of Oxygen 0 L     5 Minute Oxygen Saturation % 88 %     5 Minute Liters of  Oxygen 0 L     6 Minute Oxygen Saturation % 88 %     6 Minute Liters of Oxygen 0 L     2 Minute Post Oxygen Saturation % 95 %     2 Minute Post Liters of Oxygen 0 L             Oxygen Initial Assessment:  Oxygen Initial Assessment - 04/19/23 1335       Home Oxygen   Home Oxygen Device None    Sleep Oxygen Prescription None    Home Exercise Oxygen Prescription None    Home Resting Oxygen Prescription None    Compliance with Home Oxygen Use Yes      Intervention   Short Term Goals To learn and understand importance of monitoring SPO2 with pulse oximeter and demonstrate accurate use of the pulse oximeter.;To learn and understand importance of maintaining oxygen saturations>88%;To learn and demonstrate proper pursed lip breathing techniques or other breathing techniques. ;To learn and demonstrate proper use of respiratory medications    Long  Term Goals Verbalizes importance of monitoring SPO2 with pulse oximeter and return demonstration;Maintenance of O2 saturations>88%;Exhibits proper breathing techniques, such as pursed lip breathing or other method taught during program session;Compliance with respiratory medication;Demonstrates proper use of MDI's             Oxygen Re-Evaluation:  Oxygen Re-Evaluation     Row Name 05/07/23 1118 06/01/23 1117           Program Oxygen Prescription   Program Oxygen Prescription -- None        Home Oxygen   Home Oxygen Device -- None      Sleep Oxygen Prescription -- None      Home Exercise Oxygen Prescription -- None      Home Resting Oxygen Prescription -- None        Goals/Expected Outcomes   Short Term Goals -- To learn and understand importance of maintaining oxygen saturations>88%;To learn and understand importance of monitoring SPO2 with pulse oximeter and demonstrate accurate use of the pulse oximeter.      Long  Term Goals -- Maintenance of O2 saturations>88%;Verbalizes importance of monitoring SPO2 with pulse oximeter and  return demonstration      Comments Reviewed PLB technique with pt.  Talked about how it works and it's importance in maintaining their exercise saturations. She has a pulse oximeter to check her oxygen saturation at home. Informed and explained why it is important to have one. Reviewed that oxygen saturations should be 88 percent and above. Patient verbalizes understanding.      Goals/Expected Outcomes Short: Become more profiecient at using PLB. Long: Become independent at using PLB. Short: monitor oxygen at home with exertion. Long: maintain oxygen saturations above 88 percent independently.               Oxygen Discharge (Final Oxygen Re-Evaluation):  Oxygen  Re-Evaluation - 06/01/23 1117       Program Oxygen Prescription   Program Oxygen Prescription None      Home Oxygen   Home Oxygen Device None    Sleep Oxygen Prescription None    Home Exercise Oxygen Prescription None    Home Resting Oxygen Prescription None      Goals/Expected Outcomes   Short Term Goals To learn and understand importance of maintaining oxygen saturations>88%;To learn and understand importance of monitoring SPO2 with pulse oximeter and demonstrate accurate use of the pulse oximeter.    Long  Term Goals Maintenance of O2 saturations>88%;Verbalizes importance of monitoring SPO2 with pulse oximeter and return demonstration    Comments She has a pulse oximeter to check her oxygen saturation at home. Informed and explained why it is important to have one. Reviewed that oxygen saturations should be 88 percent and above. Patient verbalizes understanding.    Goals/Expected Outcomes Short: monitor oxygen at home with exertion. Long: maintain oxygen saturations above 88 percent independently.             Initial Exercise Prescription:  Initial Exercise Prescription - 04/25/23 1500       Date of Initial Exercise RX and Referring Provider   Date 04/25/23    Referring Provider Firman Hughes, MD      Oxygen    Maintain Oxygen Saturation 88% or higher      Treadmill   MPH 2.3    Grade 0    Minutes 15    METs 2.76      Elliptical   Level 1   Try Elliptical   Speed 3    Minutes 15    METs 2.81      REL-XR   Level 2    Speed 50    Minutes 15    METs 2.81      T5 Nustep   Level 2    SPM 80    Minutes 15    METs 2.81      Track   Laps 33    Minutes 15    METs 2.79      Prescription Details   Frequency (times per week) 3    Duration Progress to 30 minutes of continuous aerobic without signs/symptoms of physical distress      Intensity   THRR 40-80% of Max Heartrate 114-141    Ratings of Perceived Exertion 11-13    Perceived Dyspnea 0-4      Progression   Progression Continue to progress workloads to maintain intensity without signs/symptoms of physical distress.      Resistance Training   Training Prescription Yes    Weight 2 lb    Reps 10-15             Perform Capillary Blood Glucose checks as needed.  Exercise Prescription Changes:   Exercise Prescription Changes     Row Name 04/25/23 1500 05/23/23 1400 06/05/23 0900 06/20/23 1200 06/21/23 1600     Response to Exercise   Blood Pressure (Admit) 110/70 102/64 112/62 -- 112/64   Blood Pressure (Exercise) 112/76 122/60 134/74 -- --   Blood Pressure (Exit) 108/72 100/60 110/64 -- 104/60   Heart Rate (Admit) 88 bpm 95 bpm 89 bpm -- 95 bpm   Heart Rate (Exercise) 116 bpm 125 bpm 120 bpm -- 116 bpm   Heart Rate (Exit) 95 bpm 105 bpm 102 bpm -- 107 bpm   Oxygen Saturation (Admit) 96 % 93 % 95 % -- 94 %  Oxygen Saturation (Exercise) 87 % 88 % 88 % -- 89 %   Oxygen Saturation (Exit) 95 % 94 % 94 % -- 94 %   Rating of Perceived Exertion (Exercise) 11 15 13  -- 13   Perceived Dyspnea (Exercise) 1 1 0 -- 0   Symptoms none none none -- none   Comments results First three days of exercise -- -- --   Duration Progress to 30 minutes of  aerobic without signs/symptoms of physical distress Continue with 30 min of  aerobic exercise without signs/symptoms of physical distress. Continue with 30 min of aerobic exercise without signs/symptoms of physical distress. Continue with 30 min of aerobic exercise without signs/symptoms of physical distress. Continue with 30 min of aerobic exercise without signs/symptoms of physical distress.   Intensity THRR New THRR unchanged THRR unchanged THRR unchanged THRR unchanged     Progression   Progression Continue to progress workloads to maintain intensity without signs/symptoms of physical distress. Continue to progress workloads to maintain intensity without signs/symptoms of physical distress. Continue to progress workloads to maintain intensity without signs/symptoms of physical distress. Continue to progress workloads to maintain intensity without signs/symptoms of physical distress. Continue to progress workloads to maintain intensity without signs/symptoms of physical distress.   Average METs 2.81 2.45 2.56 2.56 2.57     Resistance Training   Training Prescription -- Yes Yes Yes Yes   Weight -- 2 lb 2 lb 2 lb 3 lb   Reps -- 10-15 10-15 10-15 10-15     Interval Training   Interval Training -- No No No No     Oxygen   Oxygen -- Continuous -- -- --     Treadmill   MPH -- 2.7 2.8 2.8 2.7   Grade -- 0 0 0 0   Minutes -- 15 15 15 15    METs -- 3.07 3.14 3.14 3.07     NuStep   Level -- 3 1 1  --   Minutes -- 15 15 15  --   METs -- 2.9 2.2 2.2 --     Arm Ergometer   Level -- 1 -- -- --   Minutes -- 15 -- -- --   METs -- 1 -- -- --     T5 Nustep   Level -- 2 1 1 1    Minutes -- 15 15 15 15    METs -- 2 2.2 2.2 2.2     Biostep-RELP   Level -- -- 1 1 --   SPM -- -- 50 50 --   Minutes -- -- 15 15 --   METs -- -- 2 2 --     Home Exercise Plan   Plans to continue exercise at -- -- -- Home (comment)  walk, hand weights, and possibly join the wellzone Home (comment)  walk, hand weights, and possibly join the wellzone   Frequency -- -- -- Add 2 additional days  to program exercise sessions. Add 2 additional days to program exercise sessions.   Initial Home Exercises Provided -- -- -- 06/20/23 06/20/23     Oxygen   Maintain Oxygen Saturation -- 88% or higher 88% or higher 88% or higher 88% or higher            Exercise Comments:   Exercise Comments     Row Name 05/07/23 1117           Exercise Comments First full day of exercise!  Patient was oriented to gym and equipment including functions, settings, policies, and  procedures.  Patient's individual exercise prescription and treatment plan were reviewed.  All starting workloads were established based on the results of the 6 minute walk test done at initial orientation visit.  The plan for exercise progression was also introduced and progression will be customized based on patient's performance and goals.                Exercise Goals and Review:   Exercise Goals     Row Name 04/25/23 1522             Exercise Goals   Increase Physical Activity Yes       Intervention Provide advice, education, support and counseling about physical activity/exercise needs.;Develop an individualized exercise prescription for aerobic and resistive training based on initial evaluation findings, risk stratification, comorbidities and participant's personal goals.       Expected Outcomes Short Term: Attend rehab on a regular basis to increase amount of physical activity.;Long Term: Add in home exercise to make exercise part of routine and to increase amount of physical activity.;Long Term: Exercising regularly at least 3-5 days a week.       Increase Strength and Stamina Yes       Intervention Provide advice, education, support and counseling about physical activity/exercise needs.;Develop an individualized exercise prescription for aerobic and resistive training based on initial evaluation findings, risk stratification, comorbidities and participant's personal goals.       Expected Outcomes Short Term:  Increase workloads from initial exercise prescription for resistance, speed, and METs.;Short Term: Perform resistance training exercises routinely during rehab and add in resistance training at home;Long Term: Improve cardiorespiratory fitness, muscular endurance and strength as measured by increased METs and functional capacity ( )       Able to understand and use rate of perceived exertion (RPE) scale Yes       Intervention Provide education and explanation on how to use RPE scale       Expected Outcomes Short Term: Able to use RPE daily in rehab to express subjective intensity level;Long Term:  Able to use RPE to guide intensity level when exercising independently       Able to understand and use Dyspnea scale Yes       Intervention Provide education and explanation on how to use Dyspnea scale       Expected Outcomes Short Term: Able to use Dyspnea scale daily in rehab to express subjective sense of shortness of breath during exertion;Long Term: Able to use Dyspnea scale to guide intensity level when exercising independently       Knowledge and understanding of Target Heart Rate Range (THRR) Yes       Intervention Provide education and explanation of THRR including how the numbers were predicted and where they are located for reference       Expected Outcomes Short Term: Able to state/look up THRR;Short Term: Able to use daily as guideline for intensity in rehab;Long Term: Able to use THRR to govern intensity when exercising independently       Able to check pulse independently Yes       Intervention Provide education and demonstration on how to check pulse in carotid and radial arteries.;Review the importance of being able to check your own pulse for safety during independent exercise       Expected Outcomes Short Term: Able to explain why pulse checking is important during independent exercise;Long Term: Able to check pulse independently and accurately       Understanding of Exercise  Prescription Yes       Intervention Provide education, explanation, and written materials on patient's individual exercise prescription       Expected Outcomes Short Term: Able to explain program exercise prescription;Long Term: Able to explain home exercise prescription to exercise independently                Exercise Goals Re-Evaluation :  Exercise Goals Re-Evaluation     Row Name 05/07/23 1117 05/23/23 1424 06/05/23 0943 06/20/23 1205 06/21/23 1604     Exercise Goal Re-Evaluation   Exercise Goals Review Able to understand and use rate of perceived exertion (RPE) scale;Able to understand and use Dyspnea scale;Knowledge and understanding of Target Heart Rate Range (THRR);Understanding of Exercise Prescription Increase Physical Activity;Increase Strength and Stamina;Understanding of Exercise Prescription Increase Physical Activity;Understanding of Exercise Prescription;Increase Strength and Stamina Increase Physical Activity;Able to understand and use rate of perceived exertion (RPE) scale;Knowledge and understanding of Target Heart Rate Range (THRR);Understanding of Exercise Prescription;Increase Strength and Stamina;Able to understand and use Dyspnea scale;Able to check pulse independently Increase Physical Activity;Understanding of Exercise Prescription;Increase Strength and Stamina   Comments Reviewed RPE and dyspnea scale, THR and program prescription with pt today.  Pt voiced understanding and was given a copy of goals to take home. Eliyanah is off to a good start in the program. She was able to increase her treadmill workload up to a speed of 2.7 mph with no incline. She also did well at level 1 on the arm crank, level 2 on the T5 nustep, and level 3 on the T4 nustep. She also did well with 2 lb hand weights for resistance training. We will continue to monitor her progress in the program. Aderonke is doing well in rehab. She increased her treadmill speed to 2. with no incline. We will continue  to monitor her progress in the program. Reviewed home exercise with pt today.  Pt plans to walk, use hand weights and possibly join the wellzone for exercise.  Reviewed THR, pulse, RPE, sign and symptoms, pulse oximetery and when to call 911 or MD.  Also discussed weather considerations and indoor options.  Pt voiced understanding. Trine continues to do well in the program. She continues to do well on the treadmill at a speed of 2.7 mph with no incline and has stayed consistent at level 1 on the T5 nustep. She did increase from 2 lb to 3 lb handweights for resistance training. We will continue to monitor her progress in the program.   Expected Outcomes Short: Use RPE daily to regulate intensity. Long: Follow program prescription in THR. Short: Continue to follow current exercise prescription. Long: Continue exercise to improve strength and stamina. Short: Continue to progressively increase treadmill workload. Long: Continue exercise to improve strength and stamina. Short: join [facility] and add 1-2 days a week of exercie on off days of rehab. Long: maintain independent exercise routine. Short: Begin to progressively increase treadmill workload. Long: Continue exercise to improve strength and stamina.            Discharge Exercise Prescription (Final Exercise Prescription Changes):  Exercise Prescription Changes - 06/21/23 1600       Response to Exercise   Blood Pressure (Admit) 112/64    Blood Pressure (Exit) 104/60    Heart Rate (Admit) 95 bpm    Heart Rate (Exercise) 116 bpm    Heart Rate (Exit) 107 bpm    Oxygen Saturation (Admit) 94 %    Oxygen Saturation (Exercise) 89 %  Oxygen Saturation (Exit) 94 %    Rating of Perceived Exertion (Exercise) 13    Perceived Dyspnea (Exercise) 0    Symptoms none    Duration Continue with 30 min of aerobic exercise without signs/symptoms of physical distress.    Intensity THRR unchanged      Progression   Progression Continue to progress workloads  to maintain intensity without signs/symptoms of physical distress.    Average METs 2.57      Resistance Training   Training Prescription Yes    Weight 3 lb    Reps 10-15      Interval Training   Interval Training No      Treadmill   MPH 2.7    Grade 0    Minutes 15    METs 3.07      T5 Nustep   Level 1    Minutes 15    METs 2.2      Home Exercise Plan   Plans to continue exercise at Home (comment)   walk, hand weights, and possibly join the [facility]   Frequency Add 2 additional days to program exercise sessions.    Initial Home Exercises Provided 06/20/23      Oxygen   Maintain Oxygen Saturation 88% or higher             Nutrition:  Target Goals: Understanding of nutrition guidelines, daily intake of sodium 1500mg , cholesterol 200mg , calories 30% from fat and 7% or less from saturated fats, daily to have 5 or more servings of fruits and vegetables.  Education: All About Nutrition: -Group instruction provided by verbal, written material, interactive activities, discussions, models, and posters to present general guidelines for heart healthy nutrition including fat, fiber, MyPlate, the role of sodium in heart healthy nutrition, utilization of the nutrition label, and utilization of this knowledge for meal planning. Follow up email sent as well. Written material given at graduation.   Biometrics:  Pre Biometrics - 04/25/23 1523       Pre Biometrics   Height 5' (1.524 m)    Weight 149 lb 14.4 oz (68 kg)    Waist Circumference 40 inches    Hip Circumference 42 inches    Waist to Hip Ratio 0.95 %    BMI (Calculated) 29.28    Single Leg Stand 30 seconds              Nutrition Therapy Plan and Nutrition Goals:  Nutrition Therapy & Goals - 04/25/23 1527       Personal Nutrition Goals   Nutrition Goal Will meet with RD on 11/18      Intervention Plan   Intervention Prescribe, educate and counsel regarding individualized specific dietary modifications  aiming towards targeted core components such as weight, hypertension, lipid management, diabetes, heart failure and other comorbidities.;Nutrition handout(s) given to patient.    Expected Outcomes Short Term Goal: Understand basic principles of dietary content, such as calories, fat, sodium, cholesterol and nutrients.;Short Term Goal: A plan has been developed with personal nutrition goals set during dietitian appointment.;Long Term Goal: Adherence to prescribed nutrition plan.             Nutrition Assessments:  MEDIFICTS Score Key: >=70 Need to make dietary changes  40-70 Heart Healthy Diet <= 40 Therapeutic Level Cholesterol Diet  Flowsheet Row Pulmonary Rehab from 04/25/2023 in [facility] Cardiac and Pulmonary Rehab  Picture Your Plate Total Score on Admission 38      Picture Your Plate Scores: <16 Unhealthy dietary pattern with  much room for improvement. 41-50 Dietary pattern unlikely to meet recommendations for good health and room for improvement. 51-60 More healthful dietary pattern, with some room for improvement.  >60 Healthy dietary pattern, although there may be some specific behaviors that could be improved.   Nutrition Goals Re-Evaluation:  Nutrition Goals Re-Evaluation     Row Name 06/01/23 1119             Goals   Current Weight 148 lb (67.1 kg)       Comment Patient was informed on why it is important to maintain a balanced diet when dealing with Respiratory issues. Explained that it takes a lot of energy to breath and when they are short of breath often they will need to have a good diet to help keep up with the calories they are expending for breathing.       Expected Outcome Short: Choose and plan snacks accordingly to patients caloric intake to improve breathing. Long: Maintain a diet independently that meets their caloric intake to aid in daily shortness of breath.                Nutrition Goals Discharge (Final Nutrition Goals Re-Evaluation):   Nutrition Goals Re-Evaluation - 06/01/23 1119       Goals   Current Weight 148 lb (67.1 kg)    Comment Patient was informed on why it is important to maintain a balanced diet when dealing with Respiratory issues. Explained that it takes a lot of energy to breath and when they are short of breath often they will need to have a good diet to help keep up with the calories they are expending for breathing.    Expected Outcome Short: Choose and plan snacks accordingly to patients caloric intake to improve breathing. Long: Maintain a diet independently that meets their caloric intake to aid in daily shortness of breath.             Psychosocial: Target Goals: Acknowledge presence or absence of significant depression and/or stress, maximize coping skills, provide positive support system. Participant is able to verbalize types and ability to use techniques and skills needed for reducing stress and depression.   Education: Stress, Anxiety, and Depression - Group verbal and visual presentation to define topics covered.  Reviews how body is impacted by stress, anxiety, and depression.  Also discusses healthy ways to reduce stress and to treat/manage anxiety and depression.  Written material given at graduation.   Education: Sleep Hygiene -Provides group verbal and written instruction about how sleep can affect your health.  Define sleep hygiene, discuss sleep cycles and impact of sleep habits. Review good sleep hygiene tips.    Initial Review & Psychosocial Screening:  Initial Psych Review & Screening - 04/19/23 1340       Initial Review   Current issues with None Identified      Family Dynamics   Good Support System? Yes      Barriers   Psychosocial barriers to participate in program There are no identifiable barriers or psychosocial needs.      Screening Interventions   Interventions Encouraged to exercise;Provide feedback about the scores to participant;To provide support and resources  with identified psychosocial needs    Expected Outcomes Long Term Goal: Stressors or current issues are controlled or eliminated.;Short Term goal: Utilizing psychosocial counselor, staff and physician to assist with identification of specific Stressors or current issues interfering with healing process. Setting desired goal for each stressor or current issue identified.;Short Term  goal: Identification and review with participant of any Quality of Life or Depression concerns found by scoring the questionnaire.;Long Term goal: The participant improves quality of Life and PHQ9 Scores as seen by post scores and/or verbalization of changes             Quality of Life Scores:  Scores of 19 and below usually indicate a poorer quality of life in these areas.  A difference of  2-3 points is a clinically meaningful difference.  A difference of 2-3 points in the total score of the Quality of Life Index has been associated with significant improvement in overall quality of life, self-image, physical symptoms, and general health in studies assessing change in quality of life.  PHQ-9: Review Flowsheet  More data exists      06/01/2023 04/25/2023 02/26/2020 06/27/2019 10/14/2018  Depression screen PHQ 2/9  Decreased Interest 0 0 0 1 1  Down, Depressed, Hopeless 0 0 0 0 0  PHQ - 2 Score 0 0 0 1 1  Altered sleeping 3 3 - 3 3  Tired, decreased energy 1 2 - 0 1  Change in appetite 0 1 - 1 2  Feeling bad or failure about yourself  0 0 - 1 1  Trouble concentrating 0 0 - 0 0  Moving slowly or fidgety/restless 0 0 - 0 0  Suicidal thoughts 0 0 - 0 0  PHQ-9 Score 4 6 - 6 8  Difficult doing work/chores Not difficult at all Not difficult at all - Not difficult at all Somewhat difficult   Interpretation of Total Score  Total Score Depression Severity:  1-4 = Minimal depression, 5-9 = Mild depression, 10-14 = Moderate depression, 15-19 = Moderately severe depression, 20-27 = Severe depression   Psychosocial  Evaluation and Intervention:  Psychosocial Evaluation - 04/19/23 1355       Psychosocial Evaluation & Interventions   Interventions Encouraged to exercise with the program and follow exercise prescription    Comments 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 When asked about her mental health, she states  that currently she feels strong and ready to make progress in her health. It did come has as a shock at first when her health declined rapidly, but she feels at peace with it. [place] and to get away from some of the humidity. She misses walking on the beach, but had a hard time doing so after she got sick. They are originally from this area, so she has support locally. She wants to work on her stamina and shortness of breath, which is worse mainly when walking up the stairs like in her condo.    Expected Outcomes Short: attend pulmonary rehab for education and exercise. Long: develop and maintain positive self care habits    Continue Psychosocial Services  Follow up required by staff             Psychosocial Re-Evaluation:  Psychosocial Re-Evaluation     Row Name 06/01/23 1121             Psychosocial Re-Evaluation   Current issues with None Identified       Comments Reviewed patient health questionnaire (PHQ-9) with patient for follow up. Previously, patients score indicated signs/symptoms of depression.  Reviewed to see if patient is improving symptom wise while in program.  Score improved and patient states that it is because she has been able  to exercise and move more.       Expected Outcomes Short: Continue to attend [facility] regularly for regular exercise and social engagement. Long: Continue to improve symptoms and manage a positive mental state.       Interventions Encouraged to attend Pulmonary Rehabilitation for the exercise       Continue Psychosocial Services  Follow up  required by staff                Psychosocial Discharge (Final Psychosocial Re-Evaluation):  Psychosocial Re-Evaluation - 06/01/23 1121       Psychosocial Re-Evaluation   Current issues with None Identified    Comments Reviewed patient health questionnaire (PHQ-9) with patient for follow up. Previously, patients score indicated signs/symptoms of depression.  Reviewed to see if patient is improving symptom wise while in program.  Score improved and patient states that it is because she has been able to exercise and move more.    Expected Outcomes Short: Continue to attend [facility] regularly for regular exercise and social engagement. Long: Continue to improve symptoms and manage a positive mental state.    Interventions Encouraged to attend Pulmonary Rehabilitation for the exercise    Continue Psychosocial Services  Follow up required by staff             Education: Education Goals: Education classes will be provided on a weekly basis, covering required topics. Participant will state understanding/return demonstration of topics presented.  Learning Barriers/Preferences:  Learning Barriers/Preferences - 04/19/23 1347       Learning Barriers/Preferences   Learning Barriers None    Learning Preferences None             General Pulmonary Education Topics:  Infection Prevention: - Provides verbal and written material to individual with discussion of infection control including proper hand washing and proper equipment cleaning during exercise session. Flowsheet Row Pulmonary Rehab from 04/25/2023 in [facility] Cardiac and Pulmonary Rehab  Date 04/25/23  Educator MB  Instruction Review Code 1- Verbalizes Understanding       Falls Prevention: - Provides verbal and written material to individual with discussion of falls prevention and safety. Flowsheet Row Pulmonary Rehab from 04/25/2023 in [facility] Cardiac and Pulmonary Rehab  Date 04/25/23  Educator MB  Instruction Review  Code 1- Verbalizes Understanding       Chronic Lung Disease Review: - Group verbal instruction with posters, models, PowerPoint presentations and videos,  to review new updates, new respiratory medications, new advancements in procedures and treatments. Providing information on websites and "800" numbers for continued self-education. Includes information about supplement oxygen, available portable oxygen systems, continuous and intermittent flow rates, oxygen safety, concentrators, and Medicare reimbursement for oxygen. Explanation of Pulmonary Drugs, including class, frequency, complications, importance of spacers, rinsing mouth after steroid MDI's, and proper cleaning methods for nebulizers. Review of basic lung anatomy and physiology related to function, structure, and complications of lung disease. Review of risk factors. Discussion about methods for diagnosing sleep apnea and types of masks and machines for OSA. Includes a review of the use of types of environmental controls: home humidity, furnaces, filters, dust mite/pet prevention, HEPA vacuums. Discussion about weather changes, air quality and the benefits of nasal washing. Instruction on Warning signs, infection symptoms, calling MD promptly, preventive modes, and value of vaccinations. Review of effective airway clearance, coughing and/or vibration techniques. Emphasizing that all should Create an Action Plan. Written material given at graduation. Flowsheet Row Pulmonary Rehab from 04/25/2023 in [facility] Cardiac and Pulmonary Rehab  Education  need identified 04/25/23       AED/CPR: - Group verbal and written instruction with the use of models to demonstrate the basic use of the AED with the basic ABC's of resuscitation.    Anatomy and Cardiac Procedures: - Group verbal and visual presentation and models provide information about basic cardiac anatomy and function. Reviews the testing methods done to diagnose heart disease and the outcomes  of the test results. Describes the treatment choices: Medical Management, Angioplasty, or Coronary Bypass Surgery for treating various heart conditions including Myocardial Infarction, Angina, Valve Disease, and Cardiac Arrhythmias.  Written material given at graduation.   Medication Safety: - Group verbal and visual instruction to review commonly prescribed medications for heart and lung disease. Reviews the medication, class of the drug, and side effects. Includes the steps to properly store meds and maintain the prescription regimen.  Written material given at graduation.   Other: -Provides group and verbal instruction on various topics (see comments)   Knowledge Questionnaire Score:  Knowledge Questionnaire Score - 04/25/23 1529       Knowledge Questionnaire Score   Pre Score 16/18              Core Components/Risk Factors/Patient Goals at Admission:  Personal Goals and Risk Factors at Admission - 04/25/23 1529       Core Components/Risk Factors/Patient Goals on Admission    Weight Management Yes;Weight Loss    Intervention Weight Management: Develop a combined nutrition and exercise program designed to reach desired caloric intake, while maintaining appropriate intake of nutrient and fiber, sodium and fats, and appropriate energy expenditure required for the weight goal.;Weight Management: Provide education and appropriate resources to help participant work on and attain dietary goals.;Weight Management/Obesity: Establish reasonable short term and long term weight goals.    Admit Weight 149 lb 14.4 oz (68 kg)    Goal Weight: Short Term 144 lb (65.3 kg)    Goal Weight: Long Term 139 lb (63 kg)    Expected Outcomes Short Term: Continue to assess and modify interventions until short term weight is achieved;Long Term: Adherence to nutrition and physical activity/exercise program aimed toward attainment of established weight goal;Weight Loss: Understanding of general recommendations  for a balanced deficit meal plan, which promotes 1-2 lb weight loss per week and includes a negative energy balance of [phone] kcal/d;Understanding recommendations for meals to include 15-35% energy as protein, 25-35% energy from fat, 35-60% energy from carbohydrates, less than 200mg  of dietary cholesterol, 20-35 gm of total fiber daily;Understanding of distribution of calorie intake throughout the day with the consumption of 4-5 meals/snacks    Improve shortness of breath with ADL's Yes    Intervention Provide education, individualized exercise plan and daily activity instruction to help decrease symptoms of SOB with activities of daily living.    Expected Outcomes Short Term: Improve cardiorespiratory fitness to achieve a reduction of symptoms when performing ADLs;Long Term: Be able to perform more ADLs without symptoms or delay the onset of symptoms    Lipids Yes    Intervention Provide education and support for participant on nutrition & aerobic/resistive exercise along with prescribed medications to achieve LDL 70mg , HDL >40mg .    Expected Outcomes Short Term: Participant states understanding of desired cholesterol values and is compliant with medications prescribed. Participant is following exercise prescription and nutrition guidelines.;Long Term: Cholesterol controlled with medications as prescribed, with individualized exercise RX and with personalized nutrition plan. Value goals: LDL < 70mg , HDL > 40 mg.  Education:Diabetes - Individual verbal and written instruction to review signs/symptoms of diabetes, desired ranges of glucose level fasting, after meals and with exercise. Acknowledge that pre and post exercise glucose checks will be done for 3 sessions at entry of program.   Know Your Numbers and Heart Failure: - Group verbal and visual instruction to discuss disease risk factors for cardiac and pulmonary disease and treatment options.  Reviews associated critical values  for Overweight/Obesity, Hypertension, Cholesterol, and Diabetes.  Discusses basics of heart failure: signs/symptoms and treatments.  Introduces Heart Failure Zone chart for action plan for heart failure.  Written material given at graduation.   Core Components/Risk Factors/Patient Goals Review:   Goals and Risk Factor Review     Row Name 06/01/23 1118             Core Components/Risk Factors/Patient Goals Review   Personal Goals Review Improve shortness of breath with ADL's       Review Spoke to patient about their shortness of breath and what they can do to improve. Patient has been informed of breathing techniques when starting the program. Patient is informed to tell staff if they have had any med changes and that certain meds they are taking or not taking can be causing shortness of breath.       Expected Outcomes Short: Attend [facility] regularly to improve shortness of breath with ADL's. Long: maintain independence with ADL's                Core Components/Risk Factors/Patient Goals at Discharge (Final Review):   Goals and Risk Factor Review - 06/01/23 1118       Core Components/Risk Factors/Patient Goals Review   Personal Goals Review Improve shortness of breath with ADL's    Review Spoke to patient about their shortness of breath and what they can do to improve. Patient has been informed of breathing techniques when starting the program. Patient is informed to tell staff if they have had any med changes and that certain meds they are taking or not taking can be causing shortness of breath.    Expected Outcomes Short: Attend [facility] regularly to improve shortness of breath with ADL's. Long: maintain independence with ADL's             ITP Comments:  ITP Comments     Row Name 04/19/23 1345 04/25/23 1515 05/02/23 1114 05/07/23 1117 05/30/23 1133   ITP Comments Initial phone call completed. Diagnosis can be found in [facility] 10/31. EP Orientation scheduled for Wednesday  11/13 at 1pm. Completed and gym orientation. Initial ITP created and sent for review to Dr. Faud Aleskerov, Medical Director. 30 Day review completed. Medical Director ITP review done, changes made as directed, and signed approval by Medical Director.    new to program First full day of exercise!  Patient was oriented to gym and equipment including functions, settings, policies, and procedures.  Patient's individual exercise prescription and treatment plan were reviewed.  All starting workloads were established based on the results of the 6 minute walk test done at initial orientation visit.  The plan for exercise progression was also introduced and progression will be customized based on patient's performance and goals. 30 Day review completed. Medical Director ITP review done, changes made as directed, and signed approval by Medical Director.    new to program    Row Name 06/27/23 1458           ITP Comments 30 Day review completed. Medical Director ITP review  done, changes made as directed, and signed approval by Medical Director.    new to program                Comments: 30 day review

## 2023-06-27 NOTE — Progress Notes (Signed)
 Daily Session Note  Patient Details  Name: Denise Mason MRN: [id number] Date of Birth: [birth year] Referring Provider:   Flowsheet Row Pulmonary Rehab from 04/25/2023 in [facility] Cardiac and Pulmonary Rehab  Referring Provider Firman Hughes, MD       Encounter Date: 06/27/2023  Check In:  Session Check In - 06/27/23 1124       Check-In   Supervising physician immediately available to respond to emergencies See telemetry face sheet for immediately available ER MD    Location [facility]-Cardiac & Pulmonary Rehab    Staff Present Maud Sorenson, RN, BSN, CCRP;Meredith Manson Seitz RN,BSN;Joseph Hood RCP,RRT,BSRT;Maxon Ross BS, Exercise Physiologist    Virtual Visit No    Medication changes reported     No    Fall or balance concerns reported    No    Warm-up and Cool-down Performed on first and last piece of equipment    Resistance Training Performed Yes    VAD Patient? No    PAD/SET Patient? No      Pain Assessment   Currently in Pain? No/denies                Social History   Tobacco Use  Smoking Status Former   Current packs/day: 0.00   Average packs/day: 1 pack/day for 30.0 years (30.0 ttl pk-yrs)   Types: Cigarettes   Start date: 03/05/1983   Quit date: 06/12/2012   Years since quitting: 11.0  Smokeless Tobacco Never  Tobacco Comments   1 ppd for 30 years     Goals Met:  Proper associated with RPD/PD & O2 Sat Independence with exercise equipment Exercise tolerated well No report of concerns or symptoms today  Goals Unmet:  Not Applicable  Comments: Pt able to follow exercise prescription today without complaint.  Will continue to monitor for progression.    Dr. Firman Hughes is Medical Director for [facility] Cardiac Rehabilitation.  Dr. Fuad Aleskerov is Medical Director for [facility] Pulmonary Rehabilitation.

## 2023-06-29 ENCOUNTER — Encounter: Payer: BC Managed Care – PPO | Admitting: *Deleted

## 2023-06-29 DIAGNOSIS — J8410 Pulmonary fibrosis, unspecified: Secondary | ICD-10-CM | POA: Diagnosis not present

## 2023-06-29 NOTE — Progress Notes (Signed)
Daily Session Note  Patient Details  Name: Denise Mason MRN: [id number] Date of Birth: May 23, 1956 Referring Provider:   Flowsheet Row Pulmonary Rehab from 04/25/2023 in [facility] Cardiac and Pulmonary Rehab  Referring Provider Bethann Punches, MD       Encounter Date: 06/29/2023  Check In:  Session Check In - 06/29/23 1139       Check-In   Supervising physician immediately available to respond to emergencies See telemetry face sheet for immediately available ER MD    Location [facility]-Cardiac & Pulmonary Rehab    Staff Present Bess Kinds RN,BSN;Joseph [address] [facility] [place], [place], Exercise Physiologist    Virtual Visit No    Medication changes reported     No    Fall or balance concerns reported    No    Warm-up and Cool-down Performed on first and last piece of equipment    Resistance Training Performed Yes    VAD Patient? No    PAD/SET Patient? No      Pain Assessment   Currently in Pain? No/denies                Social History   Tobacco Use  Smoking Status Former   Current packs/day: 0.00   Average packs/day: 1 pack/day for 30.0 years (30.0 ttl pk-yrs)   Types: Cigarettes   Start date: 03/05/1983   Quit date: 06/12/2012   Years since quitting: 11.0  Smokeless Tobacco Never  Tobacco Comments   1 ppd for 30 years     Goals Met:  Proper associated with RPD/PD & O2 Sat Independence with exercise equipment Exercise tolerated well No report of concerns or symptoms today Strength training completed today  Goals Unmet:  Not Applicable  Comments: Pt able to follow exercise prescription today without complaint.  Will continue to monitor for progression.    Dr. Bethann Punches is Medical Director for [facility] Cardiac Rehabilitation.  Dr. Vida Rigger is Medical Director for [facility] Pulmonary Rehabilitation.

## 2023-07-02 ENCOUNTER — Encounter: Payer: BC Managed Care – PPO | Admitting: *Deleted

## 2023-07-02 DIAGNOSIS — J8410 Pulmonary fibrosis, unspecified: Secondary | ICD-10-CM

## 2023-07-02 NOTE — Progress Notes (Signed)
Daily Session Note  Patient Details  Name: Denise Mason MRN: [id number] Date of Birth: 07/09/1955 Referring Provider:   Flowsheet Row Pulmonary Rehab from 04/25/2023 in [facility] Cardiac and Pulmonary Rehab  Referring Provider Bethann Punches, MD       Encounter Date: 07/02/2023  Check In:  Session Check In - 07/02/23 1110       Check-In   Supervising physician immediately available to respond to emergencies See telemetry face sheet for immediately available ER MD    Location [facility]-Cardiac & Pulmonary Rehab    Staff Present Maxon Conetta BS, Exercise Physiologist;Kelly Cloretta Ned, ACSM CEP, Exercise Physiologist;Margaret Best, MS, Exercise Physiologist;Carnita Golob Jewel Baize RN,BSN    Virtual Visit No    Medication changes reported     No    Fall or balance concerns reported    No    Warm-up and Cool-down Performed on first and last piece of equipment    Resistance Training Performed Yes    VAD Patient? No    PAD/SET Patient? No      Pain Assessment   Currently in Pain? No/denies                Social History   Tobacco Use  Smoking Status Former   Current packs/day: 0.00   Average packs/day: 1 pack/day for 30.0 years (30.0 ttl pk-yrs)   Types: Cigarettes   Start date: 03/05/1983   Quit date: 06/12/2012   Years since quitting: 11.0  Smokeless Tobacco Never  Tobacco Comments   1 ppd for 30 years     Goals Met:  Independence with exercise equipment Exercise tolerated well No report of concerns or symptoms today Strength training completed today  Goals Unmet:  Not Applicable  Comments: Pt able to follow exercise prescription today without complaint.  Will continue to monitor for progression.    Dr. Bethann Punches is Medical Director for [facility] Cardiac Rehabilitation.  Dr. Vida Rigger is Medical Director for [facility] Pulmonary Rehabilitation.

## 2023-07-06 ENCOUNTER — Encounter: Payer: BC Managed Care – PPO | Admitting: *Deleted

## 2023-07-06 DIAGNOSIS — J8410 Pulmonary fibrosis, unspecified: Secondary | ICD-10-CM | POA: Diagnosis not present

## 2023-07-06 NOTE — Progress Notes (Signed)
Daily Session Note  Patient Details  Name: Denise Mason MRN: [id number] Date of Birth: Jan 27, 1956 Referring Provider:   Flowsheet Row Pulmonary Rehab from 04/25/2023 in [facility] Cardiac and Pulmonary Rehab  Referring Provider Bethann Punches, MD       Encounter Date: 07/06/2023  Check In:  Session Check In - 07/06/23 1116       Check-In   Supervising physician immediately available to respond to emergencies See telemetry face sheet for immediately available ER MD    Location [facility]-Cardiac & Pulmonary Rehab    Staff Present Cora Collum, RN, BSN, CCRP;Joseph Hood RCP,RRT,BSRT;Noah Tickle, [place], Exercise Physiologist    Virtual Visit No    Medication changes reported     No    Fall or balance concerns reported    No    Warm-up and Cool-down Performed on first and last piece of equipment    Resistance Training Performed Yes    VAD Patient? No    PAD/SET Patient? No      Pain Assessment   Currently in Pain? No/denies                Social History   Tobacco Use  Smoking Status Former   Current packs/day: 0.00   Average packs/day: 1 pack/day for 30.0 years (30.0 ttl pk-yrs)   Types: Cigarettes   Start date: 03/05/1983   Quit date: 06/12/2012   Years since quitting: 11.0  Smokeless Tobacco Never  Tobacco Comments   1 ppd for 30 years     Goals Met:  Proper associated with RPD/PD & O2 Sat Independence with exercise equipment Exercise tolerated well No report of concerns or symptoms today  Goals Unmet:  Not Applicable  Comments: Pt able to follow exercise prescription today without complaint.  Will continue to monitor for progression.    Dr. Bethann Punches is Medical Director for [facility] Cardiac Rehabilitation.  Dr. Vida Rigger is Medical Director for [facility] Pulmonary Rehabilitation.

## 2023-07-09 ENCOUNTER — Encounter: Payer: BC Managed Care – PPO | Admitting: *Deleted

## 2023-07-09 DIAGNOSIS — J8410 Pulmonary fibrosis, unspecified: Secondary | ICD-10-CM

## 2023-07-09 NOTE — Progress Notes (Signed)
Daily Session Note  Patient Details  Name: Denise Mason MRN: [id number] Date of Birth: [birth year] Referring Provider:   Flowsheet Row Pulmonary Rehab from 04/25/2023 in [facility] Cardiac and Pulmonary Rehab  Referring Provider Bethann Punches, MD       Encounter Date: 07/09/2023  Check In:  Session Check In - 07/09/23 1134       Check-In   Supervising physician immediately available to respond to emergencies See telemetry face sheet for immediately available ER MD    Location [facility]-Cardiac & Pulmonary Rehab    Staff Present Rory Percy, MS, Exercise Physiologist;Thong Feeny, RN, BSN, CCRP;Kelly Hayes BS, ACSM CEP, Exercise Physiologist;Maxon Conetta BS, Exercise Physiologist    Virtual Visit No    Medication changes reported     No    Fall or balance concerns reported    No    Warm-up and Cool-down Performed on first and last piece of equipment    Resistance Training Performed Yes    VAD Patient? No    PAD/SET Patient? No      Pain Assessment   Currently in Pain? No/denies                Social History   Tobacco Use  Smoking Status Former   Current packs/day: 0.00   Average packs/day: 1 pack/day for 30.0 years (30.0 ttl pk-yrs)   Types: Cigarettes   Start date: 03/05/1983   Quit date: 06/12/2012   Years since quitting: 11.0  Smokeless Tobacco Never  Tobacco Comments   1 ppd for 30 years     Goals Met:  Proper associated with RPD/PD & O2 Sat Independence with exercise equipment Exercise tolerated well No report of concerns or symptoms today  Goals Unmet:  Not Applicable  Comments: Pt able to follow exercise prescription today without complaint.  Will continue to monitor for progression.    Dr. Bethann Punches is Medical Director for [facility] Cardiac Rehabilitation.  Dr. Vida Rigger is Medical Director for [facility] Pulmonary Rehabilitation.

## 2023-07-11 DIAGNOSIS — J8410 Pulmonary fibrosis, unspecified: Secondary | ICD-10-CM

## 2023-07-11 NOTE — Progress Notes (Signed)
Assessment start time: 10:05 AM  Digestive issues/concerns: no known food allergies  24-hours Recall: B: sausage OR [place] bacon with a scrambled egg L: sometimes nothing, but sometimes with salad with grilled chicken D: grilled chicken, stewed apples, green beans  Beverages Water (64-80oz),half cup coffee, unsweet tea   Education r/t nutrition plan Patient drinking ~64-80oz of water daily. She typically eats 3 meals per day. Eats out a few times per week. Usually a steakhouse once per week. She wanted to learn how to eat healthier. Reviewed mediterranean diet handout and educated on various nutrition topics like fiber, sodium, cholesterol, and types of fats. Encouraged her to keep animal portions closer to 3oz rather than the [employer] she might have at the steak house. Also reminded her not to over commit to one nutrient and forget about the others. Is a goal she would like to set is to eat both a complex carb and a protein at meals. She enjoys salads and veggies so feels she can do better with including more colorful produce.    Goal 1: Eat 15-30gProtein and 30-60gCarbs at each meal. Goal 2: Include more colorful produce, aim for 5-8 servings of fruits and veggies per day Goal 3: Read labels and reduce sodium intake to below 2300mg . Ideally 1500mg  per day.   End time 10:52 AM

## 2023-07-11 NOTE — Progress Notes (Signed)
Daily Session Note  Patient Details  Name: Denise Mason MRN: [id number] Date of Birth: [birth year] Referring Provider:   Flowsheet Row Pulmonary Rehab from 04/25/2023 in [facility] Cardiac and Pulmonary Rehab  Referring Provider Bethann Punches, MD       Encounter Date: 07/11/2023  Check In:  Session Check In - 07/11/23 1114       Check-In   Supervising physician immediately available to respond to emergencies See telemetry face sheet for immediately available ER MD    Location [facility]-Cardiac & Pulmonary Rehab    Staff Present Kelton Pillar RN,BSN,MPA;Margaret Best, MS, Exercise Physiologist;Joseph Reino Kent RCP,RRT,BSRT;Meredith Jewel Baize RN,BSN    Virtual Visit No    Medication changes reported     No    Fall or balance concerns reported    No    Warm-up and Cool-down Performed on first and last piece of equipment    Resistance Training Performed Yes    VAD Patient? No    PAD/SET Patient? No      Pain Assessment   Currently in Pain? No/denies                Social History   Tobacco Use  Smoking Status Former   Current packs/day: 0.00   Average packs/day: 1 pack/day for 30.0 years (30.0 ttl pk-yrs)   Types: Cigarettes   Start date: 03/05/1983   Quit date: 06/12/2012   Years since quitting: 11.0  Smokeless Tobacco Never  Tobacco Comments   1 ppd for 30 years     Goals Met:  Independence with exercise equipment Exercise tolerated well No report of concerns or symptoms today Strength training completed today  Goals Unmet:  Not Applicable  Comments: Pt able to follow exercise prescription today without complaint.  Will continue to monitor for progression.    Dr. Bethann Punches is Medical Director for [facility] Cardiac Rehabilitation.  Dr. Vida Rigger is Medical Director for [facility] Pulmonary Rehabilitation.

## 2023-07-13 ENCOUNTER — Encounter: Payer: BC Managed Care – PPO | Admitting: *Deleted

## 2023-07-13 DIAGNOSIS — J8410 Pulmonary fibrosis, unspecified: Secondary | ICD-10-CM | POA: Diagnosis not present

## 2023-07-13 NOTE — Progress Notes (Signed)
Daily Session Note  Patient Details  Name: Denise Mason MRN: [id number] Date of Birth: 09-Oct-1955 Referring Provider:   Flowsheet Row Pulmonary Rehab from 04/25/2023 in [facility] Cardiac and Pulmonary Rehab  Referring Provider Bethann Punches, MD       Encounter Date: 07/13/2023  Check In:  Session Check In - 07/13/23 1116       Check-In   Supervising physician immediately available to respond to emergencies See telemetry face sheet for immediately available ER MD    Location [facility]-Cardiac & Pulmonary Rehab    Staff Present Cora Collum, RN, BSN, CCRP;Joseph Hood RCP,RRT,BSRT;Noah Tickle, [place], Exercise Physiologist    Virtual Visit No    Medication changes reported     No    Fall or balance concerns reported    No    Warm-up and Cool-down Performed on first and last piece of equipment    Resistance Training Performed Yes    VAD Patient? No    PAD/SET Patient? No      Pain Assessment   Currently in Pain? No/denies                Social History   Tobacco Use  Smoking Status Former   Current packs/day: 0.00   Average packs/day: 1 pack/day for 30.0 years (30.0 ttl pk-yrs)   Types: Cigarettes   Start date: 03/05/1983   Quit date: 06/12/2012   Years since quitting: 11.0  Smokeless Tobacco Never  Tobacco Comments   1 ppd for 30 years     Goals Met:  Proper associated with RPD/PD & O2 Sat Independence with exercise equipment Exercise tolerated well No report of concerns or symptoms today  Goals Unmet:  Not Applicable  Comments: Pt able to follow exercise prescription today without complaint.  Will continue to monitor for progression.    Dr. Bethann Punches is Medical Director for [facility] Cardiac Rehabilitation.  Dr. Vida Rigger is Medical Director for [facility] Pulmonary Rehabilitation.

## 2023-07-19 ENCOUNTER — Other Ambulatory Visit: Payer: Self-pay | Admitting: Internal Medicine

## 2023-07-19 DIAGNOSIS — Z1231 Encounter for screening mammogram for malignant neoplasm of breast: Secondary | ICD-10-CM

## 2023-07-23 ENCOUNTER — Encounter: Payer: BC Managed Care – PPO | Attending: Internal Medicine | Admitting: *Deleted

## 2023-07-23 DIAGNOSIS — J8410 Pulmonary fibrosis, unspecified: Secondary | ICD-10-CM | POA: Insufficient documentation

## 2023-07-23 NOTE — Progress Notes (Signed)
 Daily Session Note  Patient Details  Name: Denise Mason MRN: [id number] Date of Birth: 1955-06-21 Referring Provider:   Flowsheet Row Pulmonary Rehab from 04/25/2023 in [facility] Cardiac and Pulmonary Rehab  Referring Provider Firman Hughes, MD       Encounter Date: 07/23/2023  Check In:  Session Check In - 07/23/23 1124       Check-In   Supervising physician immediately available to respond to emergencies See telemetry face sheet for immediately available ER MD    Location [facility]-Cardiac & Pulmonary Rehab    Staff Present Lyell Samuel, MS, Exercise Physiologist;Maxon Beauford Bounds, Exercise Physiologist;Meredith Manson Seitz RN,BSN;Denay Pleitez, RN, BSN, CCRP;Kelly Hayes BS, ACSM CEP, Exercise Physiologist    Virtual Visit No    Medication changes reported     No    Fall or balance concerns reported    No    Warm-up and Cool-down Performed on first and last piece of equipment    Resistance Training Performed Yes    VAD Patient? No    PAD/SET Patient? No      Pain Assessment   Currently in Pain? No/denies                Social History   Tobacco Use  Smoking Status Former   Current packs/day: 0.00   Average packs/day: 1 pack/day for 30.0 years (30.0 ttl pk-yrs)   Types: Cigarettes   Start date: 03/05/1983   Quit date: 06/12/2012   Years since quitting: 11.1  Smokeless Tobacco Never  Tobacco Comments   1 ppd for 30 years     Goals Met:  Proper associated with RPD/PD & O2 Sat Independence with exercise equipment Exercise tolerated well No report of concerns or symptoms today  Goals Unmet:  Not Applicable  Comments: Pt able to follow exercise prescription today without complaint.  Will continue to monitor for progression.    Dr. Firman Hughes is Medical Director for [facility] Cardiac Rehabilitation.  Dr. Fuad Aleskerov is Medical Director for [facility] Pulmonary Rehabilitation.

## 2023-07-25 ENCOUNTER — Encounter: Payer: BC Managed Care – PPO | Admitting: *Deleted

## 2023-07-25 DIAGNOSIS — J8410 Pulmonary fibrosis, unspecified: Secondary | ICD-10-CM

## 2023-07-25 NOTE — Progress Notes (Signed)
Daily Session Note  Patient Details  Name: Denise Mason MRN: [id number] Date of Birth: 1956-01-30 Referring Provider:   Flowsheet Row Pulmonary Rehab from 04/25/2023 in [facility] Cardiac and Pulmonary Rehab  Referring Provider Bethann Punches, MD       Encounter Date: 07/25/2023  Check In:  Session Check In - 07/25/23 1125       Check-In   Supervising physician immediately available to respond to emergencies See telemetry face sheet for immediately available ER MD    Location [facility]-Cardiac & Pulmonary Rehab    Staff Present Susann Givens RN,BSN;[facility] RCP,RRT,BSRT;Margaret Best, MS, Exercise Physiologist    Virtual Visit No    Medication changes reported     No    Warm-up and Cool-down Performed on first and last piece of equipment    Resistance Training Performed Yes    VAD Patient? No    PAD/SET Patient? No      Pain Assessment   Currently in Pain? No/denies                Social History   Tobacco Use  Smoking Status Former   Current packs/day: 0.00   Average packs/day: 1 pack/day for 30.0 years (30.0 ttl pk-yrs)   Types: Cigarettes   Start date: 03/05/1983   Quit date: 06/12/2012   Years since quitting: 11.1  Smokeless Tobacco Never  Tobacco Comments   1 ppd for 30 years     Goals Met:  Independence with exercise equipment Exercise tolerated well No report of concerns or symptoms today Strength training completed today  Goals Unmet:  Not Applicable  Comments: Pt able to follow exercise prescription today without complaint.  Will continue to monitor for progression.    Dr. Bethann Punches is Medical Director for [facility] Cardiac Rehabilitation.  Dr. Vida Rigger is Medical Director for [facility] Pulmonary Rehabilitation.

## 2023-07-25 NOTE — Progress Notes (Signed)
 Pulmonary Individual Treatment Plan  Patient Details  Name: EDESSA JAKUBOWICZ MRN: [id number] Date of Birth: Jan 19, 1956 Referring Provider:   Flowsheet Row Pulmonary Rehab from 04/25/2023 in [facility] Cardiac and Pulmonary Rehab  Referring Provider Bethann Punches, MD       Initial Encounter Date:  Flowsheet Row Pulmonary Rehab from 04/25/2023 in [facility] Cardiac and Pulmonary Rehab  Date 04/25/23       Visit Diagnosis: Pulmonary fibrosis (HCC)  Patient's Home Medications on Admission:  Current Outpatient Medications:    albuterol (ACCUNEB) 1.25 MG/3ML nebulizer solution, Take 1 ampule by nebulization every 6 (six) hours as needed., Disp: , Rfl:    albuterol (VENTOLIN HFA) 108 (90 Base) MCG/ACT inhaler, Inhale into the lungs., Disp: , Rfl:    alendronate (FOSAMAX) 70 MG tablet, Take 1 tablet (70 mg total) by mouth every 7 (seven) days. Take with a full glass of water on an empty stomach., Disp: 12 tablet, Rfl: 3   ALPRAZolam (XANAX) 0.5 MG tablet, Take 1 tablet (0.5 mg total) by mouth 2 (two) times daily as needed. for anxiety (Patient not taking: Reported on 04/19/2023), Disp: 10 tablet, Rfl: 0   ammonium lactate (LAC-HYDRIN) 12 % lotion, APPLY TOPICALLY AS NEEDED FOR DRY SKIN (Patient not taking: Reported on 04/19/2023), Disp: 400 g, Rfl: 0   ARMOUR THYROID 30 MG tablet, TAKE 1 TABLET BY MOUTH EVERY DAY EXCEPT TAKE 1 1/2 TABLETS ON SUNDAY (Patient not taking: Reported on 04/19/2023), Disp: 90 tablet, Rfl: 2   aspirin EC 81 MG tablet, Take 1 tablet (81 mg total) by mouth daily. (Patient not taking: Reported on 04/19/2023), Disp: 30 tablet, Rfl: 5   Coenzyme Q10 (COQ-10) 100 MG CAPS, Take 1 capsule by mouth 3 (three) times a week. (Patient not taking: Reported on 04/19/2023), Disp: 100 capsule, Rfl: 0   DULoxetine (CYMBALTA) 60 MG capsule, Take 1 capsule (60 mg total) by mouth daily. (Patient not taking: Reported on 04/19/2023), Disp: 90 capsule, Rfl: 1   ezetimibe (ZETIA) 10 MG tablet, Take 1 tablet (10  mg total) by mouth daily. For cholesterol (Patient not taking: Reported on 04/19/2023), Disp: 90 tablet, Rfl: 1   Fluticasone-Umeclidin-Vilant (TRELEGY ELLIPTA) 100-62.5-25 MCG/ACT AEPB, Inhale into the lungs., Disp: , Rfl:    gabapentin (NEURONTIN) 300 MG capsule, Take 1 capsule (300 mg total) by mouth at bedtime. (Patient not taking: Reported on 04/19/2023), Disp: 90 capsule, Rfl: 1   hydrOXYzine (ATARAX/VISTARIL) 10 MG tablet, Take 1 tablet (10 mg total) by mouth 3 (three) times daily as needed. (Patient not taking: Reported on 04/19/2023), Disp: 30 tablet, Rfl: 0   Krill Oil Omega-3 500 MG CAPS, Take 1 capsule by mouth daily. (Patient not taking: Reported on 04/19/2023), Disp: 30 capsule, Rfl: 0   levothyroxine (SYNTHROID) 50 MCG tablet, Take by mouth., Disp: , Rfl:    nitroGLYCERIN (NITROSTAT) 0.4 MG SL tablet, Place 1 tablet (0.4 mg total) under the tongue every 5 (five) minutes as needed for chest pain. (Patient not taking: Reported on 04/19/2023), Disp: 20 tablet, Rfl: 2   PREMARIN vaginal cream, INSERT 1 APPLICATORFUL VAGINALLY 2 TIMES A WEEK (Patient not taking: Reported on 04/19/2023), Disp: 30 g, Rfl: 10   rosuvastatin (CRESTOR) 10 MG tablet, Take 1 tablet (10 mg total) by mouth 3 (three) times a week. TAKE 1 TABLET(10 MG) BY MOUTH DAILY, Disp: 36 tablet, Rfl: 1   valACYclovir (VALTREX) 1000 MG tablet, Take 1 tablet (1,000 mg total) by mouth 2 (two) times daily. (Patient not taking: Reported on  02/26/2020), Disp: 10 tablet, Rfl: 0  Past Medical History: Past Medical History:  Diagnosis Date   ADD (attention deficit disorder with hyperactivity)    CAD (coronary artery disease)    mild, non-obstructive. by cath 02/16/09 (luminal irreg mid LAD and mid RCA)    Cancer (HCC)    basil   Depression    HLD (hyperlipidemia)    Hypothyroidism    Osteopenia     Tobacco Use: Social History   Tobacco Use  Smoking Status Former   Current packs/day: 0.00   Average packs/day: 1 pack/day for 30.0  years (30.0 ttl pk-yrs)   Types: Cigarettes   Start date: 03/05/1983   Quit date: 06/12/2012   Years since quitting: 11.1  Smokeless Tobacco Never  Tobacco Comments   1 ppd for 30 years     Labs: Review Flowsheet  More data exists      Latest Ref Rng & Units 03/08/2015 03/31/2016 11/21/2016 11/27/2017 06/27/2019  Labs for ITP Cardiac and Pulmonary Rehab  Cholestrol <200 mg/dL 657  846  962  952  841   LDL (calc) mg/dL (calc) 324  401  027  253  156   HDL-C > OR = 50 mg/dL 48  50  52  53  43   Trlycerides <150 mg/dL 664  403  474  259  563   Hemoglobin A1c <5.7 % of total Hgb - 5.6  - 5.5  5.8      Pulmonary Assessment Scores:  Pulmonary Assessment Scores     Row Name 04/25/23 1523         ADL UCSD   ADL Phase Entry     SOB Score total 48     Rest 1     Walk 2     Stairs 5     Bath 0     Dress 0     Shop 2       CAT Score   CAT Score 17       mMRC Score   mMRC Score 1              UCSD: Self-administered rating of dyspnea associated with activities of daily living (ADLs) 6-point scale (0 = "not at all" to 5 = "maximal or unable to do because of breathlessness")  Scoring Scores range from 0 to 120.  Minimally important difference is 5 units  CAT: CAT can identify the health impairment of COPD patients and is better correlated with disease progression.  CAT has a scoring range of zero to 40. The CAT score is classified into four groups of low (less than 10), medium (10 - 20), high (21-30) and very high (31-40) based on the impact level of disease on health status. A CAT score over 10 suggests significant symptoms.  A worsening CAT score could be explained by an exacerbation, poor medication adherence, poor inhaler technique, or progression of COPD or comorbid conditions.  CAT MCID is 2 points  mMRC: mMRC (Modified Medical Research Council) Dyspnea Scale is used to assess the degree of baseline functional disability in patients of respiratory disease due to  dyspnea. No minimal important difference is established. A decrease in score of 1 point or greater is considered a positive change.   Pulmonary Function Assessment:   Exercise Target Goals: Exercise Program Goal: Individual exercise prescription set using results from initial 6 min walk test and THRR while considering  patient's activity barriers and safety.   Exercise Prescription Goal: Initial exercise  prescription builds to 30-45 minutes a day of aerobic activity, 2-3 days per week.  Home exercise guidelines will be given to patient during program as part of exercise prescription that the participant will acknowledge.  Education: Aerobic Exercise: - Group verbal and visual presentation on the components of exercise prescription. Introduces F.I.T.T principle from ACSM for exercise prescriptions.  Reviews F.I.T.T. principles of aerobic exercise including progression. Written material given at graduation.   Education: Resistance Exercise: - Group verbal and visual presentation on the components of exercise prescription. Introduces F.I.T.T principle from ACSM for exercise prescriptions  Reviews F.I.T.T. principles of resistance exercise including progression. Written material given at graduation.    Education: Exercise & Equipment Safety: - Individual verbal instruction and demonstration of equipment use and safety with use of the equipment. Flowsheet Row Pulmonary Rehab from 04/25/2023 in [facility] Cardiac and Pulmonary Rehab  Date 04/25/23  Educator MB  Instruction Review Code 1- Verbalizes Understanding       Education: Exercise Physiology & General Exercise Guidelines: - Group verbal and written instruction with models to review the exercise physiology of the cardiovascular system and associated critical values. Provides general exercise guidelines with specific guidelines to those with heart or lung disease.    Education: Flexibility, Balance, Mind/Body Relaxation: - Group verbal  and visual presentation with interactive activity on the components of exercise prescription. Introduces F.I.T.T principle from ACSM for exercise prescriptions. Reviews F.I.T.T. principles of flexibility and balance exercise training including progression. Also discusses the mind body connection.  Reviews various relaxation techniques to help reduce and manage stress (i.e. Deep breathing, progressive muscle relaxation, and visualization). Balance handout provided to take home. Written material given at graduation.   Activity Barriers & Risk Stratification:  Activity Barriers & Cardiac Risk Stratification - 04/25/23 1518       Activity Barriers & Cardiac Risk Stratification   Activity Barriers Joint Problems;Shortness of Breath;Other (comment)    Comments Osteoporosis             6 Minute Walk:  6 Minute Walk     Row Name 04/25/23 1516         6 Minute Walk   Phase Initial     Distance 1225 feet     Walk Time 6 minutes     # of Rest Breaks 0     MPH 2.3     METS 2.81     RPE 11     Perceived Dyspnea  1     VO2 Peak 9.83     Symptoms No     Resting HR 88 bpm     Resting BP 110/70     Resting Oxygen Saturation  96 %     Exercise Oxygen Saturation  during 6 min walk 87 %     Max Ex. HR 116 bpm     Max Ex. BP 112/76     2 Minute Post BP 108/72       Interval HR   1 Minute HR 110     2 Minute HR 112     3 Minute HR 112     4 Minute HR 113     5 Minute HR 116     6 Minute HR 114     2 Minute Post HR 95     Interval Heart Rate? Yes       Interval Oxygen   Interval Oxygen? Yes     Baseline Oxygen Saturation % 96 %     1  Minute Oxygen Saturation % 93 %     1 Minute Liters of Oxygen 0 L     2 Minute Oxygen Saturation % 87 %     2 Minute Liters of Oxygen 0 L     3 Minute Oxygen Saturation % 87 %     3 Minute Liters of Oxygen 0 L     4 Minute Oxygen Saturation % 87 %     4 Minute Liters of Oxygen 0 L     5 Minute Oxygen Saturation % 88 %     5 Minute Liters of  Oxygen 0 L     6 Minute Oxygen Saturation % 88 %     6 Minute Liters of Oxygen 0 L     2 Minute Post Oxygen Saturation % 95 %     2 Minute Post Liters of Oxygen 0 L             Oxygen Initial Assessment:  Oxygen Initial Assessment - 04/19/23 1335       Home Oxygen   Home Oxygen Device None    Sleep Oxygen Prescription None    Home Exercise Oxygen Prescription None    Home Resting Oxygen Prescription None    Compliance with Home Oxygen Use Yes      Intervention   Short Term Goals To learn and understand importance of monitoring SPO2 with pulse oximeter and demonstrate accurate use of the pulse oximeter.;To learn and understand importance of maintaining oxygen saturations>88%;To learn and demonstrate proper pursed lip breathing techniques or other breathing techniques. ;To learn and demonstrate proper use of respiratory medications    Long  Term Goals Verbalizes importance of monitoring SPO2 with pulse oximeter and return demonstration;Maintenance of O2 saturations>88%;Exhibits proper breathing techniques, such as pursed lip breathing or other method taught during program session;Compliance with respiratory medication;Demonstrates proper use of MDI's             Oxygen Re-Evaluation:  Oxygen Re-Evaluation     Row Name 05/07/23 1118 06/01/23 1117           Program Oxygen Prescription   Program Oxygen Prescription -- None        Home Oxygen   Home Oxygen Device -- None      Sleep Oxygen Prescription -- None      Home Exercise Oxygen Prescription -- None      Home Resting Oxygen Prescription -- None        Goals/Expected Outcomes   Short Term Goals -- To learn and understand importance of maintaining oxygen saturations>88%;To learn and understand importance of monitoring SPO2 with pulse oximeter and demonstrate accurate use of the pulse oximeter.      Long  Term Goals -- Maintenance of O2 saturations>88%;Verbalizes importance of monitoring SPO2 with pulse oximeter and  return demonstration      Comments Reviewed PLB technique with pt.  Talked about how it works and it's importance in maintaining their exercise saturations. She has a pulse oximeter to check her oxygen saturation at home. Informed and explained why it is important to have one. Reviewed that oxygen saturations should be 88 percent and above. Patient verbalizes understanding.      Goals/Expected Outcomes Short: Become more profiecient at using PLB. Long: Become independent at using PLB. Short: monitor oxygen at home with exertion. Long: maintain oxygen saturations above 88 percent independently.               Oxygen Discharge (Final Oxygen Re-Evaluation):  Oxygen  Re-Evaluation - 06/01/23 1117       Program Oxygen Prescription   Program Oxygen Prescription None      Home Oxygen   Home Oxygen Device None    Sleep Oxygen Prescription None    Home Exercise Oxygen Prescription None    Home Resting Oxygen Prescription None      Goals/Expected Outcomes   Short Term Goals To learn and understand importance of maintaining oxygen saturations>88%;To learn and understand importance of monitoring SPO2 with pulse oximeter and demonstrate accurate use of the pulse oximeter.    Long  Term Goals Maintenance of O2 saturations>88%;Verbalizes importance of monitoring SPO2 with pulse oximeter and return demonstration    Comments She has a pulse oximeter to check her oxygen saturation at home. Informed and explained why it is important to have one. Reviewed that oxygen saturations should be 88 percent and above. Patient verbalizes understanding.    Goals/Expected Outcomes Short: monitor oxygen at home with exertion. Long: maintain oxygen saturations above 88 percent independently.             Initial Exercise Prescription:  Initial Exercise Prescription - 04/25/23 1500       Date of Initial Exercise RX and Referring Provider   Date 04/25/23    Referring Provider Bethann Punches, MD      Oxygen    Maintain Oxygen Saturation 88% or higher      Treadmill   MPH 2.3    Grade 0    Minutes 15    METs 2.76      Elliptical   Level 1   Try Elliptical   Speed 3    Minutes 15    METs 2.81      REL-XR   Level 2    Speed 50    Minutes 15    METs 2.81      T5 Nustep   Level 2    SPM 80    Minutes 15    METs 2.81      Track   Laps 33    Minutes 15    METs 2.79      Prescription Details   Frequency (times per week) 3    Duration Progress to 30 minutes of continuous aerobic without signs/symptoms of physical distress      Intensity   THRR 40-80% of Max Heartrate 114-141    Ratings of Perceived Exertion 11-13    Perceived Dyspnea 0-4      Progression   Progression Continue to progress workloads to maintain intensity without signs/symptoms of physical distress.      Resistance Training   Training Prescription Yes    Weight 2 lb    Reps 10-15             Perform Capillary Blood Glucose checks as needed.  Exercise Prescription Changes:   Exercise Prescription Changes     Row Name 04/25/23 1500 05/23/23 1400 06/05/23 0900 06/20/23 1200 06/21/23 1600     Response to Exercise   Blood Pressure (Admit) 110/70 102/64 112/62 -- 112/64   Blood Pressure (Exercise) 112/76 122/60 134/74 -- --   Blood Pressure (Exit) 108/72 100/60 110/64 -- 104/60   Heart Rate (Admit) 88 bpm 95 bpm 89 bpm -- 95 bpm   Heart Rate (Exercise) 116 bpm 125 bpm 120 bpm -- 116 bpm   Heart Rate (Exit) 95 bpm 105 bpm 102 bpm -- 107 bpm   Oxygen Saturation (Admit) 96 % 93 % 95 % -- 94 %  Oxygen Saturation (Exercise) 87 % 88 % 88 % -- 89 %   Oxygen Saturation (Exit) 95 % 94 % 94 % -- 94 %   Rating of Perceived Exertion (Exercise) 11 15 13  -- 13   Perceived Dyspnea (Exercise) 1 1 0 -- 0   Symptoms none none none -- none   Comments results First three days of exercise -- -- --   Duration Progress to 30 minutes of  aerobic without signs/symptoms of physical distress Continue with 30 min of  aerobic exercise without signs/symptoms of physical distress. Continue with 30 min of aerobic exercise without signs/symptoms of physical distress. Continue with 30 min of aerobic exercise without signs/symptoms of physical distress. Continue with 30 min of aerobic exercise without signs/symptoms of physical distress.   Intensity THRR New THRR unchanged THRR unchanged THRR unchanged THRR unchanged     Progression   Progression Continue to progress workloads to maintain intensity without signs/symptoms of physical distress. Continue to progress workloads to maintain intensity without signs/symptoms of physical distress. Continue to progress workloads to maintain intensity without signs/symptoms of physical distress. Continue to progress workloads to maintain intensity without signs/symptoms of physical distress. Continue to progress workloads to maintain intensity without signs/symptoms of physical distress.   Average METs 2.81 2.45 2.56 2.56 2.57     Resistance Training   Training Prescription -- Yes Yes Yes Yes   Weight -- 2 lb 2 lb 2 lb 3 lb   Reps -- 10-15 10-15 10-15 10-15     Interval Training   Interval Training -- No No No No     Oxygen   Oxygen -- Continuous -- -- --     Treadmill   MPH -- 2.7 2.8 2.8 2.7   Grade -- 0 0 0 0   Minutes -- 15 15 15 15    METs -- 3.07 3.14 3.14 3.07     NuStep   Level -- 3 1 1  --   Minutes -- 15 15 15  --   METs -- 2.9 2.2 2.2 --     Arm Ergometer   Level -- 1 -- -- --   Minutes -- 15 -- -- --   METs -- 1 -- -- --     T5 Nustep   Level -- 2 1 1 1    Minutes -- 15 15 15 15    METs -- 2 2.2 2.2 2.2     Biostep-RELP   Level -- -- 1 1 --   SPM -- -- 50 50 --   Minutes -- -- 15 15 --   METs -- -- 2 2 --     Home Exercise Plan   Plans to continue exercise at -- -- -- Home (comment)  walk, hand weights, and possibly join the wellzone Home (comment)  walk, hand weights, and possibly join the wellzone   Frequency -- -- -- Add 2 additional days  to program exercise sessions. Add 2 additional days to program exercise sessions.   Initial Home Exercises Provided -- -- -- 06/20/23 06/20/23     Oxygen   Maintain Oxygen Saturation -- 88% or higher 88% or higher 88% or higher 88% or higher    Row Name 07/02/23 1600 07/17/23 1400           Response to Exercise   Blood Pressure (Admit) 110/60 114/62      Blood Pressure (Exit) 102/60 104/64      Heart Rate (Admit) 91 bpm 103 bpm  Heart Rate (Exercise) 118 bpm 124 bpm      Heart Rate (Exit) 103 bpm 111 bpm      Oxygen Saturation (Admit) 94 % 94 %      Oxygen Saturation (Exercise) 89 % 87 %      Oxygen Saturation (Exit) 93 % 93 %      Rating of Perceived Exertion (Exercise) 12 13      Perceived Dyspnea (Exercise) 0 0      Symptoms none none      Duration Continue with 30 min of aerobic exercise without signs/symptoms of physical distress. Continue with 30 min of aerobic exercise without signs/symptoms of physical distress.      Intensity THRR unchanged THRR unchanged        Progression   Progression Continue to progress workloads to maintain intensity without signs/symptoms of physical distress. Continue to progress workloads to maintain intensity without signs/symptoms of physical distress.      Average METs 3.09 3.23        Resistance Training   Training Prescription Yes Yes      Weight 3 lb 3 lb      Reps 10-15 10-15        Interval Training   Interval Training No No        Treadmill   MPH 3 3.1      Grade 0 0      Minutes 15 15      METs 3.3 3.37        NuStep   Level 4 2      Minutes 15 15      METs 3.1 3.6        REL-XR   Level 1 2      Minutes 15 15      METs 3.4 3.2        T5 Nustep   Level 1 2      Minutes 15 15      METs 2.2 2.2        Home Exercise Plan   Plans to continue exercise at Home (comment)  walk, hand weights, and possibly join the wellzone Home (comment)  walk, hand weights, and possibly join the wellzone      Frequency Add 2  additional days to program exercise sessions. Add 2 additional days to program exercise sessions.      Initial Home Exercises Provided 06/20/23 06/20/23        Oxygen   Maintain Oxygen Saturation 88% or higher 88% or higher               Exercise Comments:   Exercise Comments     Row Name 05/07/23 1117           Exercise Comments First full day of exercise!  Patient was oriented to gym and equipment including functions, settings, policies, and procedures.  Patient's individual exercise prescription and treatment plan were reviewed.  All starting workloads were established based on the results of the 6 minute walk test done at initial orientation visit.  The plan for exercise progression was also introduced and progression will be customized based on patient's performance and goals.                Exercise Goals and Review:   Exercise Goals     Row Name 04/25/23 1522             Exercise Goals   Increase Physical Activity Yes  Intervention Provide advice, education, support and counseling about physical activity/exercise needs.;Develop an individualized exercise prescription for aerobic and resistive training based on initial evaluation findings, risk stratification, comorbidities and participant's personal goals.       Expected Outcomes Short Term: Attend rehab on a regular basis to increase amount of physical activity.;Long Term: Add in home exercise to make exercise part of routine and to increase amount of physical activity.;Long Term: Exercising regularly at least 3-5 days a week.       Increase Strength and Stamina Yes       Intervention Provide advice, education, support and counseling about physical activity/exercise needs.;Develop an individualized exercise prescription for aerobic and resistive training based on initial evaluation findings, risk stratification, comorbidities and participant's personal goals.       Expected Outcomes Short Term: Increase  workloads from initial exercise prescription for resistance, speed, and METs.;Short Term: Perform resistance training exercises routinely during rehab and add in resistance training at home;Long Term: Improve cardiorespiratory fitness, muscular endurance and strength as measured by increased METs and functional capacity ( )       Able to understand and use rate of perceived exertion (RPE) scale Yes       Intervention Provide education and explanation on how to use RPE scale       Expected Outcomes Short Term: Able to use RPE daily in rehab to express subjective intensity level;Long Term:  Able to use RPE to guide intensity level when exercising independently       Able to understand and use Dyspnea scale Yes       Intervention Provide education and explanation on how to use Dyspnea scale       Expected Outcomes Short Term: Able to use Dyspnea scale daily in rehab to express subjective sense of shortness of breath during exertion;Long Term: Able to use Dyspnea scale to guide intensity level when exercising independently       Knowledge and understanding of Target Heart Rate Range (THRR) Yes       Intervention Provide education and explanation of THRR including how the numbers were predicted and where they are located for reference       Expected Outcomes Short Term: Able to state/look up THRR;Short Term: Able to use daily as guideline for intensity in rehab;Long Term: Able to use THRR to govern intensity when exercising independently       Able to check pulse independently Yes       Intervention Provide education and demonstration on how to check pulse in carotid and radial arteries.;Review the importance of being able to check your own pulse for safety during independent exercise       Expected Outcomes Short Term: Able to explain why pulse checking is important during independent exercise;Long Term: Able to check pulse independently and accurately       Understanding of Exercise Prescription Yes        Intervention Provide education, explanation, and written materials on patient's individual exercise prescription       Expected Outcomes Short Term: Able to explain program exercise prescription;Long Term: Able to explain home exercise prescription to exercise independently                Exercise Goals Re-Evaluation :  Exercise Goals Re-Evaluation     Row Name 05/07/23 1117 05/23/23 1424 06/05/23 0943 06/20/23 1205 06/21/23 1604     Exercise Goal Re-Evaluation   Exercise Goals Review Able to understand and use rate of perceived exertion (RPE) scale;Able  to understand and use Dyspnea scale;Knowledge and understanding of Target Heart Rate Range (THRR);Understanding of Exercise Prescription Increase Physical Activity;Increase Strength and Stamina;Understanding of Exercise Prescription Increase Physical Activity;Understanding of Exercise Prescription;Increase Strength and Stamina Increase Physical Activity;Able to understand and use rate of perceived exertion (RPE) scale;Knowledge and understanding of Target Heart Rate Range (THRR);Understanding of Exercise Prescription;Increase Strength and Stamina;Able to understand and use Dyspnea scale;Able to check pulse independently Increase Physical Activity;Understanding of Exercise Prescription;Increase Strength and Stamina   Comments Reviewed RPE and dyspnea scale, THR and program prescription with pt today.  Pt voiced understanding and was given a copy of goals to take home. Martyna is off to a good start in the program. She was able to increase her treadmill workload up to a speed of 2.7 mph with no incline. She also did well at level 1 on the arm crank, level 2 on the T5 nustep, and level 3 on the T4 nustep. She also did well with 2 lb hand weights for resistance training. We will continue to monitor her progress in the program. Katrece is doing well in rehab. She increased her treadmill speed to 2. with no incline. We will continue to monitor her  progress in the program. Reviewed home exercise with pt today.  Pt plans to walk, use hand weights and possibly join the well[facility] for exercise.  Reviewed THR, pulse, RPE, sign and symptoms, pulse oximetery and when to call 911 or MD.  Also discussed weather considerations and indoor options.  Pt voiced understanding. Cherrise continues to do well in the program. She continues to do well on the treadmill at a speed of 2.7 mph with no incline and has stayed consistent at level 1 on the T5 nustep. She did increase from 2 lb to 3 lb handweights for resistance training. We will continue to monitor her progress in the program.   Expected Outcomes Short: Use RPE daily to regulate intensity. Long: Follow program prescription in THR. Short: Continue to follow current exercise prescription. Long: Continue exercise to improve strength and stamina. Short: Continue to progressively increase treadmill workload. Long: Continue exercise to improve strength and stamina. Short: join [facility] and add 1-2 days a week of exercie on off days of rehab. Long: maintain independent exercise routine. Short: Begin to progressively increase treadmill workload. Long: Continue exercise to improve strength and stamina.    Row Name 07/02/23 1616 07/17/23 1421 07/23/23 1130         Exercise Goal Re-Evaluation   Exercise Goals Review Increase Physical Activity;Understanding of Exercise Prescription;Increase Strength and Stamina Increase Physical Activity;Understanding of Exercise Prescription;Increase Strength and Stamina Increase Physical Activity;Understanding of Exercise Prescription;Increase Strength and Stamina     Comments Vivika continues to do well in the program. She recently increased her treadmill workload to a speed of 3 mph with no incline. She also improved to level 4 on the T4 nustep as well. We will continue to monitor her progress in the program. Marguerita is doing well in rehab. She recently increased her treadmill speed up to 3.1  mph with no incline. She also improved to level 2 on both the T5 nustep and XR. We will continue to monitor her progress in the program. Joscelyne is doing well here at reahb currently on level 3 on T4. she isnt exercising much at home, besides walking steps indoors throughout the day. She and her husband have been sick recently but would like to start eercsing more outside of rehab. Encouraged her to go to [facility]  gym here at [facility].     Expected Outcomes Short: Continue to progressively increase treadmill workload. Long: Continue exercise to improve strength and stamina. Short: Add incline to treadmill workload. Long: Continue exercise to improve strength and stamina. STG: increase workload and start exercsing outside of rehab. LTG: improve strength and stamina              Discharge Exercise Prescription (Final Exercise Prescription Changes):  Exercise Prescription Changes - 07/17/23 1400       Response to Exercise   Blood Pressure (Admit) 114/62    Blood Pressure (Exit) 104/64    Heart Rate (Admit) 103 bpm    Heart Rate (Exercise) 124 bpm    Heart Rate (Exit) 111 bpm    Oxygen Saturation (Admit) 94 %    Oxygen Saturation (Exercise) 87 %    Oxygen Saturation (Exit) 93 %    Rating of Perceived Exertion (Exercise) 13    Perceived Dyspnea (Exercise) 0    Symptoms none    Duration Continue with 30 min of aerobic exercise without signs/symptoms of physical distress.    Intensity THRR unchanged      Progression   Progression Continue to progress workloads to maintain intensity without signs/symptoms of physical distress.    Average METs 3.23      Resistance Training   Training Prescription Yes    Weight 3 lb    Reps 10-15      Interval Training   Interval Training No      Treadmill   MPH 3.1    Grade 0    Minutes 15    METs 3.37      NuStep   Level 2    Minutes 15    METs 3.6      REL-XR   Level 2    Minutes 15    METs 3.2      T5 Nustep   Level 2    Minutes 15     METs 2.2      Home Exercise Plan   Plans to continue exercise at Home (comment)   walk, hand weights, and possibly join the wellzone   Frequency Add 2 additional days to program exercise sessions.    Initial Home Exercises Provided 06/20/23      Oxygen   Maintain Oxygen Saturation 88% or higher             Nutrition:  Target Goals: Understanding of nutrition guidelines, daily intake of sodium 1500mg , cholesterol 200mg , calories 30% from fat and 7% or less from saturated fats, daily to have 5 or more servings of fruits and vegetables.  Education: All About Nutrition: -Group instruction provided by verbal, written material, interactive activities, discussions, models, and posters to present general guidelines for heart healthy nutrition including fat, fiber, MyPlate, the role of sodium in heart healthy nutrition, utilization of the nutrition label, and utilization of this knowledge for meal planning. Follow up email sent as well. Written material given at graduation.   Biometrics:  Pre Biometrics - 04/25/23 1523       Pre Biometrics   Height 5' (1.524 m)    Weight 149 lb 14.4 oz (68 kg)    Waist Circumference 40 inches    Hip Circumference 42 inches    Waist to Hip Ratio 0.95 %    BMI (Calculated) 29.28    Single Leg Stand 30 seconds              Nutrition Therapy Plan and  Nutrition Goals:  Nutrition Therapy & Goals - 07/11/23 1347       Nutrition Therapy   Diet Mediterranean    Protein (specify units) 75-90    Fiber 25 grams    Whole Grain Foods 3 servings    Saturated Fats 15 max. grams    Fruits and Vegetables 5 servings/day    Sodium 2 grams      Personal Nutrition Goals   Nutrition Goal Eat 15-30gProtein and 30-60gCarbs at each meal.    Personal Goal #2 Include more colorful produce, aim for 5-8 servings of fruits and veggies per day    Personal Goal #3 Read labels and reduce sodium intake to below 2300mg . Ideally 1500mg  per day.    Comments Patient  drinking ~64-80oz of water daily. She typically eats 3 meals per day. Eats out a few times per week. Usually a steakhouse once per week. She wanted to learn how to eat healthier. Reviewed mediterranean diet handout and educated on various nutrition topics like fiber, sodium, cholesterol, and types of fats. Encouraged her to keep animal portions closer to 3oz rather than the [employer] she might have at the steak house. Also reminded her not to over commit to one nutrient and forget about the others. Is a goal she would like to set is to eat both a complex carb and a protein at meals. She enjoys salads and veggies so feels she can do better with including more colorful produce.      Intervention Plan   Intervention Prescribe, educate and counsel regarding individualized specific dietary modifications aiming towards targeted core components such as weight, hypertension, lipid management, diabetes, heart failure and other comorbidities.;Nutrition handout(s) given to patient.    Expected Outcomes Short Term Goal: Understand basic principles of dietary content, such as calories, fat, sodium, cholesterol and nutrients.;Short Term Goal: A plan has been developed with personal nutrition goals set during dietitian appointment.;Long Term Goal: Adherence to prescribed nutrition plan.             Nutrition Assessments:  MEDIFICTS Score Key: >=70 Need to make dietary changes  40-70 Heart Healthy Diet <= 40 Therapeutic Level Cholesterol Diet  Flowsheet Row Pulmonary Rehab from 04/25/2023 in [facility] Cardiac and Pulmonary Rehab  Picture Your Plate Total Score on Admission 38      Picture Your Plate Scores: <81 Unhealthy dietary pattern with much room for improvement. 41-50 Dietary pattern unlikely to meet recommendations for good health and room for improvement. 51-60 More healthful dietary pattern, with some room for improvement.  >60 Healthy dietary pattern, although there may be some specific behaviors  that could be improved.   Nutrition Goals Re-Evaluation:  Nutrition Goals Re-Evaluation     Row Name 06/01/23 1119 07/23/23 1137           Goals   Current Weight 148 lb (67.1 kg) --      Comment Patient was informed on why it is important to maintain a balanced diet when dealing with Respiratory issues. Explained that it takes a lot of energy to breath and when they are short of breath often they will need to have a good diet to help keep up with the calories they are expending for breathing. Taralynn reports she is doing better with including veggies more during the day. Says she is still working on cuting back on salt. Says she prefers salty over sweet. Reviewed some tips to cut back on sodium and how to read labels.  Expected Outcome Short: Choose and plan snacks accordingly to patients caloric intake to improve breathing. Long: Maintain a diet independently that meets their caloric intake to aid in daily shortness of breath. STG: read labels and cut sodiumto below 1500mg . LTG: follow health healthy lifestyle while meeting caloire needs to aid in improving shortness of breath               Nutrition Goals Discharge (Final Nutrition Goals Re-Evaluation):  Nutrition Goals Re-Evaluation - 07/23/23 1137       Goals   Comment Nashea reports she is doing better with including veggies more during the day. Says she is still working on cuting back on salt. Says she prefers salty over sweet. Reviewed some tips to cut back on sodium and how to read labels.    Expected Outcome STG: read labels and cut sodiumto below 1500mg . LTG: follow health healthy lifestyle while meeting caloire needs to aid in improving shortness of breath             Psychosocial: Target Goals: Acknowledge presence or absence of significant depression and/or stress, maximize coping skills, provide positive support system. Participant is able to verbalize types and ability to use techniques and skills needed for  reducing stress and depression.   Education: Stress, Anxiety, and Depression - Group verbal and visual presentation to define topics covered.  Reviews how body is impacted by stress, anxiety, and depression.  Also discusses healthy ways to reduce stress and to treat/manage anxiety and depression.  Written material given at graduation.   Education: Sleep Hygiene -Provides group verbal and written instruction about how sleep can affect your health.  Define sleep hygiene, discuss sleep cycles and impact of sleep habits. Review good sleep hygiene tips.    Initial Review & Psychosocial Screening:  Initial Psych Review & Screening - 04/19/23 1340       Initial Review   Current issues with None Identified      Family Dynamics   Good Support System? Yes      Barriers   Psychosocial barriers to participate in program There are no identifiable barriers or psychosocial needs.      Screening Interventions   Interventions Encouraged to exercise;Provide feedback about the scores to participant;To provide support and resources with identified psychosocial needs    Expected Outcomes Long Term Goal: Stressors or current issues are controlled or eliminated.;Short Term goal: Utilizing psychosocial counselor, staff and physician to assist with identification of specific Stressors or current issues interfering with healing process. Setting desired goal for each stressor or current issue identified.;Short Term goal: Identification and review with participant of any Quality of Life or Depression concerns found by scoring the questionnaire.;Long Term goal: The participant improves quality of Life and PHQ9 Scores as seen by post scores and/or verbalization of changes             Quality of Life Scores:  Scores of 19 and below usually indicate a poorer quality of life in these areas.  A difference of  2-3 points is a clinically meaningful difference.  A difference of 2-3 points in the total score of the  Quality of Life Index has been associated with significant improvement in overall quality of life, self-image, physical symptoms, and general health in studies assessing change in quality of life.  PHQ-9: Review Flowsheet  More data exists      06/01/2023 04/25/2023 02/26/2020 06/27/2019 10/14/2018  Depression screen PHQ 2/9  Decreased Interest 0 0 0 1 1  Down,  Depressed, Hopeless 0 0 0 0 0  PHQ - 2 Score 0 0 0 1 1  Altered sleeping 3 3 - 3 3  Tired, decreased energy 1 2 - 0 1  Change in appetite 0 1 - 1 2  Feeling bad or failure about yourself  0 0 - 1 1  Trouble concentrating 0 0 - 0 0  Moving slowly or fidgety/restless 0 0 - 0 0  Suicidal thoughts 0 0 - 0 0  PHQ-9 Score 4 6 - 6 8  Difficult doing work/chores Not difficult at all Not difficult at all - Not difficult at all Somewhat difficult   Interpretation of Total Score  Total Score Depression Severity:  1-4 = Minimal depression, 5-9 = Mild depression, 10-14 = Moderate depression, 15-19 = Moderately severe depression, 20-27 = Severe depression   Psychosocial Evaluation and Intervention:  Psychosocial Evaluation - 04/19/23 1355       Psychosocial Evaluation & Interventions   Interventions Encouraged to exercise with the program and follow exercise prescription    Comments Tony is coming 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 When asked about her mental health, she states  that currently she feels strong and ready to make progress in her health. It did come has as a shock at first when her health declined rapidly, but she feels at peace with it. [place] and to get away from some of the humidity. She misses walking on the beach, but had a hard time doing so after she got sick. They are originally from this area, so she has support locally. She wants to work on her stamina and shortness of breath, which is worse mainly when walking up the stairs like in  her condo.    Expected Outcomes Short: attend pulmonary rehab for education and exercise. Long: develop and maintain positive self care habits    Continue Psychosocial Services  Follow up required by staff             Psychosocial Re-Evaluation:  Psychosocial Re-Evaluation     Row Name 06/01/23 1121 07/23/23 1133           Psychosocial Re-Evaluation   Current issues with None Identified Current Sleep Concerns      Comments Reviewed patient health questionnaire (PHQ-9) with patient for follow up. Previously, patients score indicated signs/symptoms of depression.  Reviewed to see if patient is improving symptom wise while in program.  Score improved and patient states that it is because she has been able to exercise and move more. Davette reports no stress,anxiety, or depression. She has a good support system and health has improved so she can do ADLs easier. She doesnt get much sleep, usually 5-6hrs. Has a appointment to see a [facility] before summer.      Expected Outcomes Short: Continue to attend [facility] regularly for regular exercise and social engagement. Long: Continue to improve symptoms and manage a positive mental state. STG: Practice good sleep hygiene and continue to exercise. LTG: Continue to improve symptoms and manage a positive mental state      Interventions Encouraged to attend Pulmonary Rehabilitation for the exercise Encouraged to attend Pulmonary Rehabilitation for the exercise      Continue Psychosocial Services  Follow up required by staff Follow up required by staff               Psychosocial Discharge (Final Psychosocial Re-Evaluation):  Psychosocial Re-Evaluation - 07/23/23 1133       Psychosocial Re-Evaluation   Current issues with Current Sleep Concerns    Comments Lashina reports no stress,anxiety, or depression. She has a good support system and health has improved so she can do ADLs easier. She doesnt get much sleep, usually 5-6hrs. Has a  appointment to see a [facility] specialist before summer.    Expected Outcomes STG: Practice good sleep hygiene and continue to exercise. LTG: Continue to improve symptoms and manage a positive mental state    Interventions Encouraged to attend Pulmonary Rehabilitation for the exercise    Continue Psychosocial Services  Follow up required by staff             Education: Education Goals: Education classes will be provided on a weekly basis, covering required topics. Participant will state understanding/return demonstration of topics presented.  Learning Barriers/Preferences:  Learning Barriers/Preferences - 04/19/23 1347       Learning Barriers/Preferences   Learning Barriers None    Learning Preferences None             General Pulmonary Education Topics:  Infection Prevention: - Provides verbal and written material to individual with discussion of infection control including proper hand washing and proper equipment cleaning during exercise session. Flowsheet Row Pulmonary Rehab from 04/25/2023 in [facility] Cardiac and Pulmonary Rehab  Date 04/25/23  Educator MB  Instruction Review Code 1- Verbalizes Understanding       Falls Prevention: - Provides verbal and written material to individual with discussion of falls prevention and safety. Flowsheet Row Pulmonary Rehab from 04/25/2023 in [facility] Cardiac and Pulmonary Rehab  Date 04/25/23  Educator MB  Instruction Review Code 1- Verbalizes Understanding       Chronic Lung Disease Review: - Group verbal instruction with posters, models, PowerPoint presentations and videos,  to review new updates, new respiratory medications, new advancements in procedures and treatments. Providing information on websites and "800" numbers for continued self-education. Includes information about supplement oxygen, available portable oxygen systems, continuous and intermittent flow rates, oxygen safety, concentrators, and Medicare reimbursement for  oxygen. Explanation of Pulmonary Drugs, including class, frequency, complications, importance of spacers, rinsing mouth after steroid MDI's, and proper cleaning methods for nebulizers. Review of basic lung anatomy and physiology related to function, structure, and complications of lung disease. Review of risk factors. Discussion about methods for diagnosing sleep apnea and types of masks and machines for OSA. Includes a review of the use of types of environmental controls: home humidity, furnaces, filters, dust mite/pet prevention, HEPA vacuums. Discussion about weather changes, air quality and the benefits of nasal washing. Instruction on Warning signs, infection symptoms, calling MD promptly, preventive modes, and value of vaccinations. Review of effective airway clearance, coughing and/or vibration techniques. Emphasizing that all should Create an Action Plan. Written material given at graduation. Flowsheet Row Pulmonary Rehab from 04/25/2023 in [facility] Cardiac and Pulmonary Rehab  Education need identified 04/25/23       AED/CPR: - Group verbal and written instruction with the use of models to demonstrate the basic use of the AED with the basic ABC's of resuscitation.    Anatomy and Cardiac Procedures: - Group verbal and visual presentation and models provide information about basic cardiac anatomy and function. Reviews the testing methods done to diagnose heart disease and the outcomes of the test results. Describes the treatment choices: Medical Management, Angioplasty, or Coronary Bypass Surgery for treating various heart conditions including Myocardial Infarction, Angina, Valve Disease, and Cardiac Arrhythmias.  Written material given at graduation.   Medication Safety: - Group verbal and visual instruction to review commonly prescribed medications for heart and lung disease. Reviews the medication, class of the drug, and side effects. Includes the steps to properly store meds and maintain the  prescription regimen.  Written material given at graduation.   Other: -Provides group and verbal instruction on various topics (see comments)   Knowledge Questionnaire Score:  Knowledge Questionnaire Score - 04/25/23 1529       Knowledge Questionnaire Score   Pre Score 16/18              Core Components/Risk Factors/Patient Goals at Admission:  Personal Goals and Risk Factors at Admission - 04/25/23 1529       Core Components/Risk Factors/Patient Goals on Admission    Weight Management Yes;Weight Loss    Intervention Weight Management: Develop a combined nutrition and exercise program designed to reach desired caloric intake, while maintaining appropriate intake of nutrient and fiber, sodium and fats, and appropriate energy expenditure required for the weight goal.;Weight Management: Provide education and appropriate resources to help participant work on and attain dietary goals.;Weight Management/Obesity: Establish reasonable short term and long term weight goals.    Admit Weight 149 lb 14.4 oz (68 kg)    Goal Weight: Short Term 144 lb (65.3 kg)    Goal Weight: Long Term 139 lb (63 kg)    Expected Outcomes Short Term: Continue to assess and modify interventions until short term weight is achieved;Long Term: Adherence to nutrition and physical activity/exercise program aimed toward attainment of established weight goal;Weight Loss: Understanding of general recommendations for a balanced deficit meal plan, which promotes 1-2 lb weight loss per week and includes a negative energy balance of [phone] kcal/d;Understanding recommendations for meals to include 15-35% energy as protein, 25-35% energy from fat, 35-60% energy from carbohydrates, less than 200mg  of dietary cholesterol, 20-35 gm of total fiber daily;Understanding of distribution of calorie intake throughout the day with the consumption of 4-5 meals/snacks    Improve shortness of breath with ADL's Yes    Intervention Provide  education, individualized exercise plan and daily activity instruction to help decrease symptoms of SOB with activities of daily living.    Expected Outcomes Short Term: Improve cardiorespiratory fitness to achieve a reduction of symptoms when performing ADLs;Long Term: Be able to perform more ADLs without symptoms or delay the onset of symptoms    Lipids Yes    Intervention Provide education and support for participant on nutrition & aerobic/resistive exercise along with prescribed medications to achieve LDL 70mg , HDL >40mg .    Expected Outcomes Short Term: Participant states understanding of desired cholesterol values and is compliant with medications prescribed. Participant is following exercise prescription and nutrition guidelines.;Long Term: Cholesterol controlled with medications as prescribed, with individualized exercise RX and with personalized nutrition plan. Value goals: LDL < 70mg , HDL > 40 mg.             Education:Diabetes - Individual verbal and written instruction to review signs/symptoms of diabetes, desired ranges of glucose level fasting, after meals and with exercise. Acknowledge that pre and post exercise glucose checks will be done for 3 sessions at entry of program.   Know Your Numbers and Heart Failure: - Group verbal and visual instruction to discuss disease risk factors for cardiac and pulmonary disease and treatment options.  Reviews associated critical values for Overweight/Obesity, Hypertension, Cholesterol, and Diabetes.  Discusses basics of heart failure: signs/symptoms and treatments.  Introduces Heart Failure  Zone chart for action plan for heart failure.  Written material given at graduation.   Core Components/Risk Factors/Patient Goals Review:   Goals and Risk Factor Review     Row Name 06/01/23 1118 07/23/23 1140           Core Components/Risk Factors/Patient Goals Review   Personal Goals Review Improve shortness of breath with ADL's Improve shortness  of breath with ADL's      Review Spoke to patient about their shortness of breath and what they can do to improve. Patient has been informed of breathing techniques when starting the program. Patient is informed to tell staff if they have had any med changes and that certain meds they are taking or not taking can be causing shortness of breath. Melaysia is doign so much better with breathing since starting program. She can do more ADLs and feels stronger when doing them. She is aware of techniques taught during the program. Reminded her to take her meds and inform staff if any of them change      Expected Outcomes Short: Attend [facility] regularly to improve shortness of breath with ADL's. Long: maintain independence with ADL's STG: Continue to attend [facility] rehab and stay active outside of rehab. LTG: Maintain independence with ADLs               Core Components/Risk Factors/Patient Goals at Discharge (Final Review):   Goals and Risk Factor Review - 07/23/23 1140       Core Components/Risk Factors/Patient Goals Review   Personal Goals Review Improve shortness of breath with ADL's    Review Aerianna is doign so much better with breathing since starting program. She can do more ADLs and feels stronger when doing them. She is aware of techniques taught during the program. Reminded her to take her meds and inform staff if any of them change    Expected Outcomes STG: Continue to attend [facility] rehab and stay active outside of rehab. LTG: Maintain independence with ADLs             ITP Comments:  ITP Comments     Row Name 04/19/23 1345 04/25/23 1515 05/02/23 1114 05/07/23 1117 05/30/23 1133   ITP Comments Initial phone call completed. Diagnosis can be found in [facility] 10[facility]. EP Orientation scheduled for Wednesday 11/13 at 1pm. Completed and gym orientation. Initial ITP created and sent for review to Dr. Jinny Sanders, Medical Director. 30 Day review completed. Medical Director ITP review  done, changes made as directed, and signed approval by Medical Director.    new to program First full day of exercise!  Patient was oriented to gym and equipment including functions, settings, policies, and procedures.  Patient's individual exercise prescription and treatment plan were reviewed.  All starting workloads were established based on the results of the 6 minute walk test done at initial orientation visit.  The plan for exercise progression was also introduced and progression will be customized based on patient's performance and goals. 30 Day review completed. Medical Director ITP review done, changes made as directed, and signed approval by Medical Director.    new to program    Row Name 06/27/23 1458           ITP Comments 30 Day review completed. Medical Director ITP review done, changes made as directed, and signed approval by Medical Director.    new to program                Comments: 30 day review

## 2023-07-30 ENCOUNTER — Encounter: Payer: BC Managed Care – PPO | Admitting: *Deleted

## 2023-07-30 DIAGNOSIS — J8410 Pulmonary fibrosis, unspecified: Secondary | ICD-10-CM | POA: Diagnosis not present

## 2023-07-30 NOTE — Progress Notes (Signed)
 Daily Session Note  Patient Details  Name: Denise Mason MRN: [id number] Date of Birth: 06-Sep-1955 Referring Provider:   Flowsheet Row Pulmonary Rehab from 04/25/2023 in [facility] Cardiac and Pulmonary Rehab  Referring Provider Bethann Punches, MD       Encounter Date: 07/30/2023  Check In:  Session Check In - 07/30/23 1122       Check-In   Supervising physician immediately available to respond to emergencies See telemetry face sheet for immediately available ER MD    Location [facility]-Cardiac & Pulmonary Rehab    Staff Present Cora Collum, RN, BSN, CCRP;Meredith Jewel Baize RN,BSN;Maxon [employer], Exercise Physiologist;Kelly Cloretta Ned, ACSM CEP, Exercise Physiologist    Virtual Visit No    Medication changes reported     No    Fall or balance concerns reported    No    Warm-up and Cool-down Performed on first and last piece of equipment    Resistance Training Performed Yes    VAD Patient? No    PAD/SET Patient? No      Pain Assessment   Currently in Pain? No/denies                Social History   Tobacco Use  Smoking Status Former   Current packs/day: 0.00   Average packs/day: 1 pack/day for 30.0 years (30.0 ttl pk-yrs)   Types: Cigarettes   Start date: 03/05/1983   Quit date: 06/12/2012   Years since quitting: 11.1  Smokeless Tobacco Never  Tobacco Comments   1 ppd for 30 years     Goals Met:  Proper associated with RPD/PD & O2 Sat Independence with exercise equipment Exercise tolerated well No report of concerns or symptoms today  Goals Unmet:  Not Applicable  Comments: Pt able to follow exercise prescription today without complaint.  Will continue to monitor for progression.    Dr. Bethann Punches is Medical Director for [facility] Cardiac Rehabilitation.  Dr. Vida Rigger is Medical Director for [facility] Pulmonary Rehabilitation.

## 2023-08-01 ENCOUNTER — Encounter: Payer: BC Managed Care – PPO | Admitting: *Deleted

## 2023-08-01 DIAGNOSIS — J8410 Pulmonary fibrosis, unspecified: Secondary | ICD-10-CM | POA: Diagnosis not present

## 2023-08-01 NOTE — Progress Notes (Signed)
 Daily Session Note  Patient Details  Name: Denise Mason MRN: [id number] Date of Birth: 1956/04/24 Referring Provider:   Flowsheet Row Pulmonary Rehab from 04/25/2023 in [facility] Cardiac and Pulmonary Rehab  Referring Provider Bethann Punches, MD       Encounter Date: 08/01/2023  Check In:  Session Check In - 08/01/23 1120       Check-In   Supervising physician immediately available to respond to emergencies See telemetry face sheet for immediately available ER MD    Location [facility]-Cardiac & Pulmonary Rehab    Staff Present Cora Collum, RN, BSN, CCRP;Maxon Conetta BS, Exercise Physiologist;Margaret Best, MS, Exercise Physiologist;Joseph Reino Kent RCP,RRT,BSRT    Virtual Visit No    Medication changes reported     No    Fall or balance concerns reported    No    Warm-up and Cool-down Performed on first and last piece of equipment    Resistance Training Performed Yes    VAD Patient? No    PAD/SET Patient? No      Pain Assessment   Currently in Pain? No/denies                Social History   Tobacco Use  Smoking Status Former   Current packs/day: 0.00   Average packs/day: 1 pack/day for 30.0 years (30.0 ttl pk-yrs)   Types: Cigarettes   Start date: 03/05/1983   Quit date: 06/12/2012   Years since quitting: 11.1  Smokeless Tobacco Never  Tobacco Comments   1 ppd for 30 years     Goals Met:  Proper associated with RPD/PD & O2 Sat Independence with exercise equipment Exercise tolerated well No report of concerns or symptoms today  Goals Unmet:  Not Applicable  Comments: Pt able to follow exercise prescription today without complaint.  Will continue to monitor for progression.    Dr. Bethann Punches is Medical Director for [facility] Cardiac Rehabilitation.  Dr. Vida Rigger is Medical Director for [facility] Pulmonary Rehabilitation.

## 2023-08-03 ENCOUNTER — Encounter: Payer: BC Managed Care – PPO | Admitting: *Deleted

## 2023-08-03 DIAGNOSIS — J8410 Pulmonary fibrosis, unspecified: Secondary | ICD-10-CM | POA: Diagnosis not present

## 2023-08-03 NOTE — Progress Notes (Signed)
 Daily Session Note  Patient Details  Name: CARISMA TROUPE MRN: [id number] Date of Birth: 1955/09/27 Referring Provider:   Flowsheet Row Pulmonary Rehab from 04/25/2023 in [facility] Cardiac and Pulmonary Rehab  Referring Provider Bethann Punches, MD       Encounter Date: 08/03/2023  Check In:  Session Check In - 08/03/23 1110       Check-In   Supervising physician immediately available to respond to emergencies See telemetry face sheet for immediately available ER MD    Location [facility]-Cardiac & Pulmonary Rehab    Staff Present Cora Collum, RN, BSN, CCRP;Joseph Hood RCP,RRT,BSRT;Noah Tickle, [place], Exercise Physiologist    Virtual Visit No    Medication changes reported     No    Fall or balance concerns reported    No    Warm-up and Cool-down Performed on first and last piece of equipment    Resistance Training Performed Yes    VAD Patient? No    PAD/SET Patient? No      Pain Assessment   Currently in Pain? No/denies                Social History   Tobacco Use  Smoking Status Former   Current packs/day: 0.00   Average packs/day: 1 pack/day for 30.0 years (30.0 ttl pk-yrs)   Types: Cigarettes   Start date: 03/05/1983   Quit date: 06/12/2012   Years since quitting: 11.1  Smokeless Tobacco Never  Tobacco Comments   1 ppd for 30 years     Goals Met:  Proper associated with RPD/PD & O2 Sat Independence with exercise equipment Exercise tolerated well No report of concerns or symptoms today  Goals Unmet:  Not Applicable  Comments: Pt able to follow exercise prescription today without complaint.  Will continue to monitor for progression.    Dr. Bethann Punches is Medical Director for [facility] Cardiac Rehabilitation.  Dr. Vida Rigger is Medical Director for [facility] Pulmonary Rehabilitation.

## 2023-08-06 ENCOUNTER — Encounter: Payer: BC Managed Care – PPO | Admitting: *Deleted

## 2023-08-06 DIAGNOSIS — J8410 Pulmonary fibrosis, unspecified: Secondary | ICD-10-CM | POA: Diagnosis not present

## 2023-08-06 NOTE — Progress Notes (Signed)
 Daily Session Note  Patient Details  Name: Denise Mason MRN: [id number] Date of Birth: 01/18/1956 Referring Provider:   Flowsheet Row Pulmonary Rehab from 04/25/2023 in [facility] Cardiac and Pulmonary Rehab  Referring Provider Bethann Punches, MD       Encounter Date: 08/06/2023  Check In:  Session Check In - 08/06/23 1117       Check-In   Supervising physician immediately available to respond to emergencies See telemetry face sheet for immediately available ER MD    Location [facility]-Cardiac & Pulmonary Rehab    Staff Present Rory Percy, MS, Exercise Physiologist;Maxon Suzzette Righter, Exercise Physiologist;Kelly Cloretta Ned, ACSM CEP, Exercise Physiologist;Vernee Baines, RN, BSN, CCRP    Virtual Visit No    Medication changes reported     No    Fall or balance concerns reported    No    Warm-up and Cool-down Performed on first and last piece of equipment    Resistance Training Performed Yes    VAD Patient? No    PAD/SET Patient? No      Pain Assessment   Currently in Pain? No/denies                Social History   Tobacco Use  Smoking Status Former   Current packs/day: 0.00   Average packs/day: 1 pack/day for 30.0 years (30.0 ttl pk-yrs)   Types: Cigarettes   Start date: 03/05/1983   Quit date: 06/12/2012   Years since quitting: 11.1  Smokeless Tobacco Never  Tobacco Comments   1 ppd for 30 years     Goals Met:  Proper associated with RPD/PD & O2 Sat Independence with exercise equipment Exercise tolerated well No report of concerns or symptoms today  Goals Unmet:  Not Applicable  Comments: Pt able to follow exercise prescription today without complaint.  Will continue to monitor for progression.    Dr. Bethann Punches is Medical Director for [facility] Cardiac Rehabilitation.  Dr. Vida Rigger is Medical Director for [facility] Pulmonary Rehabilitation.

## 2023-08-08 ENCOUNTER — Encounter: Payer: BC Managed Care – PPO | Admitting: *Deleted

## 2023-08-08 DIAGNOSIS — J8410 Pulmonary fibrosis, unspecified: Secondary | ICD-10-CM | POA: Diagnosis not present

## 2023-08-08 NOTE — Progress Notes (Signed)
 Daily Session Note  Patient Details  Name: Denise Mason MRN: [id number] Date of Birth: 04/12/1956 Referring Provider:   Flowsheet Row Pulmonary Rehab from 04/25/2023 in [facility] Cardiac and Pulmonary Rehab  Referring Provider Bethann Punches, MD       Encounter Date: 08/08/2023  Check In:  Session Check In - 08/08/23 1130       Check-In   Supervising physician immediately available to respond to emergencies See telemetry face sheet for immediately available ER MD    Location [facility]-Cardiac & Pulmonary Rehab    Staff Present Susann Givens RN,BSN;Joseph [facility] Best, MS, Exercise Physiologist    Virtual Visit No    Medication changes reported     No    Fall or balance concerns reported    No    Warm-up and Cool-down Performed on first and last piece of equipment    Resistance Training Performed Yes    VAD Patient? No    PAD/SET Patient? No      Pain Assessment   Currently in Pain? No/denies                Social History   Tobacco Use  Smoking Status Former   Current packs/day: 0.00   Average packs/day: 1 pack/day for 30.0 years (30.0 ttl pk-yrs)   Types: Cigarettes   Start date: 03/05/1983   Quit date: 06/12/2012   Years since quitting: 11.1  Smokeless Tobacco Never  Tobacco Comments   1 ppd for 30 years     Goals Met:  Independence with exercise equipment Exercise tolerated well No report of concerns or symptoms today Strength training completed today  Goals Unmet:  Not Applicable  Comments: Pt able to follow exercise prescription today without complaint.  Will continue to monitor for progression.    Dr. Bethann Punches is Medical Director for [facility] Cardiac Rehabilitation.  Dr. Vida Rigger is Medical Director for [facility] Pulmonary Rehabilitation.

## 2023-08-13 ENCOUNTER — Encounter: Payer: BC Managed Care – PPO | Attending: Internal Medicine | Admitting: *Deleted

## 2023-08-13 DIAGNOSIS — J8410 Pulmonary fibrosis, unspecified: Secondary | ICD-10-CM | POA: Insufficient documentation

## 2023-08-13 NOTE — Progress Notes (Signed)
 Daily Session Note  Patient Details  Name: Denise Mason MRN: [id number] Date of Birth: 06/20/1955 Referring Provider:   Flowsheet Row Pulmonary Rehab from 04/25/2023 in [facility] Cardiac and Pulmonary Rehab  Referring Provider Bethann Punches, MD       Encounter Date: 08/13/2023  Check In:  Session Check In - 08/13/23 1128       Check-In   Supervising physician immediately available to respond to emergencies See telemetry face sheet for immediately available ER MD    Location [facility]-Cardiac & Pulmonary Rehab    Staff Present Susann Givens RN,BSN;Joseph [facility] RCP,RRT,BSRT;Margaret Best, MS, Exercise Physiologist;Kelly Cloretta Ned, ACSM CEP, Exercise Physiologist    Virtual Visit No    Medication changes reported     No    Fall or balance concerns reported    No    Warm-up and Cool-down Performed on first and last piece of equipment    Resistance Training Performed Yes    VAD Patient? No    PAD/SET Patient? No      Pain Assessment   Currently in Pain? No/denies                Social History   Tobacco Use  Smoking Status Former   Current packs/day: 0.00   Average packs/day: 1 pack/day for 30.0 years (30.0 ttl pk-yrs)   Types: Cigarettes   Start date: 03/05/1983   Quit date: 06/12/2012   Years since quitting: 11.1  Smokeless Tobacco Never  Tobacco Comments   1 ppd for 30 years     Goals Met:  Independence with exercise equipment Exercise tolerated well No report of concerns or symptoms today Strength training completed today  Goals Unmet:  Not Applicable  Comments: Pt able to follow exercise prescription today without complaint.  Will continue to monitor for progression.    Dr. Bethann Punches is Medical Director for [facility] Cardiac Rehabilitation.  Dr. Vida Rigger is Medical Director for [facility] Pulmonary Rehabilitation.

## 2023-08-16 ENCOUNTER — Ambulatory Visit
Admission: RE | Admit: 2023-08-16 | Discharge: 2023-08-16 | Disposition: A | Discharge status: Home / Self Care | Payer: BC Managed Care – PPO | Source: Ambulatory Visit | Attending: Internal Medicine | Admitting: Internal Medicine

## 2023-08-16 DIAGNOSIS — Z1231 Encounter for screening mammogram for malignant neoplasm of breast: Secondary | ICD-10-CM | POA: Diagnosis present

## 2023-08-17 ENCOUNTER — Ambulatory Visit
Admission: RE | Admit: 2023-08-17 | Discharge: 2023-08-17 | Disposition: A | Discharge status: Home / Self Care | Payer: Self-pay | Source: Ambulatory Visit | Attending: Internal Medicine | Admitting: Internal Medicine

## 2023-08-17 ENCOUNTER — Other Ambulatory Visit: Payer: Self-pay | Admitting: *Deleted

## 2023-08-17 DIAGNOSIS — J8410 Pulmonary fibrosis, unspecified: Secondary | ICD-10-CM

## 2023-08-17 DIAGNOSIS — Z1231 Encounter for screening mammogram for malignant neoplasm of breast: Secondary | ICD-10-CM

## 2023-08-17 NOTE — Progress Notes (Signed)
 Daily Session Note  Patient Details  Name: ISRAA CABAN MRN: [id number] Date of Birth: 1955-07-25 Referring Provider:   Flowsheet Row Pulmonary Rehab from 04/25/2023 in [facility] Cardiac and Pulmonary Rehab  Referring Provider Bethann Punches, MD       Encounter Date: 08/17/2023  Check In:  Session Check In - 08/17/23 1052       Check-In   Supervising physician immediately available to respond to emergencies See telemetry face sheet for immediately available ER MD    Location [facility]-Cardiac & Pulmonary Rehab    Staff Present Kelton Pillar RN,BSN,MPA;Noah Tickle, BS, Exercise Physiologist;Joseph Reino Kent RCP,RRT,BSRT    Virtual Visit No    Medication changes reported     No    Fall or balance concerns reported    No    Warm-up and Cool-down Performed on first and last piece of equipment    Resistance Training Performed Yes    VAD Patient? No    PAD/SET Patient? No      Pain Assessment   Currently in Pain? No/denies                Social History   Tobacco Use  Smoking Status Former   Current packs/day: 0.00   Average packs/day: 1 pack/day for 30.0 years (30.0 ttl pk-yrs)   Types: Cigarettes   Start date: 03/05/1983   Quit date: 06/12/2012   Years since quitting: 11.1  Smokeless Tobacco Never  Tobacco Comments   1 ppd for 30 years     Goals Met:  Independence with exercise equipment Exercise tolerated well No report of concerns or symptoms today Strength training completed today  Goals Unmet:  Not Applicable  Comments: Pt able to follow exercise prescription today without complaint.  Will continue to monitor for progression.    Dr. Bethann Punches is Medical Director for [facility] Cardiac Rehabilitation.  Dr. Vida Rigger is Medical Director for [facility] Pulmonary Rehabilitation.

## 2023-08-20 ENCOUNTER — Encounter: Payer: BC Managed Care – PPO | Admitting: *Deleted

## 2023-08-20 DIAGNOSIS — J8410 Pulmonary fibrosis, unspecified: Secondary | ICD-10-CM | POA: Diagnosis not present

## 2023-08-20 NOTE — Progress Notes (Signed)
 Daily Session Note  Patient Details  Name: Denise Mason MRN: [id number] Date of Birth: 09-Dec-1955 Referring Provider:   Flowsheet Row Pulmonary Rehab from 04/25/2023 in [facility] Cardiac and Pulmonary Rehab  Referring Provider Bethann Punches, MD       Encounter Date: 08/20/2023  Check In:  Session Check In - 08/20/23 1121       Check-In   Supervising physician immediately available to respond to emergencies See telemetry face sheet for immediately available ER MD    Location [facility]-Cardiac & Pulmonary Rehab    Staff Present Cora Collum, RN, BSN, CCRP;Margaret Best, MS, Exercise Physiologist;Kelly Cloretta Ned, ACSM CEP, Exercise Physiologist;Meredith Jewel Baize RN,BSN    Virtual Visit No    Medication changes reported     No    Fall or balance concerns reported    No    Warm-up and Cool-down Performed on first and last piece of equipment    Resistance Training Performed Yes    VAD Patient? No    PAD/SET Patient? No      Pain Assessment   Currently in Pain? No/denies                Social History   Tobacco Use  Smoking Status Former   Current packs/day: 0.00   Average packs/day: 1 pack/day for 30.0 years (30.0 ttl pk-yrs)   Types: Cigarettes   Start date: 03/05/1983   Quit date: 06/12/2012   Years since quitting: 11.1  Smokeless Tobacco Never  Tobacco Comments   1 ppd for 30 years     Goals Met:  Proper associated with RPD/PD & O2 Sat Independence with exercise equipment Exercise tolerated well No report of concerns or symptoms today  Goals Unmet:  Not Applicable  Comments: Pt able to follow exercise prescription today without complaint.  Will continue to monitor for progression.    Dr. Bethann Punches is Medical Director for [facility] Cardiac Rehabilitation.  Dr. Vida Rigger is Medical Director for [facility] Pulmonary Rehabilitation.

## 2023-08-22 ENCOUNTER — Encounter: Payer: BC Managed Care – PPO | Admitting: *Deleted

## 2023-08-22 VITALS — Ht 60.0 in | Wt 145.4 lb

## 2023-08-22 DIAGNOSIS — J8410 Pulmonary fibrosis, unspecified: Secondary | ICD-10-CM | POA: Diagnosis not present

## 2023-08-22 NOTE — Patient Instructions (Signed)
 Discharge Patient Instructions  Patient Details  Name: Denise Mason MRN: [id number] Date of Birth: [birth year] Referring Provider:  Danella Penton, MD   Number of Visits: 36  Reason for Discharge:  Patient reached a stable level of exercise. Patient independent in their exercise. Patient has met program and personal goals.  Diagnosis:  Pulmonary fibrosis (HCC)  Initial Exercise Prescription:  Initial Exercise Prescription - 04/25/23 1500       Date of Initial Exercise RX and Referring Provider   Date 04/25/23    Referring Provider Bethann Punches, MD      Oxygen   Maintain Oxygen Saturation 88% or higher      Treadmill   MPH 2.3    Grade 0    Minutes 15    METs 2.76      Elliptical   Level 1   Try Elliptical   Speed 3    Minutes 15    METs 2.81      REL-XR   Level 2    Speed 50    Minutes 15    METs 2.81      T5 Nustep   Level 2    SPM 80    Minutes 15    METs 2.81      Track   Laps 33    Minutes 15    METs 2.79      Prescription Details   Frequency (times per week) 3    Duration Progress to 30 minutes of continuous aerobic without signs/symptoms of physical distress      Intensity   THRR 40-80% of Max Heartrate 114-141    Ratings of Perceived Exertion 11-13    Perceived Dyspnea 0-4      Progression   Progression Continue to progress workloads to maintain intensity without signs/symptoms of physical distress.      Resistance Training   Training Prescription Yes    Weight 2 lb    Reps 10-15             Discharge Exercise Prescription (Final Exercise Prescription Changes):  Exercise Prescription Changes - 08/15/23 0700       Response to Exercise   Blood Pressure (Admit) 110/60    Blood Pressure (Exit) 106/64    Heart Rate (Admit) 87 bpm    Heart Rate (Exercise) 109 bpm    Heart Rate (Exit) 101 bpm    Oxygen Saturation (Admit) 96 %    Oxygen Saturation (Exercise) 89 %    Oxygen Saturation (Exit) 94 %    Rating of Perceived  Exertion (Exercise) 13    Perceived Dyspnea (Exercise) 1    Symptoms none    Duration Continue with 30 min of aerobic exercise without signs/symptoms of physical distress.    Intensity THRR unchanged      Progression   Progression Continue to progress workloads to maintain intensity without signs/symptoms of physical distress.    Average METs 3.2      Resistance Training   Training Prescription Yes    Weight 3 lb    Reps 10-15      Interval Training   Interval Training No      Treadmill   MPH 3    Grade 0    Minutes 15    METs 3.3      NuStep   Level 3    Minutes 15    METs 3.5      REL-XR   Level 2    Minutes 15  METs 3.5      T5 Nustep   Level 2    Minutes 15    METs 2.3      Biostep-RELP   Level 3    Minutes 15      Home Exercise Plan   Plans to continue exercise at Home (comment)   walk, hand weights, and possibly join the wellzone   Frequency Add 2 additional days to program exercise sessions.    Initial Home Exercises Provided 06/20/23      Oxygen   Maintain Oxygen Saturation 88% or higher             Functional Capacity:  6 Minute Walk     Row Name 04/25/23 1516 08/22/23 1125       6 Minute Walk   Phase Initial Discharge    Distance 1225 feet 1400 feet    Distance % Change -- 14.3 %    Distance Feet Change -- 175 ft    Walk Time 6 minutes 6 minutes    # of Rest Breaks 0 0    MPH 2.3 2.65    METS 2.81 3.21    RPE 11 11    Perceived Dyspnea  1 1    VO2 Peak 9.83 11.24    Symptoms No No    Resting HR 88 bpm 106 bpm    Resting BP 110/70 100/56    Resting Oxygen Saturation  96 % 94 %    Exercise Oxygen Saturation  during 6 min walk 87 % 85 %    Max Ex. HR 116 bpm 125 bpm    Max Ex. BP 112/76 116/60    2 Minute Post BP 108/72 104/58      Interval HR   1 Minute HR 110 115    2 Minute HR 112 119    3 Minute HR 112 122    4 Minute HR 113 122    5 Minute HR 116 123    6 Minute HR 114 125    2 Minute Post HR 95 108    Interval  Heart Rate? Yes Yes      Interval Oxygen   Interval Oxygen? Yes Yes    Baseline Oxygen Saturation % 96 % 94 %    1 Minute Oxygen Saturation % 93 % 90 %    1 Minute Liters of Oxygen 0 L 0 L  RA    2 Minute Oxygen Saturation % 87 % 87 %    2 Minute Liters of Oxygen 0 L 0 L    3 Minute Oxygen Saturation % 87 % 85 %    3 Minute Liters of Oxygen 0 L 0 L    4 Minute Oxygen Saturation % 87 % 85 %    4 Minute Liters of Oxygen 0 L 0 L    5 Minute Oxygen Saturation % 88 % 86 %    5 Minute Liters of Oxygen 0 L 0 L    6 Minute Oxygen Saturation % 88 % 86 %    6 Minute Liters of Oxygen 0 L 0 L    2 Minute Post Oxygen Saturation % 95 % 97 %    2 Minute Post Liters of Oxygen 0 L 0 L             Nutrition & Weight - Outcomes:  Pre Biometrics - 04/25/23 1523       Pre Biometrics   Height 5' (1.524 m)  Weight 149 lb 14.4 oz (68 kg)    Waist Circumference 40 inches    Hip Circumference 42 inches    Waist to Hip Ratio 0.95 %    BMI (Calculated) 29.28    Single Leg Stand 30 seconds             Post Biometrics - 08/22/23 1131        Post  Biometrics   Height 5' (1.524 m)    Weight 145 lb 6.4 oz (66 kg)    Waist Circumference 37 inches    Hip Circumference 40 inches    Waist to Hip Ratio 0.93 %    BMI (Calculated) 28.4    Single Leg Stand 30 seconds             Nutrition:  Nutrition Therapy & Goals - 07/11/23 1347       Nutrition Therapy   Diet Mediterranean    Protein (specify units) 75-90    Fiber 25 grams    Whole Grain Foods 3 servings    Saturated Fats 15 max. grams    Fruits and Vegetables 5 servings/day    Sodium 2 grams      Personal Nutrition Goals   Nutrition Goal Eat 15-30gProtein and 30-60gCarbs at each meal.    Personal Goal #2 Include more colorful produce, aim for 5-8 servings of fruits and veggies per day    Personal Goal #3 Read labels and reduce sodium intake to below 2300mg . Ideally 1500mg  per day.    Comments Patient drinking ~64-80oz of  water daily. She typically eats 3 meals per day. Eats out a few times per week. Usually a steakhouse once per week. She wanted to learn how to eat healthier. Reviewed mediterranean diet handout and educated on various nutrition topics like fiber, sodium, cholesterol, and types of fats. Encouraged her to keep animal portions closer to 3oz rather than the [employer] she might have at the steak house. Also reminded her not to over commit to one nutrient and forget about the others. Is a goal she would like to set is to eat both a complex carb and a protein at meals. She enjoys salads and veggies so feels she can do better with including more colorful produce.      Intervention Plan   Intervention Prescribe, educate and counsel regarding individualized specific dietary modifications aiming towards targeted core components such as weight, hypertension, lipid management, diabetes, heart failure and other comorbidities.;Nutrition handout(s) given to patient.    Expected Outcomes Short Term Goal: Understand basic principles of dietary content, such as calories, fat, sodium, cholesterol and nutrients.;Short Term Goal: A plan has been developed with personal nutrition goals set during dietitian appointment.;Long Term Goal: Adherence to prescribed nutrition plan.

## 2023-08-22 NOTE — Progress Notes (Signed)
 Daily Session Note  Patient Details  Name: AMORITA VANROSSUM MRN: [id number] Date of Birth: 02/22/1956 Referring Provider:   Flowsheet Row Pulmonary Rehab from 04/25/2023 in [facility]  Referring Provider Bethann Punches, MD       Encounter Date: 08/22/2023  Check In:  Session Check In - 08/22/23 1118       Check-In   Supervising physician immediately available to respond to emergencies See telemetry face sheet for immediately available ER MD    Location [facility]-Cardiac & Pulmonary Rehab    Staff Present Cora Collum, RN, BSN, CCRP;Maxon Conetta BS, Exercise Physiologist;Joseph Hood RCP,RRT,BSRT;Noah Tickle, BS, Exercise Physiologist    Virtual Visit No    Medication changes reported     No    Fall or balance concerns reported    No    Warm-up and Cool-down Performed on first and last piece of equipment    Resistance Training Performed Yes    VAD Patient? No    PAD/SET Patient? No      Pain Assessment   Currently in Pain? No/denies                Social History   Tobacco Use  Smoking Status Former   Current packs/day: 0.00   Average packs/day: 1 pack/day for 30.0 years (30.0 ttl pk-yrs)   Types: Cigarettes   Start date: 03/05/1983   Quit date: 06/12/2012   Years since quitting: 11.2  Smokeless Tobacco Never  Tobacco Comments   1 ppd for 30 years     Goals Met:  Proper associated with RPD/PD & O2 Sat Independence with exercise equipment Exercise tolerated well No report of concerns or symptoms today  Goals Unmet:  Not Applicable  Comments: Pt able to follow exercise prescription today without complaint.  Will continue to monitor for progression.    Dr. Bethann Punches is Medical Director for [facility] Card[facility].  Dr. Vida Rigger is Medical Director for [facility] Pulmonary Rehabilitation.

## 2023-08-22 NOTE — Progress Notes (Signed)
 Pulmonary Individual Treatment Plan  Patient Details  Name: TATUM CORL MRN: [id number] Date of Birth: 09-30-1955 Referring Provider:   Flowsheet Row Pulmonary Rehab from 04/25/2023 in [facility] Cardiac and Pulmonary Rehab  Referring Provider Bethann Punches, MD       Initial Encounter Date:  Flowsheet Row Pulmonary Rehab from 04/25/2023 in [facility] Cardiac and Pulmonary Rehab  Date 04/25/23       Visit Diagnosis: Pulmonary fibrosis (HCC)  Patient's Home Medications on Admission:  Current Outpatient Medications:    albuterol (ACCUNEB) 1.25 MG/3ML nebulizer solution, Take 1 ampule by nebulization every 6 (six) hours as needed., Disp: , Rfl:    albuterol (VENTOLIN HFA) 108 (90 Base) MCG/ACT inhaler, Inhale into the lungs., Disp: , Rfl:    alendronate (FOSAMAX) 70 MG tablet, Take 1 tablet (70 mg total) by mouth every 7 (seven) days. Take with a full glass of water on an empty stomach., Disp: 12 tablet, Rfl: 3   ALPRAZolam (XANAX) 0.5 MG tablet, Take 1 tablet (0.5 mg total) by mouth 2 (two) times daily as needed. for anxiety (Patient not taking: Reported on 04/19/2023), Disp: 10 tablet, Rfl: 0   ammonium lactate (LAC-HYDRIN) 12 % lotion, APPLY TOPICALLY AS NEEDED FOR DRY SKIN (Patient not taking: Reported on 04/19/2023), Disp: 400 g, Rfl: 0   ARMOUR THYROID 30 MG tablet, TAKE 1 TABLET BY MOUTH EVERY DAY EXCEPT TAKE 1 1/2 TABLETS ON SUNDAY (Patient not taking: Reported on 04/19/2023), Disp: 90 tablet, Rfl: 2   aspirin EC 81 MG tablet, Take 1 tablet (81 mg total) by mouth daily. (Patient not taking: Reported on 04/19/2023), Disp: 30 tablet, Rfl: 5   Coenzyme Q10 (COQ-10) 100 MG CAPS, Take 1 capsule by mouth 3 (three) times a week. (Patient not taking: Reported on 04/19/2023), Disp: 100 capsule, Rfl: 0   DULoxetine (CYMBALTA) 60 MG capsule, Take 1 capsule (60 mg total) by mouth daily. (Patient not taking: Reported on 04/19/2023), Disp: 90 capsule, Rfl: 1   ezetimibe (ZETIA) 10 MG tablet, Take 1 tablet (10  mg total) by mouth daily. For cholesterol (Patient not taking: Reported on 04/19/2023), Disp: 90 tablet, Rfl: 1   Fluticasone-Umeclidin-Vilant (TRELEGY ELLIPTA) 100-62.5-25 MCG/ACT AEPB, Inhale into the lungs., Disp: , Rfl:    gabapentin (NEURONTIN) 300 MG capsule, Take 1 capsule (300 mg total) by mouth at bedtime. (Patient not taking: Reported on 04/19/2023), Disp: 90 capsule, Rfl: 1   hydrOXYzine (ATARAX/VISTARIL) 10 MG tablet, Take 1 tablet (10 mg total) by mouth 3 (three) times daily as needed. (Patient not taking: Reported on 04/19/2023), Disp: 30 tablet, Rfl: 0   Krill Oil Omega-3 500 MG CAPS, Take 1 capsule by mouth daily. (Patient not taking: Reported on 04/19/2023), Disp: 30 capsule, Rfl: 0   levothyroxine (SYNTHROID) 50 MCG tablet, Take by mouth., Disp: , Rfl:    nitroGLYCERIN (NITROSTAT) 0.4 MG SL tablet, Place 1 tablet (0.4 mg total) under the tongue every 5 (five) minutes as needed for chest pain. (Patient not taking: Reported on 04/19/2023), Disp: 20 tablet, Rfl: 2   PREMARIN vaginal cream, INSERT 1 APPLICATORFUL VAGINALLY 2 TIMES A WEEK (Patient not taking: Reported on 04/19/2023), Disp: 30 g, Rfl: 10   rosuvastatin (CRESTOR) 10 MG tablet, Take 1 tablet (10 mg total) by mouth 3 (three) times a week. TAKE 1 TABLET(10 MG) BY MOUTH DAILY, Disp: 36 tablet, Rfl: 1   valACYclovir (VALTREX) 1000 MG tablet, Take 1 tablet (1,000 mg total) by mouth 2 (two) times daily. (Patient not taking: Reported on  02/26/2020), Disp: 10 tablet, Rfl: 0  Past Medical History: Past Medical History:  Diagnosis Date   ADD (attention deficit disorder with hyperactivity)    CAD (coronary artery disease)    mild, non-obstructive. by cath 02/16/09 (luminal irreg mid LAD and mid RCA)    Cancer (HCC)    basil   Depression    HLD (hyperlipidemia)    Hypothyroidism    Osteopenia     Tobacco Use: Social History   Tobacco Use  Smoking Status Former   Current packs/day: 0.00   Average packs/day: 1 pack/day for 30.0  years (30.0 ttl pk-yrs)   Types: Cigarettes   Start date: 03/05/1983   Quit date: 06/12/2012   Years since quitting: 11.2  Smokeless Tobacco Never  Tobacco Comments   1 ppd for 30 years     Labs: Review Flowsheet  More data exists      Latest Ref Rng & Units 03/08/2015 03/31/2016 11/21/2016 11/27/2017 06/27/2019  Labs for ITP Cardiac and Pulmonary Rehab  Cholestrol <200 mg/dL 829  562  130  865  784   LDL (calc) mg/dL (calc) 696  295  284  132  156   HDL-C > OR = 50 mg/dL 48  50  52  53  43   Trlycerides <150 mg/dL 440  102  725  366  440   Hemoglobin A1c <5.7 % of total Hgb - 5.6  - 5.5  5.8      Pulmonary Assessment Scores:  Pulmonary Assessment Scores     Row Name 04/25/23 1523         ADL UCSD   ADL Phase Entry     SOB Score total 48     Rest 1     Walk 2     Stairs 5     Bath 0     Dress 0     Shop 2       CAT Score   CAT Score 17       mMRC Score   mMRC Score 1              UCSD: Self-administered rating of dyspnea associated with activities of daily living (ADLs) 6-point scale (0 = "not at all" to 5 = "maximal or unable to do because of breathlessness")  Scoring Scores range from 0 to 120.  Minimally important difference is 5 units  CAT: CAT can identify the health impairment of COPD patients and is better correlated with disease progression.  CAT has a scoring range of zero to 40. The CAT score is classified into four groups of low (less than 10), medium (10 - 20), high (21-30) and very high (31-40) based on the impact level of disease on health status. A CAT score over 10 suggests significant symptoms.  A worsening CAT score could be explained by an exacerbation, poor medication adherence, poor inhaler technique, or progression of COPD or comorbid conditions.  CAT MCID is 2 points  mMRC: mMRC (Modified Medical Research Council) Dyspnea Scale is used to assess the degree of baseline functional disability in patients of respiratory disease due to  dyspnea. No minimal important difference is established. A decrease in score of 1 point or greater is considered a positive change.   Pulmonary Function Assessment:   Exercise Target Goals: Exercise Program Goal: Individual exercise prescription set using results from initial 6 min walk test and THRR while considering  patient's activity barriers and safety.   Exercise Prescription Goal: Initial exercise  prescription builds to 30-45 minutes a day of aerobic activity, 2-3 days per week.  Home exercise guidelines will be given to patient during program as part of exercise prescription that the participant will acknowledge.  Education: Aerobic Exercise: - Group verbal and visual presentation on the components of exercise prescription. Introduces F.I.T.T principle from ACSM for exercise prescriptions.  Reviews F.I.T.T. principles of aerobic exercise including progression. Written material given at graduation.   Education: Resistance Exercise: - Group verbal and visual presentation on the components of exercise prescription. Introduces F.I.T.T principle from ACSM for exercise prescriptions  Reviews F.I.T.T. principles of resistance exercise including progression. Written material given at graduation.    Education: Exercise & Equipment Safety: - Individual verbal instruction and demonstration of equipment use and safety with use of the equipment. Flowsheet Row Pulmonary Rehab from 04/25/2023 in [facility] Cardiac and Pulmonary Rehab  Date 04/25/23  Educator MB  Instruction Review Code 1- Verbalizes Understanding       Education: Exercise Physiology & General Exercise Guidelines: - Group verbal and written instruction with models to review the exercise physiology of the cardiovascular system and associated critical values. Provides general exercise guidelines with specific guidelines to those with heart or lung disease.    Education: Flexibility, Balance, Mind/Body Relaxation: - Group verbal  and visual presentation with interactive activity on the components of exercise prescription. Introduces F.I.T.T principle from ACSM for exercise prescriptions. Reviews F.I.T.T. principles of flexibility and balance exercise training including progression. Also discusses the mind body connection.  Reviews various relaxation techniques to help reduce and manage stress (i.e. Deep breathing, progressive muscle relaxation, and visualization). Balance handout provided to take home. Written material given at graduation.   Activity Barriers & Risk Stratification:  Activity Barriers & Cardiac Risk Stratification - 04/25/23 1518       Activity Barriers & Cardiac Risk Stratification   Activity Barriers Joint Problems;Shortness of Breath;Other (comment)    Comments Osteoporosis             6 Minute Walk:  6 Minute Walk     Row Name 04/25/23 1516         6 Minute Walk   Phase Initial     Distance 1225 feet     Walk Time 6 minutes     # of Rest Breaks 0     MPH 2.3     METS 2.81     RPE 11     Perceived Dyspnea  1     VO2 Peak 9.83     Symptoms No     Resting HR 88 bpm     Resting BP 110/70     Resting Oxygen Saturation  96 %     Exercise Oxygen Saturation  during 6 min walk 87 %     Max Ex. HR 116 bpm     Max Ex. BP 112/76     2 Minute Post BP 108/72       Interval HR   1 Minute HR 110     2 Minute HR 112     3 Minute HR 112     4 Minute HR 113     5 Minute HR 116     6 Minute HR 114     2 Minute Post HR 95     Interval Heart Rate? Yes       Interval Oxygen   Interval Oxygen? Yes     Baseline Oxygen Saturation % 96 %     1  Minute Oxygen Saturation % 93 %     1 Minute Liters of Oxygen 0 L     2 Minute Oxygen Saturation % 87 %     2 Minute Liters of Oxygen 0 L     3 Minute Oxygen Saturation % 87 %     3 Minute Liters of Oxygen 0 L     4 Minute Oxygen Saturation % 87 %     4 Minute Liters of Oxygen 0 L     5 Minute Oxygen Saturation % 88 %     5 Minute Liters of  Oxygen 0 L     6 Minute Oxygen Saturation % 88 %     6 Minute Liters of Oxygen 0 L     2 Minute Post Oxygen Saturation % 95 %     2 Minute Post Liters of Oxygen 0 L             Oxygen Initial Assessment:  Oxygen Initial Assessment - 04/19/23 1335       Home Oxygen   Home Oxygen Device None    Sleep Oxygen Prescription None    Home Exercise Oxygen Prescription None    Home Resting Oxygen Prescription None    Compliance with Home Oxygen Use Yes      Intervention   Short Term Goals To learn and understand importance of monitoring SPO2 with pulse oximeter and demonstrate accurate use of the pulse oximeter.;To learn and understand importance of maintaining oxygen saturations>88%;To learn and demonstrate proper pursed lip breathing techniques or other breathing techniques. ;To learn and demonstrate proper use of respiratory medications    Long  Term Goals Verbalizes importance of monitoring SPO2 with pulse oximeter and return demonstration;Maintenance of O2 saturations>88%;Exhibits proper breathing techniques, such as pursed lip breathing or other method taught during program session;Compliance with respiratory medication;Demonstrates proper use of MDI's             Oxygen Re-Evaluation:  Oxygen Re-Evaluation     Row Name 05/07/23 1118 06/01/23 1117 08/03/23 1105         Program Oxygen Prescription   Program Oxygen Prescription -- None None       Home Oxygen   Home Oxygen Device -- None None     Sleep Oxygen Prescription -- None None     Home Exercise Oxygen Prescription -- None None     Home Resting Oxygen Prescription -- None None       Goals/Expected Outcomes   Short Term Goals -- To learn and understand importance of maintaining oxygen saturations>88%;To learn and understand importance of monitoring SPO2 with pulse oximeter and demonstrate accurate use of the pulse oximeter. To learn and demonstrate proper pursed lip breathing techniques or other breathing techniques.       Long  Term Goals -- Maintenance of O2 saturations>88%;Verbalizes importance of monitoring SPO2 with pulse oximeter and return demonstration Exhibits proper breathing techniques, such as pursed lip breathing or other method taught during program session     Comments Reviewed PLB technique with pt.  Talked about how it works and it's importance in maintaining their exercise saturations. She has a pulse oximeter to check her oxygen saturation at home. Informed and explained why it is important to have one. Reviewed that oxygen saturations should be 88 percent and above. Patient verbalizes understanding. Informed patient how to perform the Pursed Lipped breathing technique. Told patient to Inhale through the nose and out the mouth with pursed lips to keep their airways  open, help oxygenate them better, practice when at rest or doing strenuous activity. Patient Verbalizes understanding of technique and will work on and be reiterated during [facility].     Goals/Expected Outcomes Short: Become more profiecient at using PLB. Long: Become independent at using PLB. Short: monitor oxygen at home with exertion. Long: maintain oxygen saturations above 88 percent independently. Short: use PLB with exertion. Long: use PLB on exertion proficiently and independently.              Oxygen Discharge (Final Oxygen Re-Evaluation):  Oxygen Re-Evaluation - 08/03/23 1105       Program Oxygen Prescription   Program Oxygen Prescription None      Home Oxygen   Home Oxygen Device None    Sleep Oxygen Prescription None    Home Exercise Oxygen Prescription None    Home Resting Oxygen Prescription None      Goals/Expected Outcomes   Short Term Goals To learn and demonstrate proper pursed lip breathing techniques or other breathing techniques.     Long  Term Goals Exhibits proper breathing techniques, such as pursed lip breathing or other method taught during program session    Comments Informed patient how to perform  the Pursed Lipped breathing technique. Told patient to Inhale through the nose and out the mouth with pursed lips to keep their airways open, help oxygenate them better, practice when at rest or doing strenuous activity. Patient Verbalizes understanding of technique and will work on and be reiterated during [facility].    Goals/Expected Outcomes Short: use PLB with exertion. Long: use PLB on exertion proficiently and independently.             Initial Exercise Prescription:  Initial Exercise Prescription - 04/25/23 1500       Date of Initial Exercise RX and Referring Provider   Date 04/25/23    Referring Provider Bethann Punches, MD      Oxygen   Maintain Oxygen Saturation 88% or higher      Treadmill   MPH 2.3    Grade 0    Minutes 15    METs 2.76      Elliptical   Level 1   Try Elliptical   Speed 3    Minutes 15    METs 2.81      REL-XR   Level 2    Speed 50    Minutes 15    METs 2.81      T5 Nustep   Level 2    SPM 80    Minutes 15    METs 2.81      Track   Laps 33    Minutes 15    METs 2.79      Prescription Details   Frequency (times per week) 3    Duration Progress to 30 minutes of continuous aerobic without signs/symptoms of physical distress      Intensity   THRR 40-80% of Max Heartrate 114-141    Ratings of Perceived Exertion 11-13    Perceived Dyspnea 0-4      Progression   Progression Continue to progress workloads to maintain intensity without signs/symptoms of physical distress.      Resistance Training   Training Prescription Yes    Weight 2 lb    Reps 10-15             Perform Capillary Blood Glucose checks as needed.  Exercise Prescription Changes:   Exercise Prescription Changes     Row Name 04/25/23 1500  05/23/23 1400 06/05/23 0900 06/20/23 1200 06/21/23 1600     Response to Exercise   Blood Pressure (Admit) 110/70 102/64 112/62 -- 112/64   Blood Pressure (Exercise) 112/76 122/60 134/74 -- --   Blood Pressure (Exit)  108/72 100/60 110/64 -- 104/60   Heart Rate (Admit) 88 bpm 95 bpm 89 bpm -- 95 bpm   Heart Rate (Exercise) 116 bpm 125 bpm 120 bpm -- 116 bpm   Heart Rate (Exit) 95 bpm 105 bpm 102 bpm -- 107 bpm   Oxygen Saturation (Admit) 96 % 93 % 95 % -- 94 %   Oxygen Saturation (Exercise) 87 % 88 % 88 % -- 89 %   Oxygen Saturation (Exit) 95 % 94 % 94 % -- 94 %   Rating of Perceived Exertion (Exercise) 11 15 13  -- 13   Perceived Dyspnea (Exercise) 1 1 0 -- 0   Symptoms none none none -- none   Comments results First three days of exercise -- -- --   Duration Progress to 30 minutes of  aerobic without signs/symptoms of physical distress Continue with 30 min of aerobic exercise without signs/symptoms of physical distress. Continue with 30 min of aerobic exercise without signs/symptoms of physical distress. Continue with 30 min of aerobic exercise without signs/symptoms of physical distress. Continue with 30 min of aerobic exercise without signs/symptoms of physical distress.   Intensity THRR New THRR unchanged THRR unchanged THRR unchanged THRR unchanged     Progression   Progression Continue to progress workloads to maintain intensity without signs/symptoms of physical distress. Continue to progress workloads to maintain intensity without signs/symptoms of physical distress. Continue to progress workloads to maintain intensity without signs/symptoms of physical distress. Continue to progress workloads to maintain intensity without signs/symptoms of physical distress. Continue to progress workloads to maintain intensity without signs/symptoms of physical distress.   Average METs 2.81 2.45 2.56 2.56 2.57     Resistance Training   Training Prescription -- Yes Yes Yes Yes   Weight -- 2 lb 2 lb 2 lb 3 lb   Reps -- 10-15 10-15 10-15 10-15     Interval Training   Interval Training -- No No No No     Oxygen   Oxygen -- Continuous -- -- --     Treadmill   MPH -- 2.7 2.8 2.8 2.7   Grade -- 0 0 0 0    Minutes -- 15 15 15 15    METs -- 3.07 3.14 3.14 3.07     NuStep   Level -- 3 1 1  --   Minutes -- 15 15 15  --   METs -- 2.9 2.2 2.2 --     Arm Ergometer   Level -- 1 -- -- --   Minutes -- 15 -- -- --   METs -- 1 -- -- --     T5 Nustep   Level -- 2 1 1 1    Minutes -- 15 15 15 15    METs -- 2 2.2 2.2 2.2     Biostep-RELP   Level -- -- 1 1 --   SPM -- -- 50 50 --   Minutes -- -- 15 15 --   METs -- -- 2 2 --     Home Exercise Plan   Plans to continue exercise at -- -- -- Home (comment)  walk, hand weights, and possibly join the [facility] Home (comment)  walk, hand weights, and possibly join the [facility]   Frequency -- -- -- Add 2  additional days to program exercise sessions. Add 2 additional days to program exercise sessions.   Initial Home Exercises Provided -- -- -- 06/20/23 06/20/23     Oxygen   Maintain Oxygen Saturation -- 88% or higher 88% or higher 88% or higher 88% or higher    Row Name 07/02/23 1600 07/17/23 1400 08/01/23 1000 08/15/23 0700       Response to Exercise   Blood Pressure (Admit) 110/60 114/62 96/68 110/60    Blood Pressure (Exit) 102/60 104/64 110/64 106/64    Heart Rate (Admit) 91 bpm 103 bpm 93 bpm 87 bpm    Heart Rate (Exercise) 118 bpm 124 bpm 120 bpm 109 bpm    Heart Rate (Exit) 103 bpm 111 bpm 106 bpm 101 bpm    Oxygen Saturation (Admit) 94 % 94 % 96 % 96 %    Oxygen Saturation (Exercise) 89 % 87 % 90 % 89 %    Oxygen Saturation (Exit) 93 % 93 % 96 % 94 %    Rating of Perceived Exertion (Exercise) 12 13 12 13     Perceived Dyspnea (Exercise) 0 0 1 1    Symptoms none none none none    Duration Continue with 30 min of aerobic exercise without signs/symptoms of physical distress. Continue with 30 min of aerobic exercise without signs/symptoms of physical distress. Continue with 30 min of aerobic exercise without signs/symptoms of physical distress. Continue with 30 min of aerobic exercise without signs/symptoms of physical distress.    Intensity THRR  unchanged THRR unchanged THRR unchanged THRR unchanged      Progression   Progression Continue to progress workloads to maintain intensity without signs/symptoms of physical distress. Continue to progress workloads to maintain intensity without signs/symptoms of physical distress. Continue to progress workloads to maintain intensity without signs/symptoms of physical distress. Continue to progress workloads to maintain intensity without signs/symptoms of physical distress.    Average METs 3.09 3.23 3.22 3.2      Resistance Training   Training Prescription Yes Yes Yes Yes    Weight 3 lb 3 lb 3 lb 3 lb    Reps 10-15 10-15 10-15 10-15      Interval Training   Interval Training No No No No      Treadmill   MPH 3 3.1 2.8 3    Grade 0 0 0 0    Minutes 15 15 15 15     METs 3.3 3.37 3.14 3.3      NuStep   Level 4 2 3 3     Minutes 15 15 15 15     METs 3.1 3.6 3.1 3.5      REL-XR   Level 1 2 1 2     Minutes 15 15 15 15     METs 3.4 3.2 3.5 3.5      T5 Nustep   Level 1 2 -- 2    Minutes 15 15 -- 15    METs 2.2 2.2 -- 2.3      Biostep-RELP   Level -- -- -- 3    Minutes -- -- -- 15      Home Exercise Plan   Plans to continue exercise at Home (comment)  walk, hand weights, and possibly join the [facility] Home (comment)  walk, hand weights, and possibly join the [facility] Home (comment)  walk, hand weights, and possibly join the [facility]zone Home (comment)  walk, hand weights, and possibly join the wellzone    Frequency Add 2 additional days to program exercise  sessions. Add 2 additional days to program exercise sessions. Add 2 additional days to program exercise sessions. Add 2 additional days to program exercise sessions.    Initial Home Exercises Provided 06/20/23 06/20/23 06/20/23 06/20/23      Oxygen   Maintain Oxygen Saturation 88% or higher 88% or higher 88% or higher 88% or higher             Exercise Comments:   Exercise Comments     Row Name 05/07/23 1117            Exercise Comments First full day of exercise!  Patient was oriented to gym and equipment including functions, settings, policies, and procedures.  Patient's individual exercise prescription and treatment plan were reviewed.  All starting workloads were established based on the results of the 6 minute walk test done at initial orientation visit.  The plan for exercise progression was also introduced and progression will be customized based on patient's performance and goals.                Exercise Goals and Review:   Exercise Goals     Row Name 04/25/23 1522             Exercise Goals   Increase Physical Activity Yes       Intervention Provide advice, education, support and counseling about physical activity/exercise needs.;Develop an individualized exercise prescription for aerobic and resistive training based on initial evaluation findings, risk stratification, comorbidities and participant's personal goals.       Expected Outcomes Short Term: Attend rehab on a regular basis to increase amount of physical activity.;Long Term: Add in home exercise to make exercise part of routine and to increase amount of physical activity.;Long Term: Exercising regularly at least 3-5 days a week.       Increase Strength and Stamina Yes       Intervention Provide advice, education, support and counseling about physical activity/exercise needs.;Develop an individualized exercise prescription for aerobic and resistive training based on initial evaluation findings, risk stratification, comorbidities and participant's personal goals.       Expected Outcomes Short Term: Increase workloads from initial exercise prescription for resistance, speed, and METs.;Short Term: Perform resistance training exercises routinely during rehab and add in resistance training at home;Long Term: Improve cardiorespiratory fitness, muscular endurance and strength as measured by increased METs and functional capacity ( )       Able  to understand and use rate of perceived exertion (RPE) scale Yes       Intervention Provide education and explanation on how to use RPE scale       Expected Outcomes Short Term: Able to use RPE daily in rehab to express subjective intensity level;Long Term:  Able to use RPE to guide intensity level when exercising independently       Able to understand and use Dyspnea scale Yes       Intervention Provide education and explanation on how to use Dyspnea scale       Expected Outcomes Short Term: Able to use Dyspnea scale daily in rehab to express subjective sense of shortness of breath during exertion;Long Term: Able to use Dyspnea scale to guide intensity level when exercising independently       Knowledge and understanding of Target Heart Rate Range (THRR) Yes       Intervention Provide education and explanation of THRR including how the numbers were predicted and where they are located for reference       Expected  Outcomes Short Term: Able to state/look up THRR;Short Term: Able to use daily as guideline for intensity in rehab;Long Term: Able to use THRR to govern intensity when exercising independently       Able to check pulse independently Yes       Intervention Provide education and demonstration on how to check pulse in carotid and radial arteries.;Review the importance of being able to check your own pulse for safety during independent exercise       Expected Outcomes Short Term: Able to explain why pulse checking is important during independent exercise;Long Term: Able to check pulse independently and accurately       Understanding of Exercise Prescription Yes       Intervention Provide education, explanation, and written materials on patient's individual exercise prescription       Expected Outcomes Short Term: Able to explain program exercise prescription;Long Term: Able to explain home exercise prescription to exercise independently                Exercise Goals Re-Evaluation :   Exercise Goals Re-Evaluation     Row Name 05/07/23 1117 05/23/23 1424 06/05/23 0943 06/20/23 1205 06/21/23 1604     Exercise Goal Re-Evaluation   Exercise Goals Review Able to understand and use rate of perceived exertion (RPE) scale;Able to understand and use Dyspnea scale;Knowledge and understanding of Target Heart Rate Range (THRR);Understanding of Exercise Prescription Increase Physical Activity;Increase Strength and Stamina;Understanding of Exercise Prescription Increase Physical Activity;Understanding of Exercise Prescription;Increase Strength and Stamina Increase Physical Activity;Able to understand and use rate of perceived exertion (RPE) scale;Knowledge and understanding of Target Heart Rate Range (THRR);Understanding of Exercise Prescription;Increase Strength and Stamina;Able to understand and use Dyspnea scale;Able to check pulse independently Increase Physical Activity;Understanding of Exercise Prescription;Increase Strength and Stamina   Comments Reviewed RPE and dyspnea scale, THR and program prescription with pt today.  Pt voiced understanding and was given a copy of goals to take home. Candiace is off to a good start in the program. She was able to increase her treadmill workload up to a speed of 2.7 mph with no incline. She also did well at level 1 on the arm crank, level 2 on the T5 nustep, and level 3 on the T4 nustep. She also did well with 2 lb hand weights for resistance training. We will continue to monitor her progress in the program. Chastidy is doing well in rehab. She increased her treadmill speed to 2. with no incline. We will continue to monitor her progress in the program. Reviewed home exercise with pt today.  Pt plans to walk, use hand weights and possibly join the wellzone for exercise.  Reviewed THR, pulse, RPE, sign and symptoms, pulse oximetery and when to call 911 or MD.  Also discussed weather considerations and indoor options.  Pt voiced understanding. Recie continues to do  well in the program. She continues to do well on the treadmill at a speed of 2.7 mph with no incline and has stayed consistent at level 1 on the T5 nustep. She did increase from 2 lb to 3 lb handweights for resistance training. We will continue to monitor her progress in the program.   Expected Outcomes Short: Use RPE daily to regulate intensity. Long: Follow program prescription in THR. Short: Continue to follow current exercise prescription. Long: Continue exercise to improve strength and stamina. Short: Continue to progressively increase treadmill workload. Long: Continue exercise to improve strength and stamina. Short: join wellzone and add 1-2 days a  week of exercie on off days of rehab. Long: maintain independent exercise routine. Short: Begin to progressively increase treadmill workload. Long: Continue exercise to improve strength and stamina.    Row Name 07/02/23 1616 07/17/23 1421 07/23/23 1130 08/01/23 1037 08/15/23 0747     Exercise Goal Re-Evaluation   Exercise Goals Review Increase Physical Activity;Understanding of Exercise Prescription;Increase Strength and Stamina Increase Physical Activity;Understanding of Exercise Prescription;Increase Strength and Stamina Increase Physical Activity;Understanding of Exercise Prescription;Increase Strength and Stamina Increase Physical Activity;Increase Strength and Stamina;Understanding of Exercise Prescription Increase Physical Activity;Increase Strength and Stamina;Understanding of Exercise Prescription   Comments Mitali continues to do well in the program. She recently increased her treadmill workload to a speed of 3 mph with no incline. She also improved to level 4 on the T4 nustep as well. We will continue to monitor her progress in the program. Jovana is doing well in rehab. She recently increased her treadmill speed up to 3.1 mph with no incline. She also improved to level 2 on both the T5 nustep and XR. We will continue to monitor her progress in the  program. Via is doing well here at reahb currently on level 3 on T4. she isnt exercising much at home, besides walking steps indoors throughout the day. She and her husband have been sick recently but would like to start eercsing more outside of rehab. Encouraged her to go to [employer] here at [facility]. Caytlin continues to do well in rehab. She has seen a decrease in her treadmill speed down to 2.8 mph with no incline. She also has decreased down to level 1 on the XR and continues to work at level 3 on the T4 nustep. We will continue to monitor her progress in the program. Sarabella is doing well in rehab. She recently increased her treadmill speed back up to 3 mph with no incline. She also improved to level 3 on the biostep. She is due for her post and will look to improve on it. We will continue to monitor her progress in the program.   Expected Outcomes Short: Continue to progressively increase treadmill workload. Long: Continue exercise to improve strength and stamina. Short: Add incline to treadmill workload. Long: Continue exercise to improve strength and stamina. STG: increase workload and start exercsing outside of rehab. LTG: improve strength and stamina Short: Increase treadmill workload back up to previous level. Long: Continue exercise to improve strength and stamina. Short: Improve on post . Long: Continue exercise to improve strength and stamina.            Discharge Exercise Prescription (Final Exercise Prescription Changes):  Exercise Prescription Changes - 08/15/23 0700       Response to Exercise   Blood Pressure (Admit) 110/60    Blood Pressure (Exit) 106/64    Heart Rate (Admit) 87 bpm    Heart Rate (Exercise) 109 bpm    Heart Rate (Exit) 101 bpm    Oxygen Saturation (Admit) 96 %    Oxygen Saturation (Exercise) 89 %    Oxygen Saturation (Exit) 94 %    Rating of Perceived Exertion (Exercise) 13    Perceived Dyspnea (Exercise) 1    Symptoms none    Duration Continue  with 30 min of aerobic exercise without signs/symptoms of physical distress.    Intensity THRR unchanged      Progression   Progression Continue to progress workloads to maintain intensity without signs/symptoms of physical distress.    Average METs 3.2  Resistance Training   Training Prescription Yes    Weight 3 lb    Reps 10-15      Interval Training   Interval Training No      Treadmill   MPH 3    Grade 0    Minutes 15    METs 3.3      NuStep   Level 3    Minutes 15    METs 3.5      REL-XR   Level 2    Minutes 15    METs 3.5      T5 Nustep   Level 2    Minutes 15    METs 2.3      Biostep-RELP   Level 3    Minutes 15      Home Exercise Plan   Plans to continue exercise at Home (comment)   walk, hand weights, and possibly join the wellzone   Frequency Add 2 additional days to program exercise sessions.    Initial Home Exercises Provided 06/20/23      Oxygen   Maintain Oxygen Saturation 88% or higher             Nutrition:  Target Goals: Understanding of nutrition guidelines, daily intake of sodium 1500mg , cholesterol 200mg , calories 30% from fat and 7% or less from saturated fats, daily to have 5 or more servings of fruits and vegetables.  Education: All About Nutrition: -Group instruction provided by verbal, written material, interactive activities, discussions, models, and posters to present general guidelines for heart healthy nutrition including fat, fiber, MyPlate, the role of sodium in heart healthy nutrition, utilization of the nutrition label, and utilization of this knowledge for meal planning. Follow up email sent as well. Written material given at graduation.   Biometrics:  Pre Biometrics - 04/25/23 1523       Pre Biometrics   Height 5' (1.524 m)    Weight 149 lb 14.4 oz (68 kg)    Waist Circumference 40 inches    Hip Circumference 42 inches    Waist to Hip Ratio 0.95 %    BMI (Calculated) 29.28    Single Leg Stand 30 seconds               Nutrition Therapy Plan and Nutrition Goals:  Nutrition Therapy & Goals - 07/11/23 1347       Nutrition Therapy   Diet Mediterranean    Protein (specify units) 75-90    Fiber 25 grams    Whole Grain Foods 3 servings    Saturated Fats 15 max. grams    Fruits and Vegetables 5 servings/day    Sodium 2 grams      Personal Nutrition Goals   Nutrition Goal Eat 15-30gProtein and 30-60gCarbs at each meal.    Personal Goal #2 Include more colorful produce, aim for 5-8 servings of fruits and veggies per day    Personal Goal #3 Read labels and reduce sodium intake to below 2300mg . Ideally 1500mg  per day.    Comments Patient drinking ~64-80oz of water daily. She typically eats 3 meals per day. Eats out a few times per week. Usually a steakhouse once per week. She wanted to learn how to eat healthier. Reviewed mediterranean diet handout and educated on various nutrition topics like fiber, sodium, cholesterol, and types of fats. Encouraged her to keep animal portions closer to 3oz rather than the [employer] she might have at the steak house. Also reminded her not to over commit to one nutrient  and forget about the others. Is a goal she would like to set is to eat both a complex carb and a protein at meals. She enjoys salads and veggies so feels she can do better with including more colorful produce.      Intervention Plan   Intervention Prescribe, educate and counsel regarding individualized specific dietary modifications aiming towards targeted core components such as weight, hypertension, lipid management, diabetes, heart failure and other comorbidities.;Nutrition handout(s) given to patient.    Expected Outcomes Short Term Goal: Understand basic principles of dietary content, such as calories, fat, sodium, cholesterol and nutrients.;Short Term Goal: A plan has been developed with personal nutrition goals set during dietitian appointment.;Long Term Goal: Adherence to prescribed nutrition  plan.             Nutrition Assessments:  MEDIFICTS Score Key: >=70 Need to make dietary changes  40-70 Heart Healthy Diet <= 40 Therapeutic Level Cholesterol Diet  Flowsheet Row Pulmonary Rehab from 04/25/2023 in [facility]  Picture Your Plate Total Score on Admission 38      Picture Your Plate Scores: <16 Unhealthy dietary pattern with much room for improvement. 41-50 Dietary pattern unlikely to meet recommendations for good health and room for improvement. 51-60 More healthful dietary pattern, with some room for improvement.  >60 Healthy dietary pattern, although there may be some specific behaviors that could be improved.   Nutrition Goals Re-Evaluation:  Nutrition Goals Re-Evaluation     Row Name 06/01/23 1119 07/23/23 1137 08/03/23 1108         Goals   Current Weight 148 lb (67.1 kg) -- 147 lb (66.7 kg)     Nutrition Goal -- -- Eat more vegetables and fruit.     Comment Patient was informed on why it is important to maintain a balanced diet when dealing with Respiratory issues. Explained that it takes a lot of energy to breath and when they are short of breath often they will need to have a good diet to help keep up with the calories they are expending for breathing. Makinzey reports she is doing better with including veggies more during the day. Says she is still working on cuting back on salt. Says she prefers salty over sweet. Reviewed some tips to cut back on sodium and how to read labels. Fama states she has been more concious of her salt intake. She still trying to intake more vegetables and fruits. She is watching nutrition labels to make sure she does not intake so much sodium.     Expected Outcome Short: Choose and plan snacks accordingly to patients caloric intake to improve breathing. Long: Maintain a diet independently that meets their caloric intake to aid in daily shortness of breath. STG: read labels and cut sodiumto below 1500mg . LTG:  follow health healthy lifestyle while meeting caloire needs to aid in improving shortness of breath Short: continue to reduce sodium intake and eat more fruits and veggies. Long: adhere to a diet that works for her needs.              Nutrition Goals Discharge (Final Nutrition Goals Re-Evaluation):  Nutrition Goals Re-Evaluation - 08/03/23 1108       Goals   Current Weight 147 lb (66.7 kg)    Nutrition Goal Eat more vegetables and fruit.    Comment Verdelle states she has been more concious of her salt intake. She still trying to intake more vegetables and fruits. She is watching nutrition labels to  make sure she does not intake so much sodium.    Expected Outcome Short: continue to reduce sodium intake and eat more fruits and veggies. Long: adhere to a diet that works for her needs.             Psychosocial: Target Goals: Acknowledge presence or absence of significant depression and/or stress, maximize coping skills, provide positive support system. Participant is able to verbalize types and ability to use techniques and skills needed for reducing stress and depression.   Education: Stress, Anxiety, and Depression - Group verbal and visual presentation to define topics covered.  Reviews how body is impacted by stress, anxiety, and depression.  Also discusses healthy ways to reduce stress and to treat/manage anxiety and depression.  Written material given at graduation.   Education: Sleep Hygiene -Provides group verbal and written instruction about how sleep can affect your health.  Define sleep hygiene, discuss sleep cycles and impact of sleep habits. Review good sleep hygiene tips.    Initial Review & Psychosocial Screening:  Initial Psych Review & Screening - 04/19/23 1340       Initial Review   Current issues with None Identified      Family Dynamics   Good Support System? Yes      Barriers   Psychosocial barriers to participate in program There are no identifiable  barriers or psychosocial needs.      Screening Interventions   Interventions Encouraged to exercise;Provide feedback about the scores to participant;To provide support and resources with identified psychosocial needs    Expected Outcomes Long Term Goal: Stressors or current issues are controlled or eliminated.;Short Term goal: Utilizing psychosocial counselor, staff and physician to assist with identification of specific Stressors or current issues interfering with healing process. Setting desired goal for each stressor or current issue identified.;Short Term goal: Identification and review with participant of any Quality of Life or Depression concerns found by scoring the questionnaire.;Long Term goal: The participant improves quality of Life and PHQ9 Scores as seen by post scores and/or verbalization of changes             Quality of Life Scores:  Scores of 19 and below usually indicate a poorer quality of life in these areas.  A difference of  2-3 points is a clinically meaningful difference.  A difference of 2-3 points in the total score of the Quality of Life Index has been associated with significant improvement in overall quality of life, self-image, physical symptoms, and general health in studies assessing change in quality of life.  PHQ-9: Review Flowsheet  More data exists      06/01/2023 04/25/2023 02/26/2020 06/27/2019 10/14/2018  Depression screen PHQ 2/9  Decreased Interest 0 0 0 1 1  Down, Depressed, Hopeless 0 0 0 0 0  PHQ - 2 Score 0 0 0 1 1  Altered sleeping 3 3 - 3 3  Tired, decreased energy 1 2 - 0 1  Change in appetite 0 1 - 1 2  Feeling bad or failure about yourself  0 0 - 1 1  Trouble concentrating 0 0 - 0 0  Moving slowly or fidgety/restless 0 0 - 0 0  Suicidal thoughts 0 0 - 0 0  PHQ-9 Score 4 6 - 6 8  Difficult doing work/chores Not difficult at all Not difficult at all - Not difficult at all Somewhat difficult   Interpretation of Total Score  Total Score  Depression Severity:  1-4 = Minimal depression, 5-9 = Mild depression,  10-14 = Moderate depression, 15-19 = Moderately severe depression, 20-27 = Severe depression   Psychosocial Evaluation and Intervention:  Psychosocial Evaluation - 04/19/23 1355       Psychosocial Evaluation & Interventions   Interventions Encouraged to exercise with the program and follow exercise prescription    Comments Anetha is coming 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 When asked about her mental health, she states  that currently she feels strong and ready to make progress in her health. It did come has as a shock at first when her health declined rapidly, but she feels at peace with it. [place] and to get away from some of the humidity. She misses walking on the beach, but had a hard time doing so after she got sick. They are originally from this area, so she has support locally. She wants to work on her stamina and shortness of breath, which is worse mainly when walking up the stairs like in her condo.    Expected Outcomes Short: attend pulmonary rehab for education and exercise. Long: develop and maintain positive self care habits    Continue Psychosocial Services  Follow up required by staff             Psychosocial Re-Evaluation:  Psychosocial Re-Evaluation     Row Name 06/01/23 1121 07/23/23 1133 08/03/23 1111         Psychosocial Re-Evaluation   Current issues with None Identified Current Sleep Concerns None Identified     Comments Reviewed patient health questionnaire (PHQ-9) with patient for follow up. Previously, patients score indicated signs/symptoms of depression.  Reviewed to see if patient is improving symptom wise while in program.  Score improved and patient states that it is because she has been able to exercise and move more. Arie reports no stress,anxiety, or depression. She has a good support system and  health has improved so she can do ADLs easier. She doesnt get much sleep, usually 5-6hrs. Has a appointment to see a [facility] specialist before summer. Patient reports no issues with their current mental states, sleep, stress, depression or anxiety. Will follow up with patient in a few weeks for any changes.     Expected Outcomes Short: Continue to attend [facility] regularly for regular exercise and social engagement. Long: Continue to improve symptoms and manage a positive mental state. STG: Practice good sleep hygiene and continue to exercise. LTG: Continue to improve symptoms and manage a positive mental state Short: Continue to exercise regularly to support mental health and notify staff of any changes. Long: maintain mental health and well being through teaching of rehab or prescribed medications independently.     Interventions Encouraged to attend Pulmonary Rehabilitation for the exercise Encouraged to attend Pulmonary Rehabilitation for the exercise Encouraged to attend Pulmonary Rehabilitation for the exercise     Continue Psychosocial Services  Follow up required by staff Follow up required by staff Follow up required by staff              Psychosocial Discharge (Final Psychosocial Re-Evaluation):  Psychosocial Re-Evaluation - 08/03/23 1111       Psychosocial Re-Evaluation   Current issues with None Identified    Comments Patient reports no issues with their current mental states, sleep, stress, depression or anxiety. Will follow up with patient in a few weeks for any changes.    Expected Outcomes Short: Continue to exercise regularly to support mental  health and notify staff of any changes. Long: maintain mental health and well being through teaching of rehab or prescribed medications independently.    Interventions Encouraged to attend Pulmonary Rehabilitation for the exercise    Continue Psychosocial Services  Follow up required by staff             Education: Education Goals:  Education classes will be provided on a weekly basis, covering required topics. Participant will state understanding/return demonstration of topics presented.  Learning Barriers/Preferences:  Learning Barriers/Preferences - 04/19/23 1347       Learning Barriers/Preferences   Learning Barriers None    Learning Preferences None             General Pulmonary Education Topics:  Infection Prevention: - Provides verbal and written material to individual with discussion of infection control including proper hand washing and proper equipment cleaning during exercise session. Flowsheet Row Pulmonary Rehab from 04/25/2023 in [facility] Cardiac and Pulmonary Rehab  Date 04/25/23  Educator MB  Instruction Review Code 1- Verbalizes Understanding       Falls Prevention: - Provides verbal and written material to individual with discussion of falls prevention and safety. Flowsheet Row Pulmonary Rehab from 04/25/2023 in [facility] Card[facility] and Pulmonary Rehab  Date 04/25/23  Educator MB  Instruction Review Code 1- Verbalizes Understanding       Chronic Lung Disease Review: - Group verbal instruction with posters, models, PowerPoint presentations and videos,  to review new updates, new respiratory medications, new advancements in procedures and treatments. Providing information on websites and "800" numbers for continued self-education. Includes information about supplement oxygen, available portable oxygen systems, continuous and intermittent flow rates, oxygen safety, concentrators, and Medicare reimbursement for oxygen. Explanation of Pulmonary Drugs, including class, frequency, complications, importance of spacers, rinsing mouth after steroid MDI's, and proper cleaning methods for nebulizers. Review of basic lung anatomy and physiology related to function, structure, and complications of lung disease. Review of risk factors. Discussion about methods for diagnosing sleep apnea and types of masks and  machines for OSA. Includes a review of the use of types of environmental controls: home humidity, furnaces, filters, dust mite/pet prevention, HEPA vacuums. Discussion about weather changes, air quality and the benefits of nasal washing. Instruction on Warning signs, infection symptoms, calling MD promptly, preventive modes, and value of vaccinations. Review of effective airway clearance, coughing and/or vibration techniques. Emphasizing that all should Create an Action Plan. Written material given at graduation. Flowsheet Row Pulmonary Rehab from 04/25/2023 in [facility] Card[facility] and Pulmonary Rehab  Education need identified 04/25/23       AED/CPR: - Group verbal and written instruction with the use of models to demonstrate the basic use of the AED with the basic ABC's of resuscitation.    Anatomy and Cardiac Procedures: - Group verbal and visual presentation and models provide information about basic cardiac anatomy and function. Reviews the testing methods done to diagnose heart disease and the outcomes of the test results. Describes the treatment choices: Medical Management, Angioplasty, or Coronary Bypass Surgery for treating various heart conditions including Myocardial Infarction, Angina, Valve Disease, and Cardiac Arrhythmias.  Written material given at graduation.   Medication Safety: - Group verbal and visual instruction to review commonly prescribed medications for heart and lung disease. Reviews the medication, class of the drug, and side effects. Includes the steps to properly store meds and maintain the prescription regimen.  Written material given at graduation.   Other: -Provides group and verbal instruction on various topics (see comments)  Knowledge Questionnaire Score:  Knowledge Questionnaire Score - 04/25/23 1529       Knowledge Questionnaire Score   Pre Score 16/18              Core Components/Risk Factors/Patient Goals at Admission:  Personal Goals and Risk  Factors at Admission - 04/25/23 1529       Core Components/Risk Factors/Patient Goals on Admission    Weight Management Yes;Weight Loss    Intervention Weight Management: Develop a combined nutrition and exercise program designed to reach desired caloric intake, while maintaining appropriate intake of nutrient and fiber, sodium and fats, and appropriate energy expenditure required for the weight goal.;Weight Management: Provide education and appropriate resources to help participant work on and attain dietary goals.;Weight Management/Obesity: Establish reasonable short term and long term weight goals.    Admit Weight 149 lb 14.4 oz (68 kg)    Goal Weight: Short Term 144 lb (65.3 kg)    Goal Weight: Long Term 139 lb (63 kg)    Expected Outcomes Short Term: Continue to assess and modify interventions until short term weight is achieved;Long Term: Adherence to nutrition and physical activity/exercise program aimed toward attainment of established weight goal;Weight Loss: Understanding of general recommendations for a balanced deficit meal plan, which promotes 1-2 lb weight loss per week and includes a negative energy balance of [phone] kcal/d;Understanding recommendations for meals to include 15-35% energy as protein, 25-35% energy from fat, 35-60% energy from carbohydrates, less than 200mg  of dietary cholesterol, 20-35 gm of total fiber daily;Understanding of distribution of calorie intake throughout the day with the consumption of 4-5 meals/snacks    Improve shortness of breath with ADL's Yes    Intervention Provide education, individualized exercise plan and daily activity instruction to help decrease symptoms of SOB with activities of daily living.    Expected Outcomes Short Term: Improve cardiorespiratory fitness to achieve a reduction of symptoms when performing ADLs;Long Term: Be able to perform more ADLs without symptoms or delay the onset of symptoms    Lipids Yes    Intervention Provide  education and support for participant on nutrition & aerobic/resistive exercise along with prescribed medications to achieve LDL 70mg , HDL >40mg .    Expected Outcomes Short Term: Participant states understanding of desired cholesterol values and is compliant with medications prescribed. Participant is following exercise prescription and nutrition guidelines.;Long Term: Cholesterol controlled with medications as prescribed, with individualized exercise RX and with personalized nutrition plan. Value goals: LDL < 70mg , HDL > 40 mg.             Education:Diabetes - Individual verbal and written instruction to review signs/symptoms of diabetes, desired ranges of glucose level fasting, after meals and with exercise. Acknowledge that pre and post exercise glucose checks will be done for 3 sessions at entry of program.   Know Your Numbers and Heart Failure: - Group verbal and visual instruction to discuss disease risk factors for cardiac and pulmonary disease and treatment options.  Reviews associated critical values for Overweight/Obesity, Hypertension, Cholesterol, and Diabetes.  Discusses basics of heart failure: signs/symptoms and treatments.  Introduces Heart Failure Zone chart for action plan for heart failure.  Written material given at graduation.   Core Components/Risk Factors/Patient Goals Review:   Goals and Risk Factor Review     Row Name 06/01/23 1118 07/23/23 1140 08/03/23 1106         Core Components/Risk Factors/Patient Goals Review   Personal Goals Review Improve shortness of breath with ADL's Improve shortness of breath  with ADL's Weight Management/Obesity     Review Spoke to patient about their shortness of breath and what they can do to improve. Patient has been informed of breathing techniques when starting the program. Patient is informed to tell staff if they have had any med changes and that certain meds they are taking or not taking can be causing shortness of breath.  Kiaria is doign so much better with breathing since starting program. She can do more ADLs and feels stronger when doing them. She is aware of techniques taught during the program. Reminded her to take her meds and inform staff if any of them change Jasie would like to lose some weight. Her weight today was 146 pounds. She would like to get down to 115 pounds. Informed her to start small and work her way to reaching her weight goal.     Expected Outcomes Short: Attend [facility] regularly to improve shortness of breath with ADL's. Long: maintain independence with ADL's STG: Continue to attend [facility] rehab and stay active outside of rehab. LTG: Maintain independence with ADLs Short: lose a few pounds in the next few weeks. Long: reach weight goal.              Core Components/Risk Factors/Patient Goals at Discharge (Final Review):   Goals and Risk Factor Review - 08/03/23 1106       Core Components/Risk Factors/Patient Goals Review   Personal Goals Review Weight Management/Obesity    Review Amsi would like to lose some weight. Her weight today was 146 pounds. She would like to get down to 115 pounds. Informed her to start small and work her way to reaching her weight goal.    Expected Outcomes Short: lose a few pounds in the next few weeks. Long: reach weight goal.             ITP Comments:  ITP Comments     Row Name 04/19/23 1345 04/25/23 1515 05/02/23 1114 05/07/23 1117 05/30/23 1133   ITP Comments Initial phone call completed. Diagnosis can be found in [facility] 10/31. EP Orientation scheduled for Wednesday 11/13 at 1pm. Completed and gym orientation. Initial ITP created and sent for review to Dr. Jinny Sanders, Medical Director. 30 Day review completed. Medical Director ITP review done, changes made as directed, and signed approval by Medical Director.    new to program First full day of exercise!  Patient was oriented to gym and equipment including functions, settings, policies, and  procedures.  Patient's individual exercise prescription and treatment plan were reviewed.  All starting workloads were established based on the results of the 6 minute walk test done at initial orientation visit.  The plan for exercise progression was also introduced and progression will be customized based on patient's performance and goals. 30 Day review completed. Medical Director ITP review done, changes made as directed, and signed approval by Medical Director.    new to program    Row Name 06/27/23 1458 07/25/23 0758 08/22/23 0753       ITP Comments 30 Day review completed. Medical Director ITP review done, changes made as directed, and signed approval by Medical Director.    new to program 30 Day review completed. Medical Director ITP review done, changes made as directed, and signed approval by Medical Director. 30 Day review completed. Medical Director ITP review done, changes made as directed, and signed approval by Medical Director.              Comments: 30 Day  review completed. Medical Director ITP review done, changes made as directed, and signed approval by Medical Director.

## 2023-08-24 ENCOUNTER — Encounter: Payer: BC Managed Care – PPO | Admitting: *Deleted

## 2023-08-24 DIAGNOSIS — J8410 Pulmonary fibrosis, unspecified: Secondary | ICD-10-CM

## 2023-08-24 NOTE — Progress Notes (Signed)
 Daily Session Note  Patient Details  Name: MAHIRA PETRAS MRN: [id number] Date of Birth: [birth year] Referring Provider:   Flowsheet Row Pulmonary Rehab from 04/25/2023 in [facility] Cardiac and Pulmonary Rehab  Referring Provider Bethann Punches, MD       Encounter Date: 08/24/2023  Check In:  Session Check In - 08/24/23 1103       Check-In   Supervising physician immediately available to respond to emergencies See telemetry face sheet for immediately available ER MD    Location [facility]-Cardiac & Pulmonary Rehab    Staff Present Cora Collum, RN, BSN, CCRP;Noah Tickle, BS, Exercise Physiologist;Joseph Hood RCP,RRT,BSRT    Virtual Visit No    Medication changes reported     No    Fall or balance concerns reported    No    Warm-up and Cool-down Performed on first and last piece of equipment    Resistance Training Performed Yes    VAD Patient? No    PAD/SET Patient? No      Pain Assessment   Currently in Pain? No/denies                Social History   Tobacco Use  Smoking Status Former   Current packs/day: 0.00   Average packs/day: 1 pack/day for 30.0 years (30.0 ttl pk-yrs)   Types: Cigarettes   Start date: 03/05/1983   Quit date: 06/12/2012   Years since quitting: 11.2  Smokeless Tobacco Never  Tobacco Comments   1 ppd for 30 years     Goals Met:  Proper associated with RPD/PD & O2 Sat Independence with exercise equipment Exercise tolerated well No report of concerns or symptoms today  Goals Unmet:  Not Applicable  Comments: Pt able to follow exercise prescription today without complaint.  Will continue to monitor for progression.    Dr. Bethann Punches is Medical Director for [facility] Cardiac Rehabilitation.  Dr. Vida Rigger is Medical Director for [facility] Pulmonary Rehabilitation.

## 2023-08-27 ENCOUNTER — Encounter: Payer: BC Managed Care – PPO | Admitting: *Deleted

## 2023-08-27 DIAGNOSIS — J8410 Pulmonary fibrosis, unspecified: Secondary | ICD-10-CM

## 2023-08-27 NOTE — Progress Notes (Signed)
 Daily Session Note  Patient Details  Name: Denise Mason MRN: [id number] Date of Birth: 11-12-1955 Referring Provider:   Flowsheet Row Pulmonary Rehab from 04/25/2023 in [facility] Cardiac and Pulmonary Rehab  Referring Provider Bethann Punches, MD       Encounter Date: 08/27/2023  Check In:  Session Check In - 08/27/23 1132       Check-In   Supervising physician immediately available to respond to emergencies See telemetry face sheet for immediately available ER MD    Location [facility]-Cardiac & Pulmonary Rehab    Staff Present Rory Percy, MS, Exercise Physiologist;Tambria Pfannenstiel, RN, BSN, CCRP;Meredith Jewel Baize RN,BSN;Kelly [employer], ACSM CEP, Exercise Physiologist    Virtual Visit No    Medication changes reported     No    Fall or balance concerns reported    No    Warm-up and Cool-down Performed on first and last piece of equipment    Resistance Training Performed Yes    VAD Patient? No    PAD/SET Patient? No      Pain Assessment   Currently in Pain? No/denies                Social History   Tobacco Use  Smoking Status Former   Current packs/day: 0.00   Average packs/day: 1 pack/day for 30.0 years (30.0 ttl pk-yrs)   Types: Cigarettes   Start date: 03/05/1983   Quit date: 06/12/2012   Years since quitting: 11.2  Smokeless Tobacco Never  Tobacco Comments   1 ppd for 30 years     Goals Met:  Proper associated with RPD/PD & O2 Sat Independence with exercise equipment Exercise tolerated well No report of concerns or symptoms today  Goals Unmet:  Not Applicable  Comments: Pt able to follow exercise prescription today without complaint.  Will continue to monitor for progression.    Dr. Bethann Punches is Medical Director for [facility] Cardiac Rehabilitation.  Dr. Vida Rigger is Medical Director for [facility] Pulmonary Rehabilitation.

## 2023-08-29 ENCOUNTER — Encounter: Payer: BC Managed Care – PPO | Admitting: *Deleted

## 2023-08-29 DIAGNOSIS — J8410 Pulmonary fibrosis, unspecified: Secondary | ICD-10-CM

## 2023-08-29 NOTE — Progress Notes (Signed)
 Daily Session Note  Patient Details  Name: Denise Mason MRN: [id number] Date of Birth: Jul 05, 1955 Referring Provider:   Flowsheet Row Pulmonary Rehab from 04/25/2023 in [facility] Cardiac and Pulmonary Rehab  Referring Provider Bethann Punches, MD       Encounter Date: 08/29/2023  Check In:  Session Check In - 08/29/23 1136       Check-In   Supervising physician immediately available to respond to emergencies See telemetry face sheet for immediately available ER MD    Location [facility]-Cardiac & Pulmonary Rehab    Staff Present Cora Collum, RN, BSN, CCRP;Joseph Hood RCP,RRT,BSRT;Noah Tickle, [place], Exercise Physiologist;Maxon Conetta BS, Exercise Physiologist    Virtual Visit No    Medication changes reported     No    Fall or balance concerns reported    No    Warm-up and Cool-down Performed on first and last piece of equipment    Resistance Training Performed Yes    VAD Patient? No    PAD/SET Patient? No      Pain Assessment   Currently in Pain? No/denies                Social History   Tobacco Use  Smoking Status Former   Current packs/day: 0.00   Average packs/day: 1 pack/day for 30.0 years (30.0 ttl pk-yrs)   Types: Cigarettes   Start date: 03/05/1983   Quit date: 06/12/2012   Years since quitting: 11.2  Smokeless Tobacco Never  Tobacco Comments   1 ppd for 30 years     Goals Met:  Proper associated with RPD/PD & O2 Sat Independence with exercise equipment Exercise tolerated well No report of concerns or symptoms today  Goals Unmet:  Not Applicable  Comments: Pt able to follow exercise prescription today without complaint.  Will continue to monitor for progression.    Dr. Bethann Punches is Medical Director for [facility] Cardiac Rehabilitation.  Dr. Vida Rigger is Medical Director for [facility] Pulmonary Rehabilitation.

## 2023-08-31 ENCOUNTER — Encounter: Payer: BC Managed Care – PPO | Admitting: *Deleted

## 2023-08-31 DIAGNOSIS — J8410 Pulmonary fibrosis, unspecified: Secondary | ICD-10-CM | POA: Diagnosis not present

## 2023-08-31 NOTE — Progress Notes (Signed)
 Daily Session Note  Patient Details  Name: Denise Mason MRN: [id number] Date of Birth: Feb 08, 1956 Referring Provider:   Flowsheet Row Pulmonary Rehab from 04/25/2023 in [facility] Cardiac and Pulmonary Rehab  Referring Provider Bethann Punches, MD       Encounter Date: 08/31/2023  Check In:  Session Check In - 08/31/23 1152       Check-In   Supervising physician immediately available to respond to emergencies See telemetry face sheet for immediately available ER MD    Location [facility]-Cardiac & Pulmonary Rehab    Staff Present Rory Percy, MS, Exercise Physiologist;Adalid Beckmann, RN, BSN, CCRP;Joseph Hood RCP,RRT,BSRT    Virtual Visit No    Medication changes reported     No    Fall or balance concerns reported    No    Warm-up and Cool-down Performed on first and last piece of equipment    Resistance Training Performed Yes    VAD Patient? No    PAD/SET Patient? No      Pain Assessment   Currently in Pain? No/denies                Social History   Tobacco Use  Smoking Status Former   Current packs/day: 0.00   Average packs/day: 1 pack/day for 30.0 years (30.0 ttl pk-yrs)   Types: Cigarettes   Start date: 03/05/1983   Quit date: 06/12/2012   Years since quitting: 11.2  Smokeless Tobacco Never  Tobacco Comments   1 ppd for 30 years     Goals Met:  Proper associated with RPD/PD & O2 Sat Independence with exercise equipment Exercise tolerated well No report of concerns or symptoms today  Goals Unmet:  Not Applicable  Comments: Pt able to follow exercise prescription today without complaint.  Will continue to monitor for progression.    Dr. Bethann Punches is Medical Director for [facility] Cardiac Rehabilitation.  Dr. Vida Rigger is Medical Director for [facility] Pulmonary Rehabilitation.

## 2023-09-03 ENCOUNTER — Encounter: Admitting: *Deleted

## 2023-09-03 DIAGNOSIS — J8410 Pulmonary fibrosis, unspecified: Secondary | ICD-10-CM | POA: Diagnosis not present

## 2023-09-03 NOTE — Progress Notes (Signed)
 Daily Session Note  Patient Details  Name: MCCALL WILL MRN: [id number] Date of Birth: 30-Aug-1955 Referring Provider:   Flowsheet Row Pulmonary Rehab from 04/25/2023 in [facility] Cardiac and Pulmonary Rehab  Referring Provider Bethann Punches, MD       Encounter Date: 09/03/2023  Check In:  Session Check In - 09/03/23 1627       Check-In   Supervising physician immediately available to respond to emergencies See telemetry face sheet for immediately available ER MD    Location [facility]-Cardiac & Pulmonary Rehab    Staff Present Rory Percy, MS, Exercise Physiologist;Maxon Suzzette Righter, Exercise Physiologist;Kelly Cloretta Ned, ACSM CEP, Exercise Physiologist;Evamaria Detore, RN, BSN, CCRP;Meredith Craven RN,BSN    Virtual Visit No    Medication changes reported     No    Fall or balance concerns reported    No    Warm-up and Cool-down Performed on first and last piece of equipment    Resistance Training Performed Yes    VAD Patient? No    PAD/SET Patient? No      Pain Assessment   Currently in Pain? No/denies                Social History   Tobacco Use  Smoking Status Former   Current packs/day: 0.00   Average packs/day: 1 pack/day for 30.0 years (30.0 ttl pk-yrs)   Types: Cigarettes   Start date: 03/05/1983   Quit date: 06/12/2012   Years since quitting: 11.2  Smokeless Tobacco Never  Tobacco Comments   1 ppd for 30 years     Goals Met:  Proper associated with RPD/PD & O2 Sat Independence with exercise equipment Exercise tolerated well No report of concerns or symptoms today  Goals Unmet:  Not Applicable  Comments: Pt able to follow exercise prescription today without complaint.  Will continue to monitor for progression.    Dr. Bethann Punches is Medical Director for [facility] Cardiac Rehabilitation.  Dr. Vida Rigger is Medical Director for [facility] Pulmonary Rehabilitation.

## 2023-09-05 ENCOUNTER — Encounter: Admitting: *Deleted

## 2023-09-05 DIAGNOSIS — J8410 Pulmonary fibrosis, unspecified: Secondary | ICD-10-CM

## 2023-09-05 NOTE — Progress Notes (Signed)
 Discharge Note for  Denise Mason     03/19/1956         Denise Mason graduated today from  rehab with 36 sessions completed.  Details of the patient's exercise prescription and what She needs to do in order to continue the prescription and progress were discussed with patient.  Patient was given a copy of prescription and goals.  Patient verbalized understanding. [name] plans to continue to exercise by joining the [employer] at [facility].    6 Minute Walk     Row Name 04/25/23 1516 08/22/23 1125       6 Minute Walk   Phase Initial Discharge    Distance 1225 feet 1400 feet    Distance % Change -- 14.3 %    Distance Feet Change -- 175 ft    Walk Time 6 minutes 6 minutes    # of Rest Breaks 0 0    MPH 2.3 2.65    METS 2.81 3.21    RPE 11 11    Perceived Dyspnea  1 1    VO2 Peak 9.83 11.24    Symptoms No No    Resting HR 88 bpm 106 bpm    Resting BP 110/70 100/56    Resting Oxygen Saturation  96 % 94 %    Exercise Oxygen Saturation  during 6 min walk 87 % 85 %    Max Ex. HR 116 bpm 125 bpm    Max Ex. BP 112/76 116/60    2 Minute Post BP 108/72 104/58      Interval HR   1 Minute HR 110 115    2 Minute HR 112 119    3 Minute HR 112 122    4 Minute HR 113 122    5 Minute HR 116 123    6 Minute HR 114 125    2 Minute Post HR 95 108    Interval Heart Rate? Yes Yes      Interval Oxygen   Interval Oxygen? Yes Yes    Baseline Oxygen Saturation % 96 % 94 %    1 Minute Oxygen Saturation % 93 % 90 %    1 Minute Liters of Oxygen 0 L 0 L  RA    2 Minute Oxygen Saturation % 87 % 87 %    2 Minute Liters of Oxygen 0 L 0 L    3 Minute Oxygen Saturation % 87 % 85 %    3 Minute Liters of Oxygen 0 L 0 L    4 Minute Oxygen Saturation % 87 % 85 %    4 Minute Liters of Oxygen 0 L 0 L    5 Minute Oxygen Saturation % 88 % 86 %    5 Minute Liters of Oxygen 0 L 0 L    6 Minute Oxygen Saturation % 88 % 86 %    6 Minute Liters of Oxygen 0 L 0 L    2 Minute Post Oxygen Saturation % 95 % 97 %     2 Minute Post Liters of Oxygen 0 L 0 L

## 2023-09-05 NOTE — Progress Notes (Signed)
 Pulmonary Individual Treatment Plan  Patient Details  Name: Denise Mason MRN: [id number] Date of Birth: July 10, 1955 Referring Provider:   Flowsheet Row Pulmonary Rehab from 04/25/2023 in [facility] Cardiac and Pulmonary Rehab  Referring Provider Bethann Punches, MD       Initial Encounter Date:  Flowsheet Row Pulmonary Rehab from 04/25/2023 in [facility] Cardiac and Pulmonary Rehab  Date 04/25/23       Visit Diagnosis: Pulmonary fibrosis (HCC)  Patient's Home Medications on Admission:  Current Outpatient Medications:    albuterol (ACCUNEB) 1.25 MG/3ML nebulizer solution, Take 1 ampule by nebulization every 6 (six) hours as needed., Disp: , Rfl:    albuterol (VENTOLIN HFA) 108 (90 Base) MCG/ACT inhaler, Inhale into the lungs., Disp: , Rfl:    alendronate (FOSAMAX) 70 MG tablet, Take 1 tablet (70 mg total) by mouth every 7 (seven) days. Take with a full glass of water on an empty stomach., Disp: 12 tablet, Rfl: 3   ALPRAZolam (XANAX) 0.5 MG tablet, Take 1 tablet (0.5 mg total) by mouth 2 (two) times daily as needed. for anxiety (Patient not taking: Reported on 04/19/2023), Disp: 10 tablet, Rfl: 0   ammonium lactate (LAC-HYDRIN) 12 % lotion, APPLY TOPICALLY AS NEEDED FOR DRY SKIN (Patient not taking: Reported on 04/19/2023), Disp: 400 g, Rfl: 0   ARMOUR THYROID 30 MG tablet, TAKE 1 TABLET BY MOUTH EVERY DAY EXCEPT TAKE 1 1/2 TABLETS ON SUNDAY (Patient not taking: Reported on 04/19/2023), Disp: 90 tablet, Rfl: 2   aspirin EC 81 MG tablet, Take 1 tablet (81 mg total) by mouth daily. (Patient not taking: Reported on 04/19/2023), Disp: 30 tablet, Rfl: 5   Coenzyme Q10 (COQ-10) 100 MG CAPS, Take 1 capsule by mouth 3 (three) times a week. (Patient not taking: Reported on 04/19/2023), Disp: 100 capsule, Rfl: 0   DULoxetine (CYMBALTA) 60 MG capsule, Take 1 capsule (60 mg total) by mouth daily. (Patient not taking: Reported on 04/19/2023), Disp: 90 capsule, Rfl: 1   ezetimibe (ZETIA) 10 MG tablet, Take 1 tablet (10  mg total) by mouth daily. For cholesterol (Patient not taking: Reported on 04/19/2023), Disp: 90 tablet, Rfl: 1   Fluticasone-Umeclidin-Vilant (TRELEGY ELLIPTA) 100-62.5-25 MCG/ACT AEPB, Inhale into the lungs., Disp: , Rfl:    gabapentin (NEURONTIN) 300 MG capsule, Take 1 capsule (300 mg total) by mouth at bedtime. (Patient not taking: Reported on 04/19/2023), Disp: 90 capsule, Rfl: 1   hydrOXYzine (ATARAX/VISTARIL) 10 MG tablet, Take 1 tablet (10 mg total) by mouth 3 (three) times daily as needed. (Patient not taking: Reported on 04/19/2023), Disp: 30 tablet, Rfl: 0   Krill Oil Omega-3 500 MG CAPS, Take 1 capsule by mouth daily. (Patient not taking: Reported on 04/19/2023), Disp: 30 capsule, Rfl: 0   levothyroxine (SYNTHROID) 50 MCG tablet, Take by mouth., Disp: , Rfl:    nitroGLYCERIN (NITROSTAT) 0.4 MG SL tablet, Place 1 tablet (0.4 mg total) under the tongue every 5 (five) minutes as needed for chest pain. (Patient not taking: Reported on 04/19/2023), Disp: 20 tablet, Rfl: 2   PREMARIN vaginal cream, INSERT 1 APPLICATORFUL VAGINALLY 2 TIMES A WEEK (Patient not taking: Reported on 04/19/2023), Disp: 30 g, Rfl: 10   rosuvastatin (CRESTOR) 10 MG tablet, Take 1 tablet (10 mg total) by mouth 3 (three) times a week. TAKE 1 TABLET(10 MG) BY MOUTH DAILY, Disp: 36 tablet, Rfl: 1   valACYclovir (VALTREX) 1000 MG tablet, Take 1 tablet (1,000 mg total) by mouth 2 (two) times daily. (Patient not taking: Reported on  02/26/2020), Disp: 10 tablet, Rfl: 0  Past Medical History: Past Medical History:  Diagnosis Date   ADD (attention deficit disorder with hyperactivity)    CAD (coronary artery disease)    mild, non-obstructive. by cath 02/16/09 (luminal irreg mid LAD and mid RCA)    Cancer (HCC)    basil   Depression    HLD (hyperlipidemia)    Hypothyroidism    Osteopenia     Tobacco Use: Social History   Tobacco Use  Smoking Status Former   Current packs/day: 0.00   Average packs/day: 1 pack/day for 30.0  years (30.0 ttl pk-yrs)   Types: Cigarettes   Start date: 03/05/1983   Quit date: 06/12/2012   Years since quitting: 11.2  Smokeless Tobacco Never  Tobacco Comments   1 ppd for 30 years     Labs: Review Flowsheet  More data exists      Latest Ref Rng & Units 03/08/2015 03/31/2016 11/21/2016 11/27/2017 06/27/2019  Labs for ITP Cardiac and Pulmonary Rehab  Cholestrol <200 mg/dL 409  811  914  782  956   LDL (calc) mg/dL (calc) 213  086  578  469  156   HDL-C > OR = 50 mg/dL 48  50  52  53  43   Trlycerides <150 mg/dL 629  528  413  244  010   Hemoglobin A1c <5.7 % of total Hgb - 5.6  - 5.5  5.8      Pulmonary Assessment Scores:  Pulmonary Assessment Scores     Row Name 04/25/23 1523 08/24/23 1105       ADL UCSD   ADL Phase Entry Exit    SOB Score total 48 31    Rest 1 0    Walk 2 1    Stairs 5 4    Bath 0 0    Dress 0 0    Shop 2 1      CAT Score   CAT Score 17 12      mMRC Score   mMRC Score 1 1             UCSD: Self-administered rating of dyspnea associated with activities of daily living (ADLs) 6-point scale (0 = "not at all" to 5 = "maximal or unable to do because of breathlessness")  Scoring Scores range from 0 to 120.  Minimally important difference is 5 units  CAT: CAT can identify the health impairment of COPD patients and is better correlated with disease progression.  CAT has a scoring range of zero to 40. The CAT score is classified into four groups of low (less than 10), medium (10 - 20), high (21-30) and very high (31-40) based on the impact level of disease on health status. A CAT score over 10 suggests significant symptoms.  A worsening CAT score could be explained by an exacerbation, poor medication adherence, poor inhaler technique, or progression of COPD or comorbid conditions.  CAT MCID is 2 points  mMRC: mMRC (Modified Medical Research Council) Dyspnea Scale is used to assess the degree of baseline functional disability in patients of  respiratory disease due to dyspnea. No minimal important difference is established. A decrease in score of 1 point or greater is considered a positive change.   Pulmonary Function Assessment:   Exercise Target Goals: Exercise Program Goal: Individual exercise prescription set using results from initial 6 min walk test and THRR while considering  patient's activity barriers and safety.   Exercise Prescription Goal: Initial exercise  prescription builds to 30-45 minutes a day of aerobic activity, 2-3 days per week.  Home exercise guidelines will be given to patient during program as part of exercise prescription that the participant will acknowledge.  Education: Aerobic Exercise: - Group verbal and visual presentation on the components of exercise prescription. Introduces F.I.T.T principle from ACSM for exercise prescriptions.  Reviews F.I.T.T. principles of aerobic exercise including progression. Written material given at graduation.   Education: Resistance Exercise: - Group verbal and visual presentation on the components of exercise prescription. Introduces F.I.T.T principle from ACSM for exercise prescriptions  Reviews F.I.T.T. principles of resistance exercise including progression. Written material given at graduation.    Education: Exercise & Equipment Safety: - Individual verbal instruction and demonstration of equipment use and safety with use of the equipment. Flowsheet Row Pulmonary Rehab from 04/25/2023 in [facility] Cardiac and Pulmonary Rehab  Date 04/25/23  Educator MB  Instruction Review Code 1- Verbalizes Understanding       Education: Exercise Physiology & General Exercise Guidelines: - Group verbal and written instruction with models to review the exercise physiology of the cardiovascular system and associated critical values. Provides general exercise guidelines with specific guidelines to those with heart or lung disease.    Education: Flexibility, Balance, Mind/Body  Relaxation: - Group verbal and visual presentation with interactive activity on the components of exercise prescription. Introduces F.I.T.T principle from ACSM for exercise prescriptions. Reviews F.I.T.T. principles of flexibility and balance exercise training including progression. Also discusses the mind body connection.  Reviews various relaxation techniques to help reduce and manage stress (i.e. Deep breathing, progressive muscle relaxation, and visualization). Balance handout provided to take home. Written material given at graduation.   Activity Barriers & Risk Stratification:  Activity Barriers & Cardiac Risk Stratification - 04/25/23 1518       Activity Barriers & Cardiac Risk Stratification   Activity Barriers Joint Problems;Shortness of Breath;Other (comment)    Comments Osteoporosis             6 Minute Walk:  6 Minute Walk     Row Name 04/25/23 1516 08/22/23 1125       6 Minute Walk   Phase Initial Discharge    Distance 1225 feet 1400 feet    Distance % Change -- 14.3 %    Distance Feet Change -- 175 ft    Walk Time 6 minutes 6 minutes    # of Rest Breaks 0 0    MPH 2.3 2.65    METS 2.81 3.21    RPE 11 11    Perceived Dyspnea  1 1    VO2 Peak 9.83 11.24    Symptoms No No    Resting HR 88 bpm 106 bpm    Resting BP 110/70 100/56    Resting Oxygen Saturation  96 % 94 %    Exercise Oxygen Saturation  during 6 min walk 87 % 85 %    Max Ex. HR 116 bpm 125 bpm    Max Ex. BP 112/76 116/60    2 Minute Post BP 108/72 104/58      Interval HR   1 Minute HR 110 115    2 Minute HR 112 119    3 Minute HR 112 122    4 Minute HR 113 122    5 Minute HR 116 123    6 Minute HR 114 125    2 Minute Post HR 95 108    Interval Heart Rate? Yes Yes  Interval Oxygen   Interval Oxygen? Yes Yes    Baseline Oxygen Saturation % 96 % 94 %    1 Minute Oxygen Saturation % 93 % 90 %    1 Minute Liters of Oxygen 0 L 0 L  RA    2 Minute Oxygen Saturation % 87 % 87 %    2  Minute Liters of Oxygen 0 L 0 L    3 Minute Oxygen Saturation % 87 % 85 %    3 Minute Liters of Oxygen 0 L 0 L    4 Minute Oxygen Saturation % 87 % 85 %    4 Minute Liters of Oxygen 0 L 0 L    5 Minute Oxygen Saturation % 88 % 86 %    5 Minute Liters of Oxygen 0 L 0 L    6 Minute Oxygen Saturation % 88 % 86 %    6 Minute Liters of Oxygen 0 L 0 L    2 Minute Post Oxygen Saturation % 95 % 97 %    2 Minute Post Liters of Oxygen 0 L 0 L            Oxygen Initial Assessment:  Oxygen Initial Assessment - 04/19/23 1335       Home Oxygen   Home Oxygen Device None    Sleep Oxygen Prescription None    Home Exercise Oxygen Prescription None    Home Resting Oxygen Prescription None    Compliance with Home Oxygen Use Yes      Intervention   Short Term Goals To learn and understand importance of monitoring SPO2 with pulse oximeter and demonstrate accurate use of the pulse oximeter.;To learn and understand importance of maintaining oxygen saturations>88%;To learn and demonstrate proper pursed lip breathing techniques or other breathing techniques. ;To learn and demonstrate proper use of respiratory medications    Long  Term Goals Verbalizes importance of monitoring SPO2 with pulse oximeter and return demonstration;Maintenance of O2 saturations>88%;Exhibits proper breathing techniques, such as pursed lip breathing or other method taught during program session;Compliance with respiratory medication;Demonstrates proper use of MDI's             Oxygen Re-Evaluation:  Oxygen Re-Evaluation     Row Name 05/07/23 1118 06/01/23 1117 08/03/23 1105         Program Oxygen Prescription   Program Oxygen Prescription -- None None       Home Oxygen   Home Oxygen Device -- None None     Sleep Oxygen Prescription -- None None     Home Exercise Oxygen Prescription -- None None     Home Resting Oxygen Prescription -- None None       Goals/Expected Outcomes   Short Term Goals -- To learn and  understand importance of maintaining oxygen saturations>88%;To learn and understand importance of monitoring SPO2 with pulse oximeter and demonstrate accurate use of the pulse oximeter. To learn and demonstrate proper pursed lip breathing techniques or other breathing techniques.      Long  Term Goals -- Maintenance of O2 saturations>88%;Verbalizes importance of monitoring SPO2 with pulse oximeter and return demonstration Exhibits proper breathing techniques, such as pursed lip breathing or other method taught during program session     Comments Reviewed PLB technique with pt.  Talked about how it works and it's importance in maintaining their exercise saturations. She has a pulse oximeter to check her oxygen saturation at home. Informed and explained why it is important to have one. Reviewed that  oxygen saturations should be 88 percent and above. Patient verbalizes understanding. Informed patient how to perform the Pursed Lipped breathing technique. Told patient to Inhale through the nose and out the mouth with pursed lips to keep their airways open, help oxygenate them better, practice when at rest or doing strenuous activity. Patient Verbalizes understanding of technique and will work on and be reiterated during [facility].     Goals/Expected Outcomes Short: Become more profiecient at using PLB. Long: Become independent at using PLB. Short: monitor oxygen at home with exertion. Long: maintain oxygen saturations above 88 percent independently. Short: use PLB with exertion. Long: use PLB on exertion proficiently and independently.              Oxygen Discharge (Final Oxygen Re-Evaluation):  Oxygen Re-Evaluation - 08/03/23 1105       Program Oxygen Prescription   Program Oxygen Prescription None      Home Oxygen   Home Oxygen Device None    Sleep Oxygen Prescription None    Home Exercise Oxygen Prescription None    Home Resting Oxygen Prescription None      Goals/Expected Outcomes   Short  Term Goals To learn and demonstrate proper pursed lip breathing techniques or other breathing techniques.     Long  Term Goals Exhibits proper breathing techniques, such as pursed lip breathing or other method taught during program session    Comments Informed patient how to perform the Pursed Lipped breathing technique. Told patient to Inhale through the nose and out the mouth with pursed lips to keep their airways open, help oxygenate them better, practice when at rest or doing strenuous activity. Patient Verbalizes understanding of technique and will work on and be reiterated during [facility].    Goals/Expected Outcomes Short: use PLB with exertion. Long: use PLB on exertion proficiently and independently.             Initial Exercise Prescription:  Initial Exercise Prescription - 04/25/23 1500       Date of Initial Exercise RX and Referring Provider   Date 04/25/23    Referring Provider Bethann Punches, MD      Oxygen   Maintain Oxygen Saturation 88% or higher      Treadmill   MPH 2.3    Grade 0    Minutes 15    METs 2.76      Elliptical   Level 1   Try Elliptical   Speed 3    Minutes 15    METs 2.81      REL-XR   Level 2    Speed 50    Minutes 15    METs 2.81      T5 Nustep   Level 2    SPM 80    Minutes 15    METs 2.81      Track   Laps 33    Minutes 15    METs 2.79      Prescription Details   Frequency (times per week) 3    Duration Progress to 30 minutes of continuous aerobic without signs/symptoms of physical distress      Intensity   THRR 40-80% of Max Heartrate 114-141    Ratings of Perceived Exertion 11-13    Perceived Dyspnea 0-4      Progression   Progression Continue to progress workloads to maintain intensity without signs/symptoms of physical distress.      Resistance Training   Training Prescription Yes    Weight 2 lb  Reps 10-15             Perform Capillary Blood Glucose checks as needed.  Exercise Prescription  Changes:   Exercise Prescription Changes     Row Name 04/25/23 1500 05/23/23 1400 06/05/23 0900 06/20/23 1200 06/21/23 1600     Response to Exercise   Blood Pressure (Admit) 110/70 102/64 112/62 -- 112/64   Blood Pressure (Exercise) 112/76 122/60 134/74 -- --   Blood Pressure (Exit) 108/72 100/60 110/64 -- 104/60   Heart Rate (Admit) 88 bpm 95 bpm 89 bpm -- 95 bpm   Heart Rate (Exercise) 116 bpm 125 bpm 120 bpm -- 116 bpm   Heart Rate (Exit) 95 bpm 105 bpm 102 bpm -- 107 bpm   Oxygen Saturation (Admit) 96 % 93 % 95 % -- 94 %   Oxygen Saturation (Exercise) 87 % 88 % 88 % -- 89 %   Oxygen Saturation (Exit) 95 % 94 % 94 % -- 94 %   Rating of Perceived Exertion (Exercise) 11 15 13  -- 13   Perceived Dyspnea (Exercise) 1 1 0 -- 0   Symptoms none none none -- none   Comments results First three days of exercise -- -- --   Duration Progress to 30 minutes of  aerobic without signs/symptoms of physical distress Continue with 30 min of aerobic exercise without signs/symptoms of physical distress. Continue with 30 min of aerobic exercise without signs/symptoms of physical distress. Continue with 30 min of aerobic exercise without signs/symptoms of physical distress. Continue with 30 min of aerobic exercise without signs/symptoms of physical distress.   Intensity THRR New THRR unchanged THRR unchanged THRR unchanged THRR unchanged     Progression   Progression Continue to progress workloads to maintain intensity without signs/symptoms of physical distress. Continue to progress workloads to maintain intensity without signs/symptoms of physical distress. Continue to progress workloads to maintain intensity without signs/symptoms of physical distress. Continue to progress workloads to maintain intensity without signs/symptoms of physical distress. Continue to progress workloads to maintain intensity without signs/symptoms of physical distress.   Average METs 2.81 2.45 2.56 2.56 2.57     Resistance  Training   Training Prescription -- Yes Yes Yes Yes   Weight -- 2 lb 2 lb 2 lb 3 lb   Reps -- 10-15 10-15 10-15 10-15     Interval Training   Interval Training -- No No No No     Oxygen   Oxygen -- Continuous -- -- --     Treadmill   MPH -- 2.7 2.8 2.8 2.7   Grade -- 0 0 0 0   Minutes -- 15 15 15 15    METs -- 3.07 3.14 3.14 3.07     NuStep   Level -- 3 1 1  --   Minutes -- 15 15 15  --   METs -- 2.9 2.2 2.2 --     Arm Ergometer   Level -- 1 -- -- --   Minutes -- 15 -- -- --   METs -- 1 -- -- --     T5 Nustep   Level -- 2 1 1 1    Minutes -- 15 15 15 15    METs -- 2 2.2 2.2 2.2     Biostep-RELP   Level -- -- 1 1 --   SPM -- -- 50 50 --   Minutes -- -- 15 15 --   METs -- -- 2 2 --     Home Exercise Plan  Plans to continue exercise at -- -- -- Home (comment)  walk, hand weights, and possibly join the [facility] Home (comment)  walk, hand weights, and possibly join the [facility]   Frequency -- -- -- Add 2 additional days to program exercise sessions. Add 2 additional days to program exercise sessions.   Initial Home Exercises Provided -- -- -- 06/20/23 06/20/23     Oxygen   Maintain Oxygen Saturation -- 88% or higher 88% or higher 88% or higher 88% or higher    Row Name 07/02/23 1600 07/17/23 1400 08/01/23 1000 08/15/23 0700 08/28/23 1400     Response to Exercise   Blood Pressure (Admit) 110/60 114/62 96/68 110/60 98/60   Blood Pressure (Exit) 102/60 104/64 110/64 106/64 108/62   Heart Rate (Admit) 91 bpm 103 bpm 93 bpm 87 bpm 105 bpm   Heart Rate (Exercise) 118 bpm 124 bpm 120 bpm 109 bpm 125 bpm   Heart Rate (Exit) 103 bpm 111 bpm 106 bpm 101 bpm 88 bpm   Oxygen Saturation (Admit) 94 % 94 % 96 % 96 % 96 %   Oxygen Saturation (Exercise) 89 % 87 % 90 % 89 % 88 %   Oxygen Saturation (Exit) 93 % 93 % 96 % 94 % 92 %   Rating of Perceived Exertion (Exercise) 12 13 12 13 13    Perceived Dyspnea (Exercise) 0 0 1 1 1    Symptoms none none none none none   Duration Continue  with 30 min of aerobic exercise without signs/symptoms of physical distress. Continue with 30 min of aerobic exercise without signs/symptoms of physical distress. Continue with 30 min of aerobic exercise without signs/symptoms of physical distress. Continue with 30 min of aerobic exercise without signs/symptoms of physical distress. Continue with 30 min of aerobic exercise without signs/symptoms of physical distress.   Intensity THRR unchanged THRR unchanged THRR unchanged THRR unchanged THRR unchanged     Progression   Progression Continue to progress workloads to maintain intensity without signs/symptoms of physical distress. Continue to progress workloads to maintain intensity without signs/symptoms of physical distress. Continue to progress workloads to maintain intensity without signs/symptoms of physical distress. Continue to progress workloads to maintain intensity without signs/symptoms of physical distress. Continue to progress workloads to maintain intensity without signs/symptoms of physical distress.   Average METs 3.09 3.23 3.22 3.2 3.16     Resistance Training   Training Prescription Yes Yes Yes Yes Yes   Weight 3 lb 3 lb 3 lb 3 lb 3 lb   Reps 10-15 10-15 10-15 10-15 10-15     Interval Training   Interval Training No No No No No     Treadmill   MPH 3 3.1 2.8 3 3    Grade 0 0 0 0 0   Minutes 15 15 15 15 15    METs 3.3 3.37 3.14 3.3 3.3     NuStep   Level 4 2 3 3 4    Minutes 15 15 15 15 15    METs 3.1 3.6 3.1 3.5 3.5     REL-XR   Level 1 2 1 2 2    Minutes 15 15 15 15 15    METs 3.4 3.2 3.5 3.5 --     T5 Nustep   Level 1 2 -- 2 2   Minutes 15 15 -- 15 15   METs 2.2 2.2 -- 2.3 2.3     Biostep-RELP   Level -- -- -- 3 --   Minutes -- -- -- 15 --  Home Exercise Plan   Plans to continue exercise at Home (comment)  walk, hand weights, and possibly join the [facility]zone Home (comment)  walk, hand weights, and possibly join the [facility] Home (comment)  walk, hand weights, and  possibly join the [facility] Home (comment)  walk, hand weights, and possibly join the [facility] Home (comment)  walk, hand weights, and possibly join the wellzone   Frequency Add 2 additional days to program exercise sessions. Add 2 additional days to program exercise sessions. Add 2 additional days to program exercise sessions. Add 2 additional days to program exercise sessions. Add 2 additional days to program exercise sessions.   Initial Home Exercises Provided 06/20/23 06/20/23 06/20/23 06/20/23 06/20/23     Oxygen   Maintain Oxygen Saturation 88% or higher 88% or higher 88% or higher 88% or higher 88% or higher            Exercise Comments:   Exercise Comments     Row Name 05/07/23 1117 09/05/23 1120         Exercise Comments First full day of exercise!  Patient was oriented to gym and equipment including functions, settings, policies, and procedures.  Patient's individual exercise prescription and treatment plan were reviewed.  All starting workloads were established based on the results of the 6 minute walk test done at initial orientation visit.  The plan for exercise progression was also introduced and progression will be customized based on patient's performance and goals. Denise Mason graduated today from  rehab with 36 sessions completed.  Details of the patient's exercise prescription and what She needs to do in order to continue the prescription and progress were discussed with patient.  Patient was given a copy of prescription and goals.  Patient verbalized understanding. Denise Mason plans to continue to exercise by joining the [employer] at [facility].               Exercise Goals and Review:   Exercise Goals     Row Name 04/25/23 1522             Exercise Goals   Increase Physical Activity Yes       Intervention Provide advice, education, support and counseling about physical activity/exercise needs.;Develop an individualized exercise prescription for aerobic and  resistive training based on initial evaluation findings, risk stratification, comorbidities and participant's personal goals.       Expected Outcomes Short Term: Attend rehab on a regular basis to increase amount of physical activity.;Long Term: Add in home exercise to make exercise part of routine and to increase amount of physical activity.;Long Term: Exercising regularly at least 3-5 days a week.       Increase Strength and Stamina Yes       Intervention Provide advice, education, support and counseling about physical activity/exercise needs.;Develop an individualized exercise prescription for aerobic and resistive training based on initial evaluation findings, risk stratification, comorbidities and participant's personal goals.       Expected Outcomes Short Term: Increase workloads from initial exercise prescription for resistance, speed, and METs.;Short Term: Perform resistance training exercises routinely during rehab and add in resistance training at home;Long Term: Improve cardiorespiratory fitness, muscular endurance and strength as measured by increased METs and functional capacity ( )       Able to understand and use rate of perceived exertion (RPE) scale Yes       Intervention Provide education and explanation on how to use RPE scale       Expected Outcomes Short Term:  Able to use RPE daily in rehab to express subjective intensity level;Long Term:  Able to use RPE to guide intensity level when exercising independently       Able to understand and use Dyspnea scale Yes       Intervention Provide education and explanation on how to use Dyspnea scale       Expected Outcomes Short Term: Able to use Dyspnea scale daily in rehab to express subjective sense of shortness of breath during exertion;Long Term: Able to use Dyspnea scale to guide intensity level when exercising independently       Knowledge and understanding of Target Heart Rate Range (THRR) Yes       Intervention Provide education and  explanation of THRR including how the numbers were predicted and where they are located for reference       Expected Outcomes Short Term: Able to state/look up THRR;Short Term: Able to use daily as guideline for intensity in rehab;Long Term: Able to use THRR to govern intensity when exercising independently       Able to check pulse independently Yes       Intervention Provide education and demonstration on how to check pulse in carotid and radial arteries.;Review the importance of being able to check your own pulse for safety during independent exercise       Expected Outcomes Short Term: Able to explain why pulse checking is important during independent exercise;Long Term: Able to check pulse independently and accurately       Understanding of Exercise Prescription Yes       Intervention Provide education, explanation, and written materials on patient's individual exercise prescription       Expected Outcomes Short Term: Able to explain program exercise prescription;Long Term: Able to explain home exercise prescription to exercise independently                Exercise Goals Re-Evaluation :  Exercise Goals Re-Evaluation     Row Name 05/07/23 1117 05/23/23 1424 06/05/23 0943 06/20/23 1205 06/21/23 1604     Exercise Goal Re-Evaluation   Exercise Goals Review Able to understand and use rate of perceived exertion (RPE) scale;Able to understand and use Dyspnea scale;Knowledge and understanding of Target Heart Rate Range (THRR);Understanding of Exercise Prescription Increase Physical Activity;Increase Strength and Stamina;Understanding of Exercise Prescription Increase Physical Activity;Understanding of Exercise Prescription;Increase Strength and Stamina Increase Physical Activity;Able to understand and use rate of perceived exertion (RPE) scale;Knowledge and understanding of Target Heart Rate Range (THRR);Understanding of Exercise Prescription;Increase Strength and Stamina;Able to understand and use  Dyspnea scale;Able to check pulse independently Increase Physical Activity;Understanding of Exercise Prescription;Increase Strength and Stamina   Comments Reviewed RPE and dyspnea scale, THR and program prescription with pt today.  Pt voiced understanding and was given a copy of goals to take home. Denise Mason is off to a good start in the program. She was able to increase her treadmill workload up to a speed of 2.7 mph with no incline. She also did well at level 1 on the arm crank, level 2 on the T5 nustep, and level 3 on the T4 nustep. She also did well with 2 lb hand weights for resistance training. We will continue to monitor her progress in the program. Denise Mason is doing well in rehab. She increased her treadmill speed to 2. with no incline. We will continue to monitor her progress in the program. Reviewed home exercise with pt today.  Pt plans to walk, use hand weights and possibly join the  [facility] for exercise.  Reviewed THR, pulse, RPE, sign and symptoms, pulse oximetery and when to call 911 or MD.  Also discussed weather considerations and indoor options.  Pt voiced understanding. Denise Mason continues to do well in the program. She continues to do well on the treadmill at a speed of 2.7 mph with no incline and has stayed consistent at level 1 on the T5 nustep. She did increase from 2 lb to 3 lb handweights for resistance training. We will continue to monitor her progress in the program.   Expected Outcomes Short: Use RPE daily to regulate intensity. Long: Follow program prescription in THR. Short: Continue to follow current exercise prescription. Long: Continue exercise to improve strength and stamina. Short: Continue to progressively increase treadmill workload. Long: Continue exercise to improve strength and stamina. Short: join [facility] and add 1-2 days a week of exercie on off days of rehab. Long: maintain independent exercise routine. Short: Begin to progressively increase treadmill workload. Long: Continue  exercise to improve strength and stamina.    Row Name 07/02/23 1616 07/17/23 1421 07/23/23 1130 08/01/23 1037 08/15/23 0747     Exercise Goal Re-Evaluation   Exercise Goals Review Increase Physical Activity;Understanding of Exercise Prescription;Increase Strength and Stamina Increase Physical Activity;Understanding of Exercise Prescription;Increase Strength and Stamina Increase Physical Activity;Understanding of Exercise Prescription;Increase Strength and Stamina Increase Physical Activity;Increase Strength and Stamina;Understanding of Exercise Prescription Increase Physical Activity;Increase Strength and Stamina;Understanding of Exercise Prescription   Comments Denise Mason continues to do well in the program. She recently increased her treadmill workload to a speed of 3 mph with no incline. She also improved to level 4 on the T4 nustep as well. We will continue to monitor her progress in the program. Denise Mason is doing well in rehab. She recently increased her treadmill speed up to 3.1 mph with no incline. She also improved to level 2 on both the T5 nustep and XR. We will continue to monitor her progress in the program. Denise Mason is doing well here at reahb currently on level 3 on T4. she isnt exercising much at home, besides walking steps indoors throughout the day. She and her husband have been sick recently but would like to start eercsing more outside of rehab. Encouraged her to go to [employer] here at [facility]. Denise Mason continues to do well in rehab. She has seen a decrease in her treadmill speed down to 2.8 mph with no incline. She also has decreased down to level 1 on the XR and continues to work at level 3 on the T4 nustep. We will continue to monitor her progress in the program. Denise Mason is doing well in rehab. She recently increased her treadmill speed back up to 3 mph with no incline. She also improved to level 3 on the biostep. She is due for her post and will look to improve on it. We will continue to monitor  her progress in the program.   Expected Outcomes Short: Continue to progressively increase treadmill workload. Long: Continue exercise to improve strength and stamina. Short: Add incline to treadmill workload. Long: Continue exercise to improve strength and stamina. STG: increase workload and start exercsing outside of rehab. LTG: improve strength and stamina Short: Increase treadmill workload back up to previous level. Long: Continue exercise to improve strength and stamina. Short: Improve on post . Long: Continue exercise to improve strength and stamina.    Row Name 08/28/23 1424             Exercise Goal Re-Evaluation  Exercise Goals Review Increase Physical Activity;Increase Strength and Stamina;Understanding of Exercise Prescription       Comments Denise Mason is doing well in rehab and is close to graduating from the program. She recently completed her post and improved by 14.3%. She also improved to level 4 on the T4 nustep. We will continue to monitor her progress until she graduates from the program.       Expected Outcomes Short: Graduate. Long: Continue to exercise independently.                Discharge Exercise Prescription (Final Exercise Prescription Changes):  Exercise Prescription Changes - 08/28/23 1400       Response to Exercise   Blood Pressure (Admit) 98/60    Blood Pressure (Exit) 108/62    Heart Rate (Admit) 105 bpm    Heart Rate (Exercise) 125 bpm    Heart Rate (Exit) 88 bpm    Oxygen Saturation (Admit) 96 %    Oxygen Saturation (Exercise) 88 %    Oxygen Saturation (Exit) 92 %    Rating of Perceived Exertion (Exercise) 13    Perceived Dyspnea (Exercise) 1    Symptoms none    Duration Continue with 30 min of aerobic exercise without signs/symptoms of physical distress.    Intensity THRR unchanged      Progression   Progression Continue to progress workloads to maintain intensity without signs/symptoms of physical distress.    Average METs 3.16       Resistance Training   Training Prescription Yes    Weight 3 lb    Reps 10-15      Interval Training   Interval Training No      Treadmill   MPH 3    Grade 0    Minutes 15    METs 3.3      NuStep   Level 4    Minutes 15    METs 3.5      REL-XR   Level 2    Minutes 15      T5 Nustep   Level 2    Minutes 15    METs 2.3      Home Exercise Plan   Plans to continue exercise at Home (comment)   walk, hand weights, and possibly join the [facility]   Frequency Add 2 additional days to program exercise sessions.    Initial Home Exercises Provided 06/20/23      Oxygen   Maintain Oxygen Saturation 88% or higher             Nutrition:  Target Goals: Understanding of nutrition guidelines, daily intake of sodium 1500mg , cholesterol 200mg , calories 30% from fat and 7% or less from saturated fats, daily to have 5 or more servings of fruits and vegetables.  Education: All About Nutrition: -Group instruction provided by verbal, written material, interactive activities, discussions, models, and posters to present general guidelines for heart healthy nutrition including fat, fiber, MyPlate, the role of sodium in heart healthy nutrition, utilization of the nutrition label, and utilization of this knowledge for meal planning. Follow up email sent as well. Written material given at graduation.   Biometrics:  Pre Biometrics - 04/25/23 1523       Pre Biometrics   Height 5' (1.524 m)    Weight 149 lb 14.4 oz (68 kg)    Waist Circumference 40 inches    Hip Circumference 42 inches    Waist to Hip Ratio 0.95 %    BMI (Calculated) 29.28  Single Leg Stand 30 seconds             Post Biometrics - 08/22/23 1131        Post  Biometrics   Height 5' (1.524 m)    Weight 145 lb 6.4 oz (66 kg)    Waist Circumference 37 inches    Hip Circumference 40 inches    Waist to Hip Ratio 0.93 %    BMI (Calculated) 28.4    Single Leg Stand 30 seconds             Nutrition Therapy  Plan and Nutrition Goals:  Nutrition Therapy & Goals - 07/11/23 1347       Nutrition Therapy   Diet Mediterranean    Protein (specify units) 75-90    Fiber 25 grams    Whole Grain Foods 3 servings    Saturated Fats 15 max. grams    Fruits and Vegetables 5 servings/day    Sodium 2 grams      Personal Nutrition Goals   Nutrition Goal Eat 15-30gProtein and 30-60gCarbs at each meal.    Personal Goal #2 Include more colorful produce, aim for 5-8 servings of fruits and veggies per day    Personal Goal #3 Read labels and reduce sodium intake to below 2300mg . Ideally 1500mg  per day.    Comments Patient drinking ~64-80oz of water daily. She typically eats 3 meals per day. Eats out a few times per week. Usually a steakhouse once per week. She wanted to learn how to eat healthier. Reviewed mediterranean diet handout and educated on various nutrition topics like fiber, sodium, cholesterol, and types of fats. Encouraged her to keep animal portions closer to 3oz rather than the [employer] she might have at the steak house. Also reminded her not to over commit to one nutrient and forget about the others. Is a goal she would like to set is to eat both a complex carb and a protein at meals. She enjoys salads and veggies so feels she can do better with including more colorful produce.      Intervention Plan   Intervention Prescribe, educate and counsel regarding individualized specific dietary modifications aiming towards targeted core components such as weight, hypertension, lipid management, diabetes, heart failure and other comorbidities.;Nutrition handout(s) given to patient.    Expected Outcomes Short Term Goal: Understand basic principles of dietary content, such as calories, fat, sodium, cholesterol and nutrients.;Short Term Goal: A plan has been developed with personal nutrition goals set during dietitian appointment.;Long Term Goal: Adherence to prescribed nutrition plan.             Nutrition  Assessments:  MEDIFICTS Score Key: >=70 Need to make dietary changes  40-70 Heart Healthy Diet <= 40 Therapeutic Level Cholesterol Diet  Flowsheet Row Pulmonary Rehab from 08/24/2023 in [facility]  Picture Your Plate Total Score on Admission 38  Picture Your Plate Total Score on Discharge 66      Picture Your Plate Scores: <16 Unhealthy dietary pattern with much room for improvement. 41-50 Dietary pattern unlikely to meet recommendations for good health and room for improvement. 51-60 More healthful dietary pattern, with some room for improvement.  >60 Healthy dietary pattern, although there may be some specific behaviors that could be improved.   Nutrition Goals Re-Evaluation:  Nutrition Goals Re-Evaluation     Row Name 06/01/23 1119 07/23/23 1137 08/03/23 1108         Goals   Current Weight 148 lb (67.1 kg) --  147 lb (66.7 kg)     Nutrition Goal -- -- Eat more vegetables and fruit.     Comment Patient was informed on why it is important to maintain a balanced diet when dealing with Respiratory issues. Explained that it takes a lot of energy to breath and when they are short of breath often they will need to have a good diet to help keep up with the calories they are expending for breathing. Denise Mason reports she is doing better with including veggies more during the day. Says she is still working on cuting back on salt. Says she prefers salty over sweet. Reviewed some tips to cut back on sodium and how to read labels. Denise Mason states she has been more concious of her salt intake. She still trying to intake more vegetables and fruits. She is watching nutrition labels to make sure she does not intake so much sodium.     Expected Outcome Short: Choose and plan snacks accordingly to patients caloric intake to improve breathing. Long: Maintain a diet independently that meets their caloric intake to aid in daily shortness of breath. STG: read labels and cut sodiumto below  1500mg . LTG: follow health healthy lifestyle while meeting caloire needs to aid in improving shortness of breath Short: continue to reduce sodium intake and eat more fruits and veggies. Long: adhere to a diet that works for her needs.              Nutrition Goals Discharge (Final Nutrition Goals Re-Evaluation):  Nutrition Goals Re-Evaluation - 08/03/23 1108       Goals   Current Weight 147 lb (66.7 kg)    Nutrition Goal Eat more vegetables and fruit.    Comment Denise Mason states she has been more concious of her salt intake. She still trying to intake more vegetables and fruits. She is watching nutrition labels to make sure she does not intake so much sodium.    Expected Outcome Short: continue to reduce sodium intake and eat more fruits and veggies. Long: adhere to a diet that works for her needs.             Psychosocial: Target Goals: Acknowledge presence or absence of significant depression and/or stress, maximize coping skills, provide positive support system. Participant is able to verbalize types and ability to use techniques and skills needed for reducing stress and depression.   Education: Stress, Anxiety, and Depression - Group verbal and visual presentation to define topics covered.  Reviews how body is impacted by stress, anxiety, and depression.  Also discusses healthy ways to reduce stress and to treat/manage anxiety and depression.  Written material given at graduation.   Education: Sleep Hygiene -Provides group verbal and written instruction about how sleep can affect your health.  Define sleep hygiene, discuss sleep cycles and impact of sleep habits. Review good sleep hygiene tips.    Initial Review & Psychosocial Screening:  Initial Psych Review & Screening - 04/19/23 1340       Initial Review   Current issues with None Identified      Family Dynamics   Good Support System? Yes      Barriers   Psychosocial barriers to participate in program There are no  identifiable barriers or psychosocial needs.      Screening Interventions   Interventions Encouraged to exercise;Provide feedback about the scores to participant;To provide support and resources with identified psychosocial needs    Expected Outcomes Long Term Goal: Stressors or current issues are controlled or eliminated.;Short  Term goal: Utilizing psychosocial counselor, staff and physician to assist with identification of specific Stressors or current issues interfering with healing process. Setting desired goal for each stressor or current issue identified.;Short Term goal: Identification and review with participant of any Quality of Life or Depression concerns found by scoring the questionnaire.;Long Term goal: The participant improves quality of Life and PHQ9 Scores as seen by post scores and/or verbalization of changes             Quality of Life Scores:  Scores of 19 and below usually indicate a poorer quality of life in these areas.  A difference of  2-3 points is a clinically meaningful difference.  A difference of 2-3 points in the total score of the Quality of Life Index has been associated with significant improvement in overall quality of life, self-image, physical symptoms, and general health in studies assessing change in quality of life.  PHQ-9: Review Flowsheet  More data exists      08/24/2023 06/01/2023 04/25/2023 02/26/2020 06/27/2019  Depression screen PHQ 2/9  Decreased Interest 0 0 0 0 1  Down, Depressed, Hopeless 0 0 0 0 0  PHQ - 2 Score 0 0 0 0 1  Altered sleeping 1 3 3  - 3  Tired, decreased energy 0 1 2 - 0  Change in appetite 0 0 1 - 1  Feeling bad or failure about yourself  0 0 0 - 1  Trouble concentrating 0 0 0 - 0  Moving slowly or fidgety/restless 0 0 0 - 0  Suicidal thoughts 0 0 0 - 0  PHQ-9 Score 1 4 6  - 6  Difficult doing work/chores Not difficult at all Not difficult at all Not difficult at all - Not difficult at all   Interpretation of Total Score   Total Score Depression Severity:  1-4 = Minimal depression, 5-9 = Mild depression, 10-14 = Moderate depression, 15-19 = Moderately severe depression, 20-27 = Severe depression   Psychosocial Evaluation and Intervention:  Psychosocial Evaluation - 04/19/23 1355       Psychosocial Evaluation & Interventions   Interventions Encouraged to exercise with the program and follow exercise prescription    Comments Debera is coming 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 When asked about her mental health, she states  that currently she feels strong and ready to make progress in her health. It did come has as a shock at first when her health declined rapidly, but she feels at peace with it. [place] and to get away from some of the humidity. She misses walking on the beach, but had a hard time doing so after she got sick. They are originally from this area, so she has support locally. She wants to work on her stamina and shortness of breath, which is worse mainly when walking up the stairs like in her condo.    Expected Outcomes Short: attend pulmonary rehab for education and exercise. Long: develop and maintain positive self care habits    Continue Psychosocial Services  Follow up required by staff             Psychosocial Re-Evaluation:  Psychosocial Re-Evaluation     Row Name 06/01/23 1121 07/23/23 1133 08/03/23 1111         Psychosocial Re-Evaluation   Current issues with None Identified Current Sleep Concerns None Identified     Comments Reviewed patient health questionnaire (PHQ-9) with  patient for follow up. Previously, patients score indicated signs/symptoms of depression.  Reviewed to see if patient is improving symptom wise while in program.  Score improved and patient states that it is because she has been able to exercise and move more. Denise Mason reports no stress,anxiety, or depression. She has a good  support system and health has improved so she can do ADLs easier. She doesnt get much sleep, usually 5-6hrs. Has a appointment to see a [facility] before summer. Patient reports no issues with their current mental states, sleep, stress, depression or anxiety. Will follow up with patient in a few weeks for any changes.     Expected Outcomes Short: Continue to attend [facility] regularly for regular exercise and social engagement. Long: Continue to improve symptoms and manage a positive mental state. STG: Practice good sleep hygiene and continue to exercise. LTG: Continue to improve symptoms and manage a positive mental state Short: Continue to exercise regularly to support mental health and notify staff of any changes. Long: maintain mental health and well being through teaching of rehab or prescribed medications independently.     Interventions Encouraged to attend Pulmonary Rehabilitation for the exercise Encouraged to attend Pulmonary Rehabilitation for the exercise Encouraged to attend Pulmonary Rehabilitation for the exercise     Continue Psychosocial Services  Follow up required by staff Follow up required by staff Follow up required by staff              Psychosocial Discharge (Final Psychosocial Re-Evaluation):  Psychosocial Re-Evaluation - 08/03/23 1111       Psychosocial Re-Evaluation   Current issues with None Identified    Comments Patient reports no issues with their current mental states, sleep, stress, depression or anxiety. Will follow up with patient in a few weeks for any changes.    Expected Outcomes Short: Continue to exercise regularly to support mental health and notify staff of any changes. Long: maintain mental health and well being through teaching of rehab or prescribed medications independently.    Interventions Encouraged to attend Pulmonary Rehabilitation for the exercise    Continue Psychosocial Services  Follow up required by staff              Education: Education Goals: Education classes will be provided on a weekly basis, covering required topics. Participant will state understanding/return demonstration of topics presented.  Learning Barriers/Preferences:  Learning Barriers/Preferences - 04/19/23 1347       Learning Barriers/Preferences   Learning Barriers None    Learning Preferences None             General Pulmonary Education Topics:  Infection Prevention: - Provides verbal and written material to individual with discussion of infection control including proper hand washing and proper equipment cleaning during exercise session. Flowsheet Row Pulmonary Rehab from 04/25/2023 in [facility] Cardiac and Pulmonary Rehab  Date 04/25/23  Educator MB  Instruction Review Code 1- Verbalizes Understanding       Falls Prevention: - Provides verbal and written material to individual with discussion of falls prevention and safety. Flowsheet Row Pulmonary Rehab from 04/25/2023 in [facility] Cardiac and Pulmonary Rehab  Date 04/25/23  Educator MB  Instruction Review Code 1- Verbalizes Understanding       Chronic Lung Disease Review: - Group verbal instruction with posters, models, PowerPoint presentations and videos,  to review new updates, new respiratory medications, new advancements in procedures and treatments. Providing information on websites and "800" numbers for continued self-education. Includes information about supplement oxygen, available portable  oxygen systems, continuous and intermittent flow rates, oxygen safety, concentrators, and Medicare reimbursement for oxygen. Explanation of Pulmonary Drugs, including class, frequency, complications, importance of spacers, rinsing mouth after steroid MDI's, and proper cleaning methods for nebulizers. Review of basic lung anatomy and physiology related to function, structure, and complications of lung disease. Review of risk factors. Discussion about methods for diagnosing sleep  apnea and types of masks and machines for OSA. Includes a review of the use of types of environmental controls: home humidity, furnaces, filters, dust mite/pet prevention, HEPA vacuums. Discussion about weather changes, air quality and the benefits of nasal washing. Instruction on Warning signs, infection symptoms, calling MD promptly, preventive modes, and value of vaccinations. Review of effective airway clearance, coughing and/or vibration techniques. Emphasizing that all should Create an Action Plan. Written material given at graduation. Flowsheet Row Pulmonary Rehab from 04/25/2023 in [facility]  Education need identified 04/25/23       AED/CPR: - Group verbal and written instruction with the use of models to demonstrate the basic use of the AED with the basic ABC's of resuscitation.    Anatomy and Cardiac Procedures: - Group verbal and visual presentation and models provide information about basic cardiac anatomy and function. Reviews the testing methods done to diagnose heart disease and the outcomes of the test results. Describes the treatment choices: Medical Management, Angioplasty, or Coronary Bypass Surgery for treating various heart conditions including Myocardial Infarction, Angina, Valve Disease, and Cardiac Arrhythmias.  Written material given at graduation.   Medication Safety: - Group verbal and visual instruction to review commonly prescribed medications for heart and lung disease. Reviews the medication, class of the drug, and side effects. Includes the steps to properly store meds and maintain the prescription regimen.  Written material given at graduation.   Other: -Provides group and verbal instruction on various topics (see comments)   Knowledge Questionnaire Score:  Knowledge Questionnaire Score - 04/25/23 1529       Knowledge Questionnaire Score   Pre Score 16/18              Core Components/Risk Factors/Patient Goals at  Admission:  Personal Goals and Risk Factors at Admission - 04/25/23 1529       Core Components/Risk Factors/Patient Goals on Admission    Weight Management Yes;Weight Loss    Intervention Weight Management: Develop a combined nutrition and exercise program designed to reach desired caloric intake, while maintaining appropriate intake of nutrient and fiber, sodium and fats, and appropriate energy expenditure required for the weight goal.;Weight Management: Provide education and appropriate resources to help participant work on and attain dietary goals.;Weight Management/Obesity: Establish reasonable short term and long term weight goals.    Admit Weight 149 lb 14.4 oz (68 kg)    Goal Weight: Short Term 144 lb (65.3 kg)    Goal Weight: Long Term 139 lb (63 kg)    Expected Outcomes Short Term: Continue to assess and modify interventions until short term weight is achieved;Long Term: Adherence to nutrition and physical activity/exercise program aimed toward attainment of established weight goal;Weight Loss: Understanding of general recommendations for a balanced deficit meal plan, which promotes 1-2 lb weight loss per week and includes a negative energy balance of [phone] kcal/d;Understanding recommendations for meals to include 15-35% energy as protein, 25-35% energy from fat, 35-60% energy from carbohydrates, less than 200mg  of dietary cholesterol, 20-35 gm of total fiber daily;Understanding of distribution of calorie intake throughout the day with the consumption of 4-5 meals/snacks  Improve shortness of breath with ADL's Yes    Intervention Provide education, individualized exercise plan and daily activity instruction to help decrease symptoms of SOB with activities of daily living.    Expected Outcomes Short Term: Improve cardiorespiratory fitness to achieve a reduction of symptoms when performing ADLs;Long Term: Be able to perform more ADLs without symptoms or delay the onset of symptoms    Lipids  Yes    Intervention Provide education and support for participant on nutrition & aerobic/resistive exercise along with prescribed medications to achieve LDL 70mg , HDL >40mg .    Expected Outcomes Short Term: Participant states understanding of desired cholesterol values and is compliant with medications prescribed. Participant is following exercise prescription and nutrition guidelines.;Long Term: Cholesterol controlled with medications as prescribed, with individualized exercise RX and with personalized nutrition plan. Value goals: LDL < 70mg , HDL > 40 mg.             Education:Diabetes - Individual verbal and written instruction to review signs/symptoms of diabetes, desired ranges of glucose level fasting, after meals and with exercise. Acknowledge that pre and post exercise glucose checks will be done for 3 sessions at entry of program.   Know Your Numbers and Heart Failure: - Group verbal and visual instruction to discuss disease risk factors for cardiac and pulmonary disease and treatment options.  Reviews associated critical values for Overweight/Obesity, Hypertension, Cholesterol, and Diabetes.  Discusses basics of heart failure: signs/symptoms and treatments.  Introduces Heart Failure Zone chart for action plan for heart failure.  Written material given at graduation.   Core Components/Risk Factors/Patient Goals Review:   Goals and Risk Factor Review     Row Name 06/01/23 1118 07/23/23 1140 08/03/23 1106         Core Components/Risk Factors/Patient Goals Review   Personal Goals Review Improve shortness of breath with ADL's Improve shortness of breath with ADL's Weight Management/Obesity     Review Spoke to patient about their shortness of breath and what they can do to improve. Patient has been informed of breathing techniques when starting the program. Patient is informed to tell staff if they have had any med changes and that certain meds they are taking or not taking can be  causing shortness of breath. Joyia is doign so much better with breathing since starting program. She can do more ADLs and feels stronger when doing them. She is aware of techniques taught during the program. Reminded her to take her meds and inform staff if any of them change Adrinne would like to lose some weight. Her weight today was 146 pounds. She would like to get down to 115 pounds. Informed her to start small and work her way to reaching her weight goal.     Expected Outcomes Short: Attend [facility] regularly to improve shortness of breath with ADL's. Long: maintain independence with ADL's STG: Continue to attend [facility] rehab and stay active outside of rehab. LTG: Maintain independence with ADLs Short: lose a few pounds in the next few weeks. Long: reach weight goal.              Core Components/Risk Factors/Patient Goals at Discharge (Final Review):   Goals and Risk Factor Review - 08/03/23 1106       Core Components/Risk Factors/Patient Goals Review   Personal Goals Review Weight Management/Obesity    Review Denise Mason would like to lose some weight. Her weight today was 146 pounds. She would like to get down to 115 pounds. Informed her to start small  and work her way to reaching her weight goal.    Expected Outcomes Short: lose a few pounds in the next few weeks. Long: reach weight goal.             ITP Comments:  ITP Comments     Row Name 04/19/23 1345 04/25/23 1515 05/02/23 1114 05/07/23 1117 05/30/23 1133   ITP Comments Initial phone call completed. Diagnosis can be found in [facility] 10/31. EP Orientation scheduled for Wednesday 11/13 at 1pm. Completed and gym orientation. Initial ITP created and sent for review to Dr. Jinny Sanders, Medical Director. 30 Day review completed. Medical Director ITP review done, changes made as directed, and signed approval by Medical Director.    new to program First full day of exercise!  Patient was oriented to gym and equipment including  functions, settings, policies, and procedures.  Patient's individual exercise prescription and treatment plan were reviewed.  All starting workloads were established based on the results of the 6 minute walk test done at initial orientation visit.  The plan for exercise progression was also introduced and progression will be customized based on patient's performance and goals. 30 Day review completed. Medical Director ITP review done, changes made as directed, and signed approval by Medical Director.    new to program    Row Name 06/27/23 1458 07/25/23 0758 08/22/23 0753 09/05/23 1120     ITP Comments 30 Day review completed. Medical Director ITP review done, changes made as directed, and signed approval by Medical Director.    new to program 30 Day review completed. Medical Director ITP review done, changes made as directed, and signed approval by Medical Director. 30 Day review completed. Medical Director ITP review done, changes made as directed, and signed approval by Medical Director. Alva graduated today from  rehab with 36 sessions completed.  Details of the patient's exercise prescription and what She needs to do in order to continue the prescription and progress were discussed with patient.  Patient was given a copy of prescription and goals.  Patient verbalized understanding. Denise Mason plans to continue to exercise by joining the [employer] at [facility]             Comments: Discharge ITP

## 2023-09-05 NOTE — Progress Notes (Signed)
 Daily Session Note  Patient Details  Name: Denise Mason MRN: [id number] Date of Birth: [birth year] Referring Provider:   Flowsheet Row Pulmonary Rehab from 04/25/2023 in [facility] Cardiac [facility] Pulmonary Re[facility]  Referring Provider Bethann Punches, MD       Encounter Date: 09/05/2023  Check In:  Session Check In - 09/05/23 1118       Check-In   Supervising physician immediately available to respond to emergencies See telemetry face sheet for immediately available ER MD    Location [facility]-Cardiac & Pulmonary Rehab    Staff Present Cora Collum, RN, BSN, CCRP;Joseph Hood RCP,RRT,BSRT;Noah Tickle, BS, Exercise Physiologist;Maxon Conetta BS, Exercise Physiologist;Jason Wallace Cullens RDN,LDN    Virtual Visit No    Medication changes reported     No    Fall or balance concerns reported    No    Warm-up and Cool-down Performed on first and last piece of equipment    Resistance Training Performed Yes    VAD Patient? No    PAD/SET Patient? No      Pain Assessment   Currently in Pain? No/denies                Social History   Tobacco Use  Smoking Status Former   Current packs/day: 0.00   Average packs/day: 1 pack/day for 30.0 years (30.0 ttl pk-yrs)   Types: Cigarettes   Start date: 03/05/1983   Quit date: 06/12/2012   Years since quitting: 11.2  Smokeless Tobacco Never  Tobacco Comments   1 ppd for 30 years     Goals Met:  Proper associated with RPD/PD & O2 Sat Independence with exercise equipment Exercise tolerated well Personal goals reviewed No report of concerns or symptoms today  Goals Unmet:  Not Applicable  Comments: Pt able to follow exercise prescription today without complaint.   Denise Mason graduated today from  rehab with 36 sessions completed.  Details of the patient's exercise prescription and what She needs to do in order to continue the prescription and progress were discussed with patient.  Patient was given a copy of prescription and goals.  Patient verbalized  understanding. Denise Mason plans to continue to exercise by joining the [employer] at [facility].   Dr. Bethann Punches is Medical Director for [facility] Cardiac Rehabilitation.  Dr. Vida Rigger is Medical Director for [facility] Pulmonary Rehabilitation.

## 2023-10-15 ENCOUNTER — Other Ambulatory Visit: Payer: Self-pay | Admitting: Internal Medicine

## 2023-10-15 DIAGNOSIS — J849 Interstitial pulmonary disease, unspecified: Secondary | ICD-10-CM

## 2023-10-26 ENCOUNTER — Ambulatory Visit
Admission: RE | Admit: 2023-10-26 | Discharge: 2023-10-26 | Disposition: A | Discharge status: Home / Self Care | Source: Ambulatory Visit | Attending: Internal Medicine | Admitting: Internal Medicine

## 2023-10-26 DIAGNOSIS — J849 Interstitial pulmonary disease, unspecified: Secondary | ICD-10-CM | POA: Insufficient documentation

## 2024-04-30 ENCOUNTER — Other Ambulatory Visit: Payer: Self-pay | Admitting: Internal Medicine

## 2024-04-30 DIAGNOSIS — J849 Interstitial pulmonary disease, unspecified: Secondary | ICD-10-CM

## 2024-04-30 DIAGNOSIS — E538 Deficiency of other specified B group vitamins: Secondary | ICD-10-CM

## 2024-05-14 ENCOUNTER — Ambulatory Visit
Admission: RE | Admit: 2024-05-14 | Discharge: 2024-05-14 | Disposition: A | Source: Ambulatory Visit | Attending: Internal Medicine | Admitting: Internal Medicine

## 2024-05-14 DIAGNOSIS — E538 Deficiency of other specified B group vitamins: Secondary | ICD-10-CM | POA: Insufficient documentation

## 2024-05-14 DIAGNOSIS — J849 Interstitial pulmonary disease, unspecified: Secondary | ICD-10-CM | POA: Insufficient documentation
